# Patient Record
Sex: Male | Born: 1955 | Race: White | Hispanic: No | Marital: Married | Smoking: Former smoker
Health system: Southern US, Community
[De-identification: ages and names within clinical notes are randomized; demographics above are authoritative.]

## PROBLEM LIST (undated history)

## (undated) DIAGNOSIS — I4891 Unspecified atrial fibrillation: Secondary | ICD-10-CM

## (undated) DIAGNOSIS — R569 Unspecified convulsions: Secondary | ICD-10-CM

## (undated) DIAGNOSIS — I519 Heart disease, unspecified: Secondary | ICD-10-CM

## (undated) DIAGNOSIS — F0390 Unspecified dementia without behavioral disturbance: Secondary | ICD-10-CM

## (undated) DIAGNOSIS — E878 Other disorders of electrolyte and fluid balance, not elsewhere classified: Secondary | ICD-10-CM

## (undated) HISTORY — DX: Heart disease, unspecified: I51.9

## (undated) HISTORY — DX: Other disorders of electrolyte and fluid balance, not elsewhere classified: E87.8

## (undated) HISTORY — PX: PACEMAKER IMPLANT: EP1218

## (undated) HISTORY — DX: Unspecified atrial fibrillation: I48.91

---

## 2013-11-08 ENCOUNTER — Encounter (INDEPENDENT_AMBULATORY_CARE_PROVIDER_SITE_OTHER): Payer: Self-pay | Admitting: *Deleted

## 2014-11-06 ENCOUNTER — Other Ambulatory Visit (HOSPITAL_COMMUNITY): Payer: Self-pay | Admitting: Family Medicine

## 2014-11-06 ENCOUNTER — Ambulatory Visit (HOSPITAL_COMMUNITY)
Admission: RE | Admit: 2014-11-06 | Discharge: 2014-11-06 | Disposition: A | Discharge status: Home / Self Care | Payer: Medicare HMO | Source: Ambulatory Visit | Attending: Family Medicine | Admitting: Family Medicine

## 2014-11-06 DIAGNOSIS — M545 Low back pain, unspecified: Secondary | ICD-10-CM

## 2014-11-06 DIAGNOSIS — G8929 Other chronic pain: Secondary | ICD-10-CM

## 2014-11-09 ENCOUNTER — Encounter (INDEPENDENT_AMBULATORY_CARE_PROVIDER_SITE_OTHER): Payer: Self-pay | Admitting: *Deleted

## 2016-05-19 ENCOUNTER — Ambulatory Visit (INDEPENDENT_AMBULATORY_CARE_PROVIDER_SITE_OTHER): Payer: Medicare HMO | Admitting: Otolaryngology

## 2016-05-19 DIAGNOSIS — H903 Sensorineural hearing loss, bilateral: Secondary | ICD-10-CM

## 2016-05-19 DIAGNOSIS — H9313 Tinnitus, bilateral: Secondary | ICD-10-CM

## 2017-04-22 ENCOUNTER — Other Ambulatory Visit (HOSPITAL_COMMUNITY): Payer: Self-pay | Admitting: Family Medicine

## 2017-04-22 DIAGNOSIS — M545 Low back pain: Secondary | ICD-10-CM

## 2017-04-28 ENCOUNTER — Encounter (HOSPITAL_COMMUNITY): Payer: Self-pay

## 2017-04-28 ENCOUNTER — Ambulatory Visit (HOSPITAL_COMMUNITY): Payer: Medicare HMO

## 2017-05-25 ENCOUNTER — Ambulatory Visit (INDEPENDENT_AMBULATORY_CARE_PROVIDER_SITE_OTHER): Payer: Medicare HMO | Admitting: Otolaryngology

## 2017-06-17 ENCOUNTER — Other Ambulatory Visit (HOSPITAL_COMMUNITY): Payer: Self-pay | Admitting: Family Medicine

## 2017-06-17 DIAGNOSIS — M5441 Lumbago with sciatica, right side: Secondary | ICD-10-CM

## 2017-06-17 DIAGNOSIS — M5442 Lumbago with sciatica, left side: Principal | ICD-10-CM

## 2017-07-03 ENCOUNTER — Ambulatory Visit (HOSPITAL_COMMUNITY)
Admission: RE | Admit: 2017-07-03 | Discharge: 2017-07-03 | Disposition: A | Discharge status: Home / Self Care | Payer: Medicare HMO | Source: Ambulatory Visit | Attending: Family Medicine | Admitting: Family Medicine

## 2017-07-03 ENCOUNTER — Encounter (HOSPITAL_COMMUNITY): Payer: Self-pay

## 2017-07-03 DIAGNOSIS — M5442 Lumbago with sciatica, left side: Secondary | ICD-10-CM | POA: Insufficient documentation

## 2017-07-03 DIAGNOSIS — M5441 Lumbago with sciatica, right side: Secondary | ICD-10-CM | POA: Diagnosis present

## 2017-07-03 DIAGNOSIS — M4854XA Collapsed vertebra, not elsewhere classified, thoracic region, initial encounter for fracture: Secondary | ICD-10-CM | POA: Diagnosis not present

## 2017-07-03 MED ORDER — IOPAMIDOL (ISOVUE-300) INJECTION 61%
100.0000 mL | Freq: Once | INTRAVENOUS | Status: AC | PRN
  Administered 2017-07-03: 100 mL via INTRAVENOUS

## 2018-08-21 ENCOUNTER — Telehealth: Payer: Self-pay

## 2018-08-21 DIAGNOSIS — Z20822 Contact with and (suspected) exposure to covid-19: Secondary | ICD-10-CM

## 2018-08-21 NOTE — Telephone Encounter (Signed)
Pt given appt for [facility] 08/23/18 @1300. Pt instructed on address, to wear mask to appt and stay in car.  

## 2018-08-23 ENCOUNTER — Other Ambulatory Visit: Payer: Medicare HMO

## 2018-09-12 ENCOUNTER — Telehealth: Payer: Self-pay

## 2018-09-12 DIAGNOSIS — Z20822 Contact with and (suspected) exposure to covid-19: Secondary | ICD-10-CM

## 2018-09-12 NOTE — Telephone Encounter (Signed)
Pt wanting testing Order placed.

## 2018-09-14 ENCOUNTER — Other Ambulatory Visit: Payer: Self-pay | Admitting: Internal Medicine

## 2018-09-14 DIAGNOSIS — Z20822 Contact with and (suspected) exposure to covid-19: Secondary | ICD-10-CM

## 2018-09-15 LAB — NOVEL CORONAVIRUS, NAA: SARS-CoV-2, NAA: NOT DETECTED

## 2019-06-20 ENCOUNTER — Other Ambulatory Visit: Payer: Self-pay

## 2019-06-20 ENCOUNTER — Encounter: Payer: Self-pay | Admitting: Urology

## 2019-06-20 ENCOUNTER — Ambulatory Visit (INDEPENDENT_AMBULATORY_CARE_PROVIDER_SITE_OTHER): Payer: Medicare HMO | Admitting: Urology

## 2019-06-20 VITALS — BP 126/77 | HR 73 | Temp 98.2°F | Ht 72.0 in | Wt 195.0 lb

## 2019-06-20 DIAGNOSIS — R972 Elevated prostate specific antigen [PSA]: Secondary | ICD-10-CM | POA: Diagnosis not present

## 2019-06-20 NOTE — Progress Notes (Signed)
   06/20/2019 11:39 AM   Gerlene Burdock Heinz Knuckles [birth year] [id number]  Referring provider: Oval Linsey, MD [id number] [place],  [place] [address]  Elevated PSA  HPI: Mr Jonathan Poole is a 64yo here for evaluation of elevated PSA. PSA 6.4 this year. Patient can not recall previous PSA values. No significant LUTS. No dysuria or hematuria. No family hx of prostate cancer.  Patient has a pacemaker. He is not on blood thinner.    PMH: Past Medical History:  Diagnosis Date  . Heart disease   . Hyperchloremia     Surgical History: Past Surgical History:  Procedure Laterality Date  . PACEMAKER IMPLANT      Home Medications:  Allergies as of 06/20/2019   No Known Allergies     Medication List    as of Jun 20, 2019 11:39 AM   You have not been prescribed any medications.     Allergies: No Known Allergies  Family History: Family History  Problem Relation Age of Onset  . Prostate cancer Father   . Heart disease Father     Social History:  reports that he quit smoking about 20 years ago. He has a 15.00 pack-year smoking history. He has never used smokeless tobacco. He reports current drug use. Drug: Marijuana. No history on file for alcohol.  ROS: All other review of systems were reviewed and are negative except what is noted above in HPI  Physical Exam: BP 126/77   Pulse 73   Temp 98.2 F (36.8 C)   Ht 6' (1.829 m)   Wt 195 lb (88.5 kg)   BMI 26.45 kg/m   Constitutional:  Alert and oriented, No acute distress. HEENT: Meta AT, moist mucus membranes.  Trachea midline, no masses. Cardiovascular: No clubbing, cyanosis, or edema. Respiratory: Normal respiratory effort, no increased work of breathing. GI: Abdomen is soft, nontender, nondistended, no abdominal masses GU: No CVA tenderness. Circumcised phallus. No masses/lesions on penis, testis, scrotum. Prostate 50g smooth no nodules no induration.  Lymph: No cervical or inguinal lymphadenopathy. Skin: No rashes,  bruises or suspicious lesions. Neurologic: Grossly intact, no focal deficits, moving all 4 extremities. Psychiatric: Normal mood and affect.  Laboratory Data: No results found for: WBC, HGB, HCT, MCV, PLT  No results found for: CREATININE  No results found for: PSA  No results found for: TESTOSTERONE  No results found for: HGBA1C  Urinalysis No results found for: COLORURINE, APPEARANCEUR, LABSPEC, PHURINE, GLUCOSEU, HGBUR, BILIRUBINUR, KETONESUR, PROTEINUR, UROBILINOGEN, NITRITE, LEUKOCYTESUR  No results found for: LABMICR, WBCUA, RBCUA, LABEPIT, MUCUS, BACTERIA  Pertinent Imaging:  No results found for this or any previous visit. No results found for this or any previous visit. No results found for this or any previous visit. No results found for this or any previous visit. No results found for this or any previous visit. No results found for this or any previous visit. No results found for this or any previous visit. No results found for this or any previous visit.  Assessment & Plan:    1. Elevated PSA -We discussed the natural history of elevated PSA and the various causes. We discussed prostate biopsy versus PSA surveillance and the patient elects for PSa surveillance. RTC 6 months for free and total PSA    No follow-ups on file.  Wilkie Aye, MD  [facility]

## 2019-06-20 NOTE — Patient Instructions (Signed)
Transrectal Ultrasound-Guided Prostate Biopsy This is a procedure to take samples of tissue from your prostate. Ultrasound images are used to guide the procedure. It is usually done to check for prostate cancer. What happens before the procedure? Staying hydrated Follow instructions about liquids. These may include:  Up to 2 hours before the procedure - you may drink clear liquids. This includes water, clear fruit juice, black coffee, and plain tea.  Eating and drinking restrictions Follow instructions about eating and drinking. These may include:  8 hours before the procedure - stop eating heavy meals or foods. This includes meat, fried foods, or fatty foods.  6 hours before the procedure - stop eating light meals or foods. This includes toast or cereal.  6 hours before the procedure - stop drinking milk or drinks that have milk.  2 hours before the procedure - stop drinking clear liquids. Medicines Ask your doctor about:  Changing or stopping your normal medicines. This is important if you take diabetes medicines or blood thinners.  Taking over-the-counter medicines, vitamins, herbs, and supplements.  Taking medicines such as aspirin and ibuprofen. These medicines can thin your blood. Do not take these medicines unless your doctor tells you to take them. General instructions  You may be given antibiotic medicine. If so, take it as told by your doctor.  Liquid will be used to clear waste from your butt (enema).  You may have a blood sample taken.  You may have a pee (urine) sample taken.  Plan to have someone take you home after your procedure. What happens during the procedure?   To lower your risk of infection: ? Your health care team will wash or sanitize their hands. ? Hair may be removed from the area. ? Your skin will be cleaned with soap. ? You will be given antibiotics.  An IV will be placed into one of your veins.  You will be given one or both of these: ? A  medicine to help you relax. ? A medicine to numb the area.  You will lie on your left side. Your knees will be bent.  A probe with gel on it will be placed in your butt. Pictures will be taken of your prostate and the area around it.  Medicine will be used to numb your prostate.  A needle will be placed in your butt and moved to your prostate.  Prostate tissue will be removed.  The samples will be sent to a lab. The procedure may vary. What happens after the procedure?  You will be watched until the medicines you were given have worn off.  You may have some pain in your butt. You will be given medicine for it. Summary  This procedure is usually done to check for prostate cancer.  Before the procedure, ask your doctor about changing or stopping your medicines.  You may have some pain in your butt. You will be given medicine for it.  Plan to have someone take you home after the procedure. This information is not intended to replace advice given to you by your health care provider. Make sure you discuss any questions you have with your health care provider. Document Revised: 05/19/2018 Document Reviewed: 04/25/2016 Elsevier Patient Education  2020 Elsevier Inc.  

## 2019-06-20 NOTE — Progress Notes (Signed)
Urological Symptom Review  Patient is experiencing the following symptoms: Getting up at night    Review of Systems  Gastrointestinal (upper)  : Negative for upper GI symptoms  Gastrointestinal (lower) : Negative for lower GI symptoms  Constitutional : Negative for symptoms  Skin: Negative for skin symptoms  Eyes: Negative for eye symptoms  Ear/Nose/Throat : Negative for Ear/Nose/Throat symptoms  Hematologic/Lymphatic: Negative for Hematologic/Lymphatic symptoms  Cardiovascular : Negative for cardiovascular symptoms  Respiratory : Negative for respiratory symptoms  Endocrine: Negative for endocrine symptoms  Musculoskeletal: Back pain Joint pain  Neurological: Negative for neurological symptoms  Psychologic: Negative for psychiatric symptoms

## 2019-06-25 IMAGING — CT CT L SPINE W/ CM
3 series · 10 of 33 positions shown, 11 images · IV contrast (iopamidol)
Comparison: Radiographs 11/06/2014

CLINICAL DATA: No hx of contrast Allergy, Not diabetic, no kidney
disease Chronic back pain with pain down both legs

EXAM:
CT LUMBAR SPINE WITH CONTRAST
TECHNIQUE: Multidetector CT imaging of the lumbar spine was performed with
intravenous contrast administration.
CONTRAST:  100mL KHCTPA-6WW IOPAMIDOL (KHCTPA-6WW) INJECTION 61%

[Series 5: l spine soft · axial · 0.29mm/px · z∈[+971,+1109]mm · 2 of 150 slices shown, 3 images]
[im 46/150  soft-tissue]
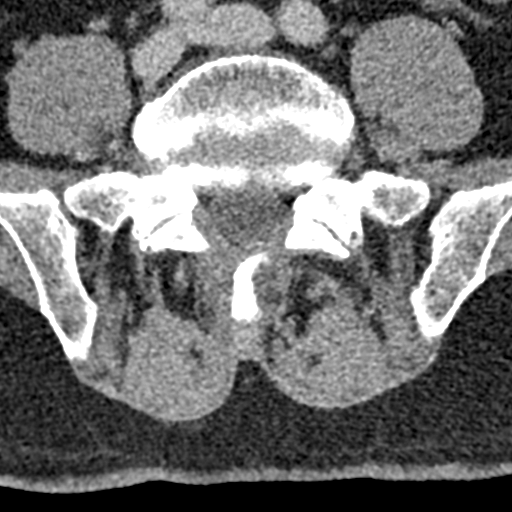
[im 46/150  bone]
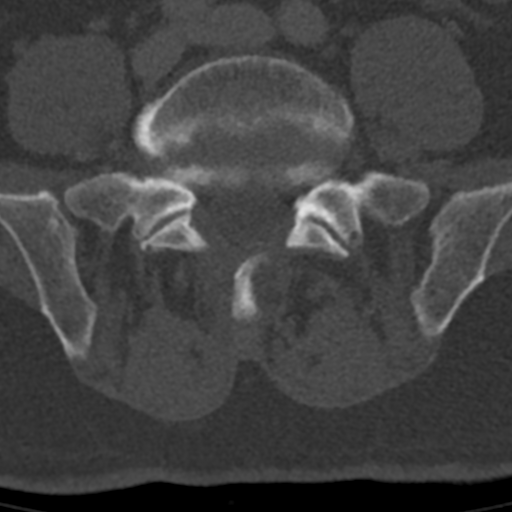
[im 115/150  bone]
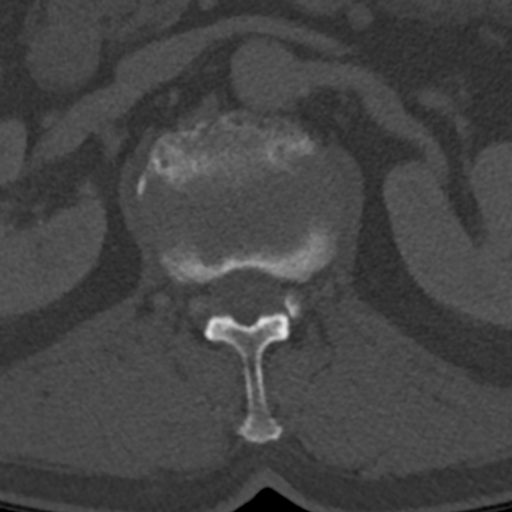

[Series 9: sagittal bone · sagittal · 0.32mm/px · 5 of 61 slices shown]
[im 21/61  bone]
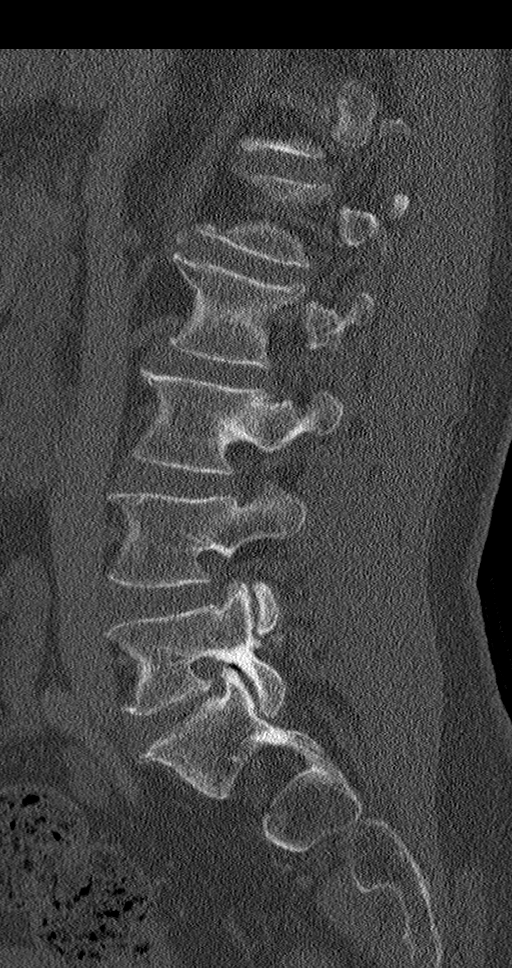
[im 26/61  bone]
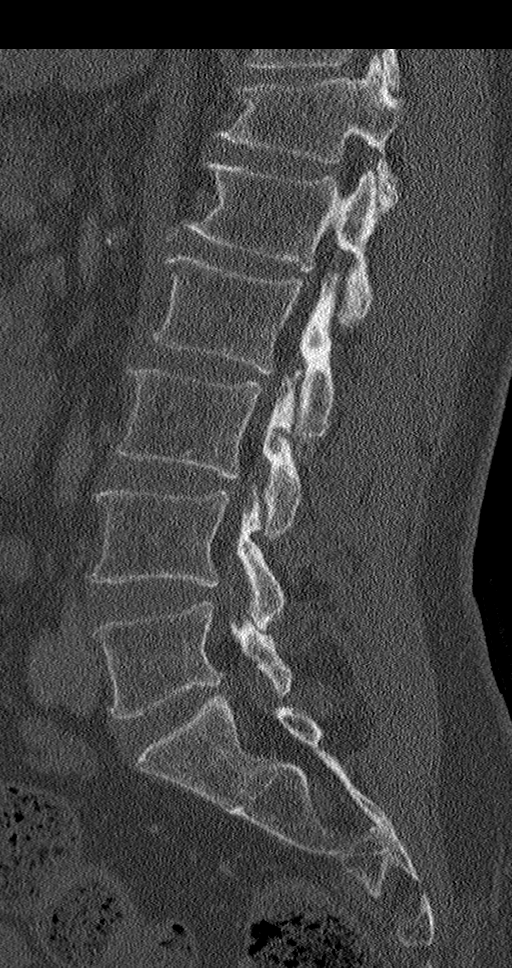
[im 31/61  bone]
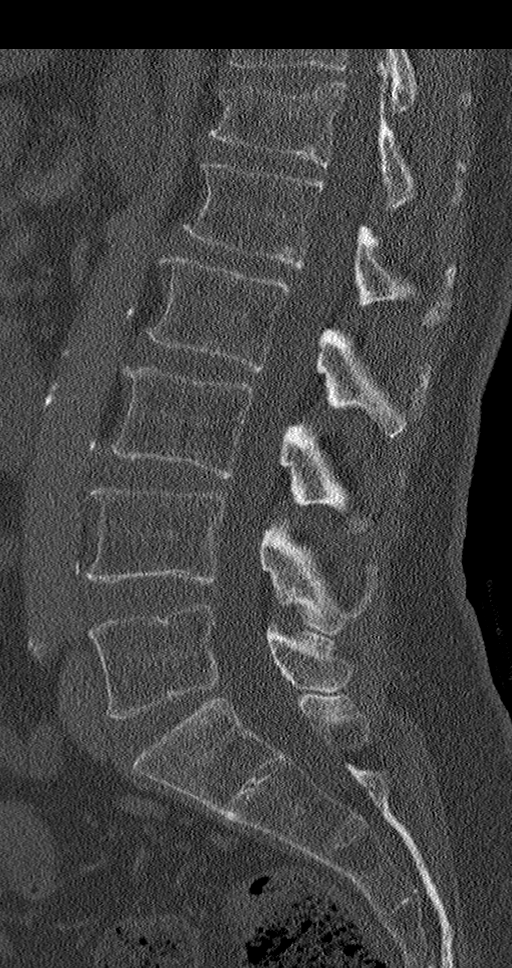
[im 36/61  bone]
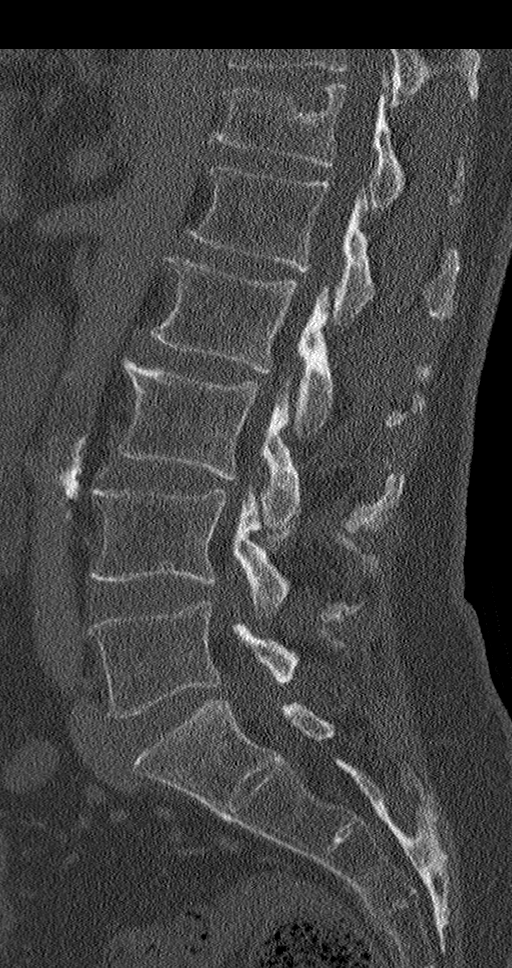
[im 41/61  bone]
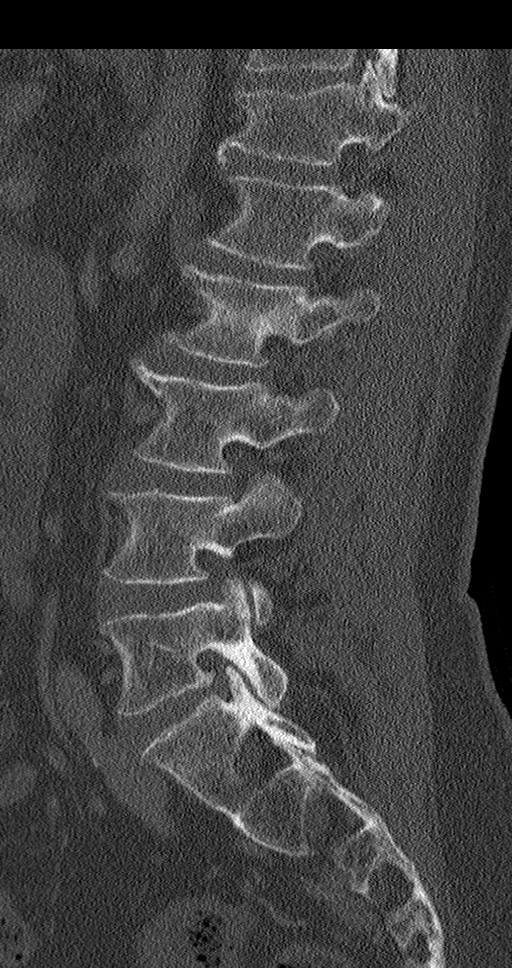

[Series 10: coronal bone · coronal · 0.23mm/px · 3 of 70 slices shown]
[im 14/70  bone]
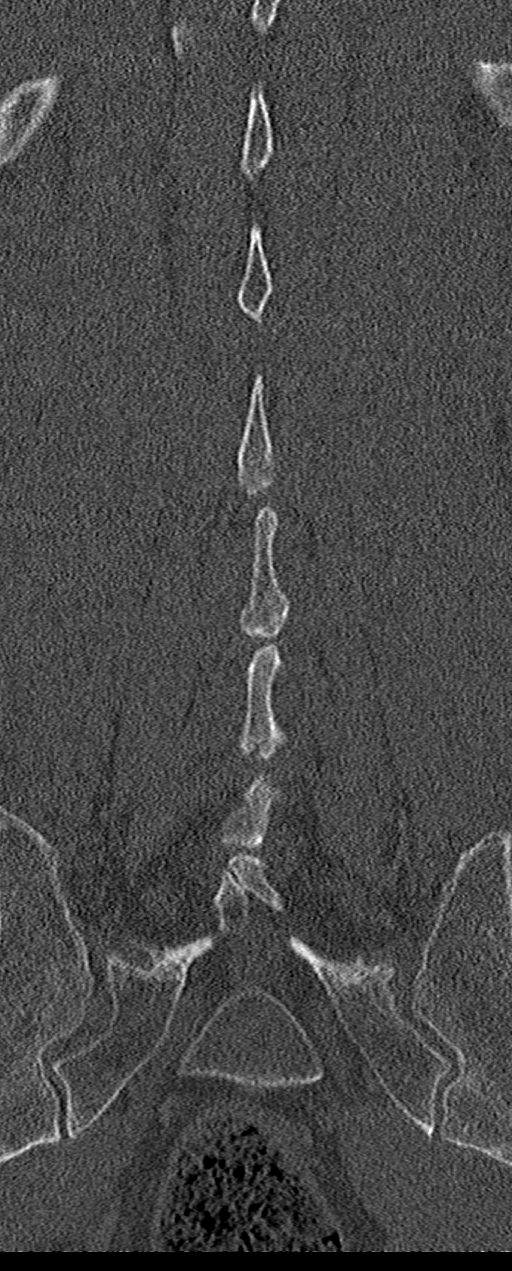
[im 28/70  bone]
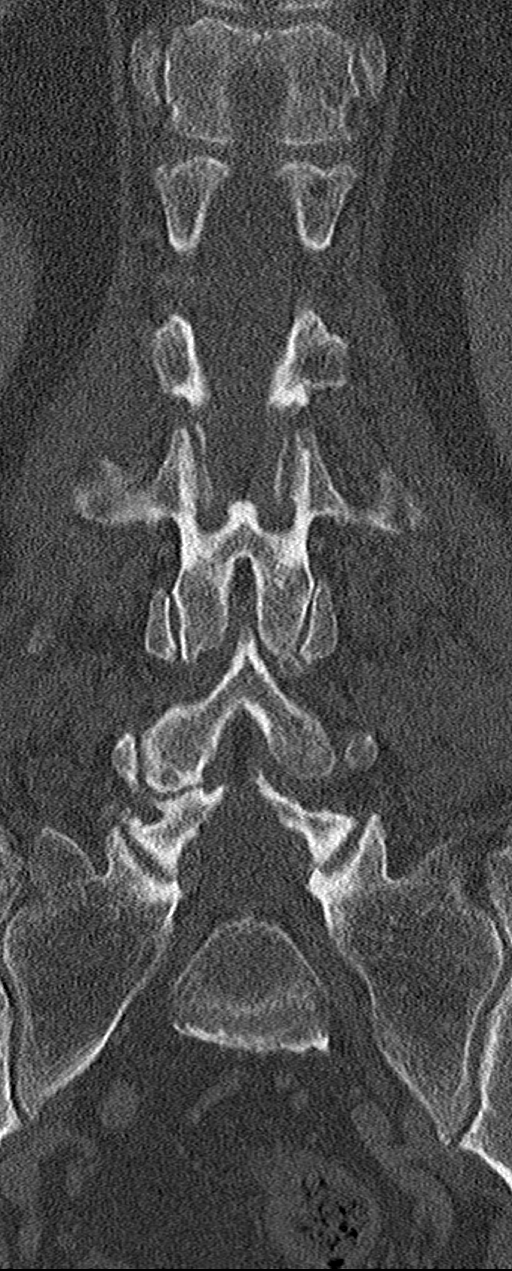
[im 42/70  bone]
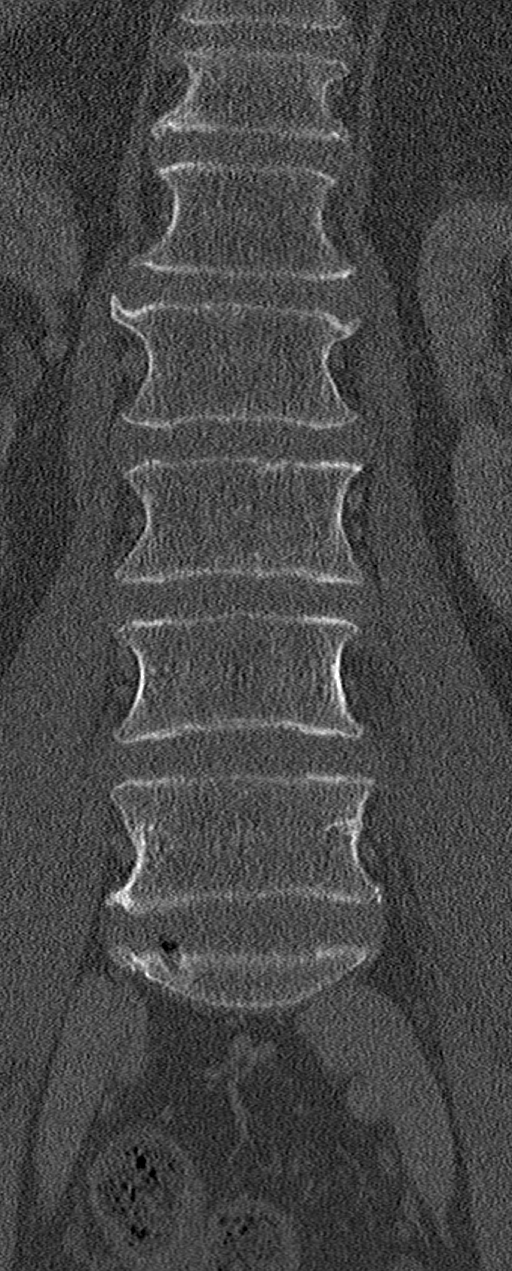

[10 of 33 positions shown; findings below may reference images not displayed]

FINDINGS: Segmentation: The 5 non rib-bearing lumbar segments are assigned
L1-L5.

Alignment: Normal

Vertebrae: Stable superior endplate Schmorl's node and moderate
compression deformity of T12 without definite acute features. No new
fracture. Lumbar vertebral body height maintained throughout.

Paraspinal and other soft tissues: Scattered aortoiliac calcified
plaque without suggestion of aneurysm. No areas of unexpected
enhancement from the IV contrast administration.

Disc levels:

T12-L1: Unremarkable

L1-2: Mild disc bulge with anterior plate spurring. No significant
foraminal or central canal stenosis.

L2-3: Mild circumferential disc bulge. No central canal or foraminal
stenosis.

L3-4: Mild circumferential disc bulge. No central canal or foraminal
stenosis.

L4-5: Circumferential disc bulge. Mild bilateral facet DJD right
worse than left. Central canal and foramina patent.

L5-S1: Mild bilateral facet DJD. Minimal disc bulge. Central canal
and foramina patent.
IMPRESSION: 1. Mild disc bulges L1-S1 without significant compressive pathology
evident.
2. Stable chronic appearing T12 compression fracture deformity.
3. Facet DJD L4-5 and L5-S1.

## 2019-12-07 ENCOUNTER — Encounter: Payer: Self-pay | Admitting: Gastroenterology

## 2019-12-12 ENCOUNTER — Encounter (HOSPITAL_COMMUNITY): Payer: Self-pay

## 2019-12-12 ENCOUNTER — Other Ambulatory Visit: Payer: Self-pay

## 2019-12-12 ENCOUNTER — Emergency Department (HOSPITAL_COMMUNITY)
Admission: EM | Admit: 2019-12-12 | Discharge: 2019-12-12 | Disposition: A | Discharge status: Home / Self Care | Payer: Medicare HMO | Attending: Emergency Medicine | Admitting: Emergency Medicine

## 2019-12-12 DIAGNOSIS — Z950 Presence of cardiac pacemaker: Secondary | ICD-10-CM | POA: Insufficient documentation

## 2019-12-12 DIAGNOSIS — Z87891 Personal history of nicotine dependence: Secondary | ICD-10-CM | POA: Insufficient documentation

## 2019-12-12 DIAGNOSIS — R42 Dizziness and giddiness: Secondary | ICD-10-CM | POA: Diagnosis present

## 2019-12-12 DIAGNOSIS — R55 Syncope and collapse: Secondary | ICD-10-CM | POA: Diagnosis not present

## 2019-12-12 DIAGNOSIS — R972 Elevated prostate specific antigen [PSA]: Secondary | ICD-10-CM

## 2019-12-12 LAB — CBC
HCT: 46.6 % (ref 39.0–52.0)
Hemoglobin: 14.9 g/dL (ref 13.0–17.0)
MCH: 28.7 pg (ref 26.0–34.0)
MCHC: 32 g/dL (ref 30.0–36.0)
MCV: 89.6 fL (ref 80.0–100.0)
Platelets: 195 10*3/uL (ref 150–400)
RBC: 5.2 MIL/uL (ref 4.22–5.81)
RDW: 12.6 % (ref 11.5–15.5)
WBC: 5.7 10*3/uL (ref 4.0–10.5)
nRBC: 0 % (ref 0.0–0.2)

## 2019-12-12 LAB — CBG MONITORING, ED: Glucose-Capillary: 98 mg/dL (ref 70–99)

## 2019-12-12 LAB — BASIC METABOLIC PANEL
Anion gap: 7 (ref 5–15)
BUN: 9 mg/dL (ref 8–23)
CO2: 25 mmol/L (ref 22–32)
Calcium: 9.1 mg/dL (ref 8.9–10.3)
Chloride: 107 mmol/L (ref 98–111)
Creatinine, Ser: 0.9 mg/dL (ref 0.61–1.24)
GFR, Estimated: >60 mL/min (ref >60)
Glucose, Bld: 99 mg/dL (ref 70–99)
Potassium: 4.4 mmol/L (ref 3.5–5.1)
Sodium: 139 mmol/L (ref 135–145)

## 2019-12-12 LAB — URINALYSIS, ROUTINE W REFLEX MICROSCOPIC
Bilirubin Urine: NEGATIVE
Glucose, UA: NEGATIVE mg/dL
Hgb urine dipstick: NEGATIVE
Ketones, ur: NEGATIVE mg/dL
Leukocytes,Ua: NEGATIVE
Nitrite: NEGATIVE
Protein, ur: NEGATIVE mg/dL
Specific Gravity, Urine: 1.005 (ref 1.005–1.030)
pH: 6 (ref 5.0–8.0)

## 2019-12-12 NOTE — ED Notes (Signed)
Pt was informed that we need a urine specimen.  

## 2019-12-12 NOTE — ED Provider Notes (Signed)
Emergency Department Provider Note   I have reviewed the triage vital signs and the nursing notes.   HISTORY  Chief Complaint Near Syncope   HPI Birney Belshe is a 64 y.o. male with past medical history of St. Jude's pacemaker and HLD presents to the emergency department for evaluation of lightheadedness.  Patient tells me that over the past week he has felt lightheadedness especially in the morning.  He has no associated chest pain, heart palpitations, shortness of breath.  He has not fully passed out.  He states he had a pacemaker in for approximately 10 years and was placed in [place].  He does not follow with a cardiologist locally but does see Dr. Janna Arch as his PCP.  He denies any new medications.  No sudden, severe headaches.  No radiation of symptoms or other modifying factors.  Past Medical History:  Diagnosis Date  . Heart disease   . Hyperchloremia     Patient Active Problem List   Diagnosis Date Noted  . Elevated PSA 06/20/2019    Past Surgical History:  Procedure Laterality Date  . PACEMAKER IMPLANT      Allergies Penicillins  Family History  Problem Relation Age of Onset  . Prostate cancer Father   . Heart disease Father     Social History Social History   Tobacco Use  . Smoking status: Former Smoker    Packs/day: 1.00    Years: 15.00    Pack years: 15.00    Quit date: 2001    Years since quitting: 20.8  . Smokeless tobacco: Never Used  Vaping Use  . Vaping Use: Some days  Substance Use Topics  . Alcohol use: Never  . Drug use: Not Currently    Types: Marijuana    Review of Systems  Constitutional: No fever/chills Eyes: No visual changes. ENT: No sore throat. Cardiovascular: Denies chest pain. Positive near syncope.  Respiratory: Denies shortness of breath. Gastrointestinal: No abdominal pain.  No nausea, no vomiting.  No diarrhea.  No constipation. Genitourinary: Negative for dysuria. Mild urine incontinence.    Musculoskeletal: Negative for back pain.  Skin: Negative for rash. Neurological: Negative for headaches, focal weakness or numbness.  10-point ROS otherwise negative.  ____________________________________________   PHYSICAL EXAM:  VITAL SIGNS: ED Triage Vitals [12/12/19 0910]  Enc Vitals Group     BP 123/83     Pulse Rate (!) 58     Resp 20     Temp 98.2 F (36.8 C)     Temp Source Oral     SpO2 99 %     Weight 180 lb (81.6 kg)     Height 6' (1.829 m)   Constitutional: Alert and oriented. Well appearing and in no acute distress. Eyes: Conjunctivae are normal.  Head: Atraumatic. Nose: No congestion/rhinnorhea. Mouth/Throat: Mucous membranes are moist.   Neck: No stridor.   Cardiovascular: Normal rate, regular rhythm. Good peripheral circulation. Grossly normal heart sounds.   Respiratory: Normal respiratory effort.  No retractions. Lungs CTAB. Gastrointestinal: Soft and nontender. No distention.  Musculoskeletal: No gross deformities of extremities. Neurologic:  Normal speech and language.  Skin:  Skin is warm, dry and intact. No rash noted.   ____________________________________________   LABS (all labs ordered are listed, but only abnormal results are displayed)  Labs Reviewed  URINALYSIS, ROUTINE W REFLEX MICROSCOPIC - Abnormal; Notable for the following components:      Result Value   Color, Urine STRAW (*)    All other components within  normal limits  BASIC METABOLIC PANEL  CBC  CBG MONITORING, ED   ____________________________________________  EKG   EKG Interpretation  Date/Time:  Monday December 12 2019 09:01:39 EDT Ventricular Rate:  59 PR Interval:  176 QRS Duration: 86 QT Interval:  452 QTC Calculation: 447 R Axis:   94 Text Interpretation: Atrial-paced rhythm Rightward axis Abnormal ECG No STEMI Confirmed by Alona Bene [id number]) on 12/12/2019 9:58:38 AM       ____________________________________________  RADIOLOGY  None   ____________________________________________   PROCEDURES  Procedure(s) performed:   Procedures  None  ____________________________________________   INITIAL IMPRESSION / ASSESSMENT AND PLAN / ED COURSE  Pertinent labs & imaging results that were available during my care of the patient were reviewed by me and considered in my medical decision making (see chart for details).   Patient presents emergency department with near syncope over the past week along with some urine incontinence.  No neurologic deficits on exam.  EKG shows no acute findings. Atrial pacer waves noted. UA ordered.   Labs and imaging are reassuring. No standing orthostatics entered by upon my re-evaluation the patient is feeling well and standing at bedside. Discussed his pacemaker interrogation of ERI battery voltage reached. No recorded events. No transmissions in 1 years. Discussed with Dr. Wyline Mood. He will arrange an appointment this week and can help coordinate with EP. No telemetry abnormality in the ED. Discussed f/u plan and ED return precautions in detail.  ____________________________________________  FINAL CLINICAL IMPRESSION(S) / ED DIAGNOSES  Final diagnoses:  Near syncope    Note:  This document was prepared using Dragon voice recognition software and may include unintentional dictation errors.  Alona Bene, MD, [facility]    Nonie Lochner, Arlyss Repress, MD 12/12/19 [phone]

## 2019-12-12 NOTE — ED Triage Notes (Signed)
Pt reports lightheadedness  For the past week and had near syncopal episode this morning.  Pt also says having problems controlling his bladder since yesterday.  Reports has a pacemaker and thinks it may not be working.

## 2019-12-12 NOTE — ED Notes (Addendum)
Cardiology called to give information on appointment for this week, Thursday 11/4 at [facility]. Pt given information and d/c home

## 2019-12-12 NOTE — Discharge Instructions (Signed)
You were seen in the emergency department today with symptoms of lightheadedness and almost passing out.  Your pacemaker is at the end of its battery life and will need to be replaced.  I have arranged for the cardiology service to call you and get you an appointment in the next 1 to 2 weeks.  I have listed their contact information on this form as well.  If you not hear from them in the next 1 to 2 hours please call the office to confirm your appointment.  If you develop any new or suddenly worsening symptoms such as passing out or chest pain you should return to the emergency department immediately.

## 2019-12-15 ENCOUNTER — Ambulatory Visit (INDEPENDENT_AMBULATORY_CARE_PROVIDER_SITE_OTHER): Payer: Medicare HMO | Admitting: Internal Medicine

## 2019-12-15 ENCOUNTER — Encounter: Payer: Self-pay | Admitting: *Deleted

## 2019-12-15 ENCOUNTER — Encounter: Payer: Self-pay | Admitting: Internal Medicine

## 2019-12-15 ENCOUNTER — Other Ambulatory Visit: Payer: Self-pay

## 2019-12-15 VITALS — BP 128/62 | HR 57 | Ht 72.0 in | Wt 189.0 lb

## 2019-12-15 DIAGNOSIS — I495 Sick sinus syndrome: Secondary | ICD-10-CM | POA: Diagnosis not present

## 2019-12-15 NOTE — Patient Instructions (Signed)
Medication Instructions:  Your physician recommends that you continue on your current medications as directed. Please refer to the Current Medication list given to you today.  *If you need a refill on your cardiac medications before your next appointment, please call your pharmacy*   Lab Work: Your physician recommends that you return for lab work in: Today   If you have labs (blood work) drawn today and your tests are completely normal, you will receive your results only by: Marland Kitchen MyChart Message (if you have MyChart) OR . A paper copy in the mail If you have any lab test that is abnormal or we need to change your treatment, we will call you to review the results.   Testing/Procedures: Your physician has recommended that you have a pacemaker inserted. A pacemaker is a small device that is placed under the skin of your chest or abdomen to help control abnormal heart rhythms. This device uses electrical pulses to prompt the heart to beat at a normal rate. Pacemakers are used to treat heart rhythms that are too slow. Wire (leads) are attached to the pacemaker that goes into the chambers of you heart. This is done in the hospital and usually requires and overnight stay. Please see the instruction sheet given to you today for more information.    Follow-Up: At [facility], you and your health needs are our priority.  As part of our continuing mission to provide you with exceptional heart care, we have created designated Provider Care Teams.  These Care Teams include your primary Cardiologist (physician) and Advanced Practice Providers (APPs -  Physician Assistants and Nurse Practitioners) who all work together to provide you with the care you need, when you need it.  We recommend signing up for the patient portal called "MyChart".  Sign up information is provided on this After Visit Summary.  MyChart is used to connect with patients for Virtual Visits (Telemedicine).  Patients are able to view  lab/test results, encounter notes, upcoming appointments, etc.  Non-urgent messages can be sent to your provider as well.   To learn more about what you can do with MyChart, go to [email or web address].    Your next appointment:    91 Days after Change out   The format for your next appointment:   In Person  Provider:   Lewayne Bunting, MD   Other Instructions Thank you for choosing [facility]!

## 2019-12-15 NOTE — H&P (View-Only) (Signed)
    HPI 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 He has moved to [place] from [place]. His device is at ERI. He has not had syncope. He denies chest pain or peripheral edema. Minimal dyspnea with exertion.  He has an old incision on the left side but his device was moved soon after initially placed to the right side.  Allergies  Allergen Reactions  . Penicillins      No current outpatient medications on file.   No current facility-administered medications for this visit.     Past Medical History:  Diagnosis Date  . Heart disease   . Hyperchloremia     ROS:   All systems reviewed and negative except as noted in the HPI.   Past Surgical History:  Procedure Laterality Date  . PACEMAKER IMPLANT       Family History  Problem Relation Age of Onset  . Prostate cancer Father   . Heart disease Father      Social History   Socioeconomic History  . Marital status: Married    Spouse name: Not on file  . Number of children: 3  . Years of education: Not on file  . Highest education level: Not on file  Occupational History  . Occupation: retired  Tobacco Use  . Smoking status: Former Smoker    Packs/day: 1.00    Years: 15.00    Pack years: 15.00    Quit date: 2001    Years since quitting: 20.8  . Smokeless tobacco: Never Used  Vaping Use  . Vaping Use: Some days  Substance and Sexual Activity  . Alcohol use: Never  . Drug use: Not Currently    Types: Marijuana  . Sexual activity: Not on file  Other Topics Concern  . Not on file  Social History Narrative  . Not on file   Social Determinants of Health   Financial Resource Strain:   . Difficulty of Paying Living Expenses: Not on file  Food Insecurity:   . Worried About Running Out of Food in the Last Year: Not on file  . Ran Out of Food in the Last Year: Not on file  Transportation Needs:   . Lack of Transportation (Medical): Not on file  . Lack of  Transportation (Non-Medical): Not on file  Physical Activity:   . Days of Exercise per Week: Not on file  . Minutes of Exercise per Session: Not on file  Stress:   . Feeling of Stress : Not on file  Social Connections:   . Frequency of Communication with Friends and Family: Not on file  . Frequency of Social Gatherings with Friends and Family: Not on file  . Attends Religious Services: Not on file  . Active Member of Clubs or Organizations: Not on file  . Attends Club or Organization Meetings: Not on file  . Marital Status: Not on file  Intimate Partner Violence:   . Fear of Current or Ex-Partner: Not on file  . Emotionally Abused: Not on file  . Physically Abused: Not on file  . Sexually Abused: Not on file     BP 128/62   Pulse (!) 57   Ht 6' (1.829 m)   Wt 189 lb (85.7 kg)   SpO2 95%   BMI 25.63 kg/m   Physical Exam:  Well appearing NAD HEENT: Unremarkable Neck:  No JVD, no thyromegally Lymphatics:  No adenopathy Back:  No CVA tenderness Lungs:  Clear   with no wheezes HEART:  Regular rate rhythm, no murmurs, no rubs, no clicks Abd:  soft, positive bowel sounds, no organomegally, no rebound, no guarding Ext:  2 plus pulses, no edema, no cyanosis, no clubbing Skin:  No rashes no nodules Neuro:  CN II through XII intact, motor grossly intact  DEVICE  Normal device function.  See PaceArt for details. ERI  Assess/Plan: 1. Sinus node dysfunction - he is stable s/p PPM insertion 2. PPM - he has reached ERI and will undergo PM gen change out in the next couple of weeks.   Sharlot Gowda Jasean Ambrosia,MD

## 2019-12-15 NOTE — Progress Notes (Signed)
HPI 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 He has moved to [facility] from [place]. His device is at [facility]. He has not had syncope. He denies chest pain or peripheral edema. Minimal dyspnea with exertion.  He has an old incision on the left side but his device was moved soon after initially placed to the right side.  Allergies  Allergen Reactions  . Penicillins      No current outpatient medications on file.   No current facility-administered medications for this visit.     Past Medical History:  Diagnosis Date  . Heart disease   . Hyperchloremia     ROS:   All systems reviewed and negative except as noted in the HPI.   Past Surgical History:  Procedure Laterality Date  . PACEMAKER IMPLANT       Family History  Problem Relation Age of Onset  . Prostate cancer Father   . Heart disease Father      Social History   Socioeconomic History  . Marital status: Married    Spouse name: Not on file  . Number of children: 3  . Years of education: Not on file  . Highest education level: Not on file  Occupational History  . Occupation: retired  Tobacco Use  . Smoking status: Former Smoker    Packs/day: 1.00    Years: 15.00    Pack years: 15.00    Quit date: 2001    Years since quitting: 20.8  . Smokeless tobacco: Never Used  Vaping Use  . Vaping Use: Some days  Substance and Sexual Activity  . Alcohol use: Never  . Drug use: Not Currently    Types: Marijuana  . Sexual activity: Not on file  Other Topics Concern  . Not on file  Social History Narrative  . Not on file   Social Determinants of Health   Financial Resource Strain:   . Difficulty of Paying Living Expenses: Not on file  Food Insecurity:   . Worried About Programme researcher, broadcasting/film/video in the Last Year: Not on file  . Ran Out of Food in the Last Year: Not on file  Transportation Needs:   . Lack of Transportation (Medical): Not on file  . Lack of  Transportation (Non-Medical): Not on file  Physical Activity:   . Days of Exercise per Week: Not on file  . Minutes of Exercise per Session: Not on file  Stress:   . Feeling of Stress : Not on file  Social Connections:   . Frequency of Communication with Friends and Family: Not on file  . Frequency of Social Gatherings with Friends and Family: Not on file  . Attends Religious Services: Not on file  . Active Member of Clubs or Organizations: Not on file  . Attends Banker Meetings: Not on file  . Marital Status: Not on file  Intimate Partner Violence:   . Fear of Current or Ex-Partner: Not on file  . Emotionally Abused: Not on file  . Physically Abused: Not on file  . Sexually Abused: Not on file     BP 128/62   Pulse (!) 57   Ht 6' (1.829 m)   Wt 189 lb (85.7 kg)   SpO2 95%   BMI 25.63 kg/m   Physical Exam:  Well appearing NAD HEENT: Unremarkable Neck:  No JVD, no thyromegally Lymphatics:  No adenopathy Back:  No CVA tenderness Lungs:  Clear  with no wheezes HEART:  Regular rate rhythm, no murmurs, no rubs, no clicks Abd:  soft, positive bowel sounds, no organomegally, no rebound, no guarding Ext:  2 plus pulses, no edema, no cyanosis, no clubbing Skin:  No rashes no nodules Neuro:  CN II through XII intact, motor grossly intact  DEVICE  Normal device function.  See PaceArt for details. ERI  Assess/Plan: 1. Sinus node dysfunction - he is stable s/p PPM insertion 2. PPM - he has reached ERI and will undergo PM gen change out in the next couple of weeks.   Jonathan Poole Jonathan Demeter,MD

## 2019-12-19 ENCOUNTER — Telehealth: Payer: Self-pay | Admitting: Internal Medicine

## 2019-12-19 NOTE — Telephone Encounter (Signed)
New message    Patient called wants to know the cost of the Gen change, what type of device are you replacing the old one with?  Will it be a smart device that he will be able to download from home and transmit?   Please call I gave him the number to billing to get the charges.

## 2019-12-19 NOTE — Telephone Encounter (Signed)
Returned call to pt.  Advised he would be receiving a SJ pacemaker to replace his current SJ pacemaker.  Advised he would have a bedside monitor for remote monitoring.  Further questions related to cost.  Will contact precert.

## 2019-12-21 ENCOUNTER — Ambulatory Visit: Payer: Medicare HMO | Admitting: Urology

## 2019-12-21 DIAGNOSIS — R972 Elevated prostate specific antigen [PSA]: Secondary | ICD-10-CM

## 2019-12-27 ENCOUNTER — Other Ambulatory Visit (HOSPITAL_COMMUNITY): Payer: Medicare HMO

## 2019-12-27 ENCOUNTER — Telehealth: Payer: Self-pay | Admitting: Internal Medicine

## 2019-12-27 NOTE — Telephone Encounter (Signed)
Per wife patient is having memory issues , will he be effected by anesthesia

## 2019-12-27 NOTE — Telephone Encounter (Signed)
New message     Patient wife called and she is canceling his procedure until the new year, she wants to talk to Dr Ladona Ridgel before he has this done .

## 2019-12-27 NOTE — Telephone Encounter (Signed)
I will forward to Dr.Taylor to address the anesthesia issue with wife and also discuss his memory issues.

## 2019-12-28 NOTE — Progress Notes (Signed)
Attempted to call patient regarding instructions for tomorrows procedure.  Unable to leave message, mailbox full

## 2019-12-29 ENCOUNTER — Ambulatory Visit (HOSPITAL_COMMUNITY): Admission: RE | Admit: 2019-12-29 | Payer: Medicare HMO | Source: Home / Self Care | Admitting: Internal Medicine

## 2019-12-29 ENCOUNTER — Encounter (HOSPITAL_COMMUNITY): Admission: RE | Payer: Self-pay | Source: Home / Self Care

## 2019-12-29 SURGERY — PPM GENERATOR CHANGEOUT

## 2019-12-30 NOTE — Telephone Encounter (Signed)
I called the patient's wife today and explained that we will not use any anesthesia. That we have no way of knowing how dependent he is on his PPM, I recommended that he come to the hospital early next week for PM generator change out.  Sharlot Gowda Armenta Erskin,MD

## 2019-12-31 ENCOUNTER — Other Ambulatory Visit (HOSPITAL_COMMUNITY): Payer: Medicare HMO

## 2019-12-31 ENCOUNTER — Telehealth: Payer: Self-pay | Admitting: Physician Assistant

## 2019-12-31 NOTE — Telephone Encounter (Signed)
Patient is scheduled for [facility] Monday afternoon. He can't go to [employer] for COVID test today as his car broke down and does not have any other transportation for 35+ miles drive.   I have advised to follow the pacemaker instruction and office will call Monday morning for arrange rapid test which can be done at hospital prior to scheduled time.

## 2020-01-02 ENCOUNTER — Ambulatory Visit (HOSPITAL_COMMUNITY)
Admission: RE | Admit: 2020-01-02 | Discharge: 2020-01-02 | Disposition: A | Discharge status: Home / Self Care | Payer: Medicare HMO | Source: Other Acute Inpatient Hospital | Attending: Internal Medicine | Admitting: Internal Medicine

## 2020-01-02 ENCOUNTER — Encounter (HOSPITAL_COMMUNITY): Admission: RE | Disposition: A | Discharge status: Home / Self Care | Payer: Self-pay | Attending: Internal Medicine

## 2020-01-02 ENCOUNTER — Other Ambulatory Visit: Payer: Self-pay

## 2020-01-02 DIAGNOSIS — Z880 Allergy status to penicillin: Secondary | ICD-10-CM | POA: Insufficient documentation

## 2020-01-02 DIAGNOSIS — Z4502 Encounter for adjustment and management of automatic implantable cardiac defibrillator: Secondary | ICD-10-CM | POA: Diagnosis not present

## 2020-01-02 DIAGNOSIS — I495 Sick sinus syndrome: Secondary | ICD-10-CM

## 2020-01-02 DIAGNOSIS — Z87891 Personal history of nicotine dependence: Secondary | ICD-10-CM | POA: Insufficient documentation

## 2020-01-02 DIAGNOSIS — Z45010 Encounter for checking and testing of cardiac pacemaker pulse generator [battery]: Secondary | ICD-10-CM | POA: Diagnosis not present

## 2020-01-02 HISTORY — PX: PPM GENERATOR CHANGEOUT: EP1233

## 2020-01-02 SURGERY — PPM GENERATOR CHANGEOUT

## 2020-01-02 MED ORDER — SODIUM CHLORIDE 0.9 % IV SOLN
INTRAVENOUS | Status: DC | PRN
  Administered 2020-01-02: 500 mL

## 2020-01-02 MED ORDER — SODIUM CHLORIDE 0.9 % IV SOLN
80.0000 mg | INTRAVENOUS | Status: DC
  Filled 2020-01-02: qty 2

## 2020-01-02 MED ORDER — VANCOMYCIN HCL 1000 MG IV SOLR
INTRAVENOUS | Status: AC | PRN
  Administered 2020-01-02: 1000 mg via INTRAVENOUS

## 2020-01-02 MED ORDER — ONDANSETRON HCL 4 MG/2ML IJ SOLN: 4.0000 mg | Freq: Four times a day (QID) | INTRAMUSCULAR | Status: DC | PRN

## 2020-01-02 MED ORDER — LIDOCAINE HCL (PF) 1 % IJ SOLN
INTRAMUSCULAR | Status: DC | PRN
  Administered 2020-01-02: 40 mL

## 2020-01-02 MED ORDER — SODIUM CHLORIDE 0.9 % IV SOLN
INTRAVENOUS | Status: AC
  Filled 2020-01-02: qty 2

## 2020-01-02 MED ORDER — CHLORHEXIDINE GLUCONATE 4 % EX LIQD
4.0000 "application " | Freq: Once | CUTANEOUS | Status: DC
  Filled 2020-01-02: qty 60

## 2020-01-02 MED ORDER — ACETAMINOPHEN 325 MG PO TABS: 325.0000 mg | ORAL_TABLET | ORAL | Status: DC | PRN

## 2020-01-02 MED ORDER — VANCOMYCIN HCL IN DEXTROSE 1-5 GM/200ML-% IV SOLN
1000.0000 mg | INTRAVENOUS | Status: DC
  Filled 2020-01-02: qty 200

## 2020-01-02 MED ORDER — LIDOCAINE HCL (PF) 1 % IJ SOLN
INTRAMUSCULAR | Status: AC
  Filled 2020-01-02: qty 60

## 2020-01-02 MED ORDER — SODIUM CHLORIDE 0.9 % IV SOLN: INTRAVENOUS | Status: DC

## 2020-01-02 SURGICAL SUPPLY — 4 items
CABLE SURGICAL S-101-97-12 (CABLE) ×3 IMPLANT
PACEMAKER ASSURITY DR-RF (Pacemaker) ×3 IMPLANT
PAD PRO RADIOLUCENT 2001M-C (PAD) ×3 IMPLANT
TRAY PACEMAKER INSERTION (PACKS) ×3 IMPLANT

## 2020-01-02 NOTE — Interval H&P Note (Signed)
History and Physical Interval Note:  01/02/2020 2:58 PM  Jonathan Poole  has presented today for surgery, with the diagnosis of ERI.  The various methods of treatment have been discussed with the patient and family. After consideration of risks, benefits and other options for treatment, the patient has consented to  Procedure(s): PPM GENERATOR CHANGEOUT (N/A) as a surgical intervention.  The patient's history has been reviewed, patient examined, no change in status, stable for surgery.  I have reviewed the patient's chart and labs.  Questions were answered to the patient's satisfaction.     Lewayne Bunting

## 2020-01-02 NOTE — Telephone Encounter (Signed)
[name] in cath lab notified.  Cath lab will follow up on this

## 2020-01-02 NOTE — Discharge Instructions (Signed)
Post procedure care instructions °Keep incision clean and dry for 10 days. °No driving for 2 days.  °You can remove outer dressing tomorrow. °Leave steri-strips (little pieces of tape) on until seen in the office for wound check appointment. °Call the office ([phone]) for redness, drainage, swelling, or fever. ° °

## 2020-01-03 ENCOUNTER — Encounter (HOSPITAL_COMMUNITY): Payer: Self-pay | Admitting: Internal Medicine

## 2020-01-04 ENCOUNTER — Telehealth: Payer: Self-pay | Admitting: Internal Medicine

## 2020-01-04 NOTE — Telephone Encounter (Signed)
New Message     Patient wants to know if he is allowed to get a shower, do they need to change the bandage that covers his wound? What is the wound care procedure?

## 2020-01-04 NOTE — Telephone Encounter (Signed)
Patient contacted and questions answered concerning wound care. Remote monitor education, unable to send transmission, Technical brewer # provided. Confirmed appointment on 01/12/20 in [facility] device clinic.

## 2020-01-11 ENCOUNTER — Ambulatory Visit: Payer: Medicare HMO | Admitting: Gastroenterology

## 2020-01-12 ENCOUNTER — Other Ambulatory Visit: Payer: Self-pay

## 2020-01-12 ENCOUNTER — Ambulatory Visit (INDEPENDENT_AMBULATORY_CARE_PROVIDER_SITE_OTHER): Payer: Medicare HMO | Admitting: Emergency Medicine

## 2020-01-12 DIAGNOSIS — Z950 Presence of cardiac pacemaker: Secondary | ICD-10-CM

## 2020-01-12 DIAGNOSIS — I495 Sick sinus syndrome: Secondary | ICD-10-CM | POA: Diagnosis not present

## 2020-01-12 LAB — CUP PACEART INCLINIC DEVICE CHECK
Battery Remaining Longevity: 120 mo
Battery Voltage: 2.98 V
Brady Statistic RA Percent Paced: 98 %
Brady Statistic RV Percent Paced: 2.7 %
Date Time Interrogation Session: 20211202163700
Implantable Lead Implant Date: 20100504
Implantable Lead Implant Date: 20100524
Implantable Lead Location: 753859
Implantable Lead Location: 753860
Implantable Pulse Generator Implant Date: 20211122
Lead Channel Impedance Value: 425 Ohm
Lead Channel Impedance Value: 487.5 Ohm
Lead Channel Pacing Threshold Amplitude: 0.5 V
Lead Channel Pacing Threshold Amplitude: 1.25 V
Lead Channel Pacing Threshold Pulse Width: 0.5 ms
Lead Channel Pacing Threshold Pulse Width: 0.7 ms
Lead Channel Sensing Intrinsic Amplitude: 4.3 mV
Lead Channel Sensing Intrinsic Amplitude: 5 mV
Lead Channel Setting Pacing Amplitude: 1.5 V
Lead Channel Setting Pacing Amplitude: 1.5 V
Lead Channel Setting Pacing Pulse Width: 0.7 ms
Lead Channel Setting Sensing Sensitivity: 0.7 mV
Pulse Gen Model: 2272
Pulse Gen Serial Number: 3864825

## 2020-01-12 NOTE — Progress Notes (Signed)
Wound check appointment. Steri-strips removed. Wound without redness or edema. Incision edges approximated, wound well healed. Normal device function. Thresholds, sensing, and impedances consistent with implant measurements. Device programmed at chronic settings due to mature leads. Histogram distribution appropriate for patient and level of activity. No mode switches or high ventricular rates noted. Patient educated about wound care, arm mobility, lifting restrictions. ROV with Dr Ladona Ridgel in [place] 04/10/2020. Enrolled in remote follow-up and next remote 04/04/19.

## 2020-01-31 ENCOUNTER — Other Ambulatory Visit: Payer: Self-pay | Admitting: Internal Medicine

## 2020-02-13 ENCOUNTER — Telehealth: Payer: Self-pay | Admitting: Internal Medicine

## 2020-02-13 NOTE — Telephone Encounter (Signed)
Pt c/o BP issue:  1. What are your last 5 BP readings?  122/94- 01/1 60 HR  64/62-01/02 65 HR   2. Are you having any other symptoms (ex. Dizziness, headache, blurred vision, passed out)? Feeling more tired, pt stated he does sleep a lot more  .  Denies any other symptoms    Pt had a pacemaker put in in nov.    Best number [phone]  [phone]- wife

## 2020-02-13 NOTE — Telephone Encounter (Signed)
Returned call regarding low BP reading, spoke with Wife.  Wife expressed that low reading was a one time occurrence.  BP checked today while I was on phone 1st reading BP120/89 HR 84 2nd reading 125/79-106. Told wife reading were acceptable.  I reiterated proper placement of BP cuff and pt position. Wife expressed she understands teachings.  Wife concerned  patient complains that he feels increased tiredness since PPM insertion in Nov.  Forwarding to Dr. Lubertha Basque nurse.

## 2020-02-14 ENCOUNTER — Other Ambulatory Visit: Payer: Medicare HMO

## 2020-02-14 NOTE — Telephone Encounter (Signed)
Scheduled with EP APP to assess.  Pt is not on blood pressure medication

## 2020-02-15 ENCOUNTER — Other Ambulatory Visit: Payer: Self-pay

## 2020-02-15 ENCOUNTER — Ambulatory Visit (INDEPENDENT_AMBULATORY_CARE_PROVIDER_SITE_OTHER): Payer: Medicare HMO | Admitting: Internal Medicine

## 2020-02-15 ENCOUNTER — Encounter: Payer: Medicare HMO | Admitting: Internal Medicine

## 2020-02-15 ENCOUNTER — Encounter: Payer: Self-pay | Admitting: Internal Medicine

## 2020-02-15 VITALS — BP 128/64 | HR 66 | Ht 72.0 in | Wt 190.0 lb

## 2020-02-15 DIAGNOSIS — I495 Sick sinus syndrome: Secondary | ICD-10-CM | POA: Diagnosis not present

## 2020-02-15 DIAGNOSIS — Z950 Presence of cardiac pacemaker: Secondary | ICD-10-CM | POA: Diagnosis not present

## 2020-02-15 LAB — CUP PACEART INCLINIC DEVICE CHECK
Battery Remaining Longevity: 121 mo
Battery Voltage: 3.02 V
Brady Statistic RA Percent Paced: 91 %
Brady Statistic RV Percent Paced: 2.7 %
Date Time Interrogation Session: 20220105150111
Implantable Lead Implant Date: 20100504
Implantable Lead Implant Date: 20100524
Implantable Lead Location: 753859
Implantable Lead Location: 753860
Implantable Pulse Generator Implant Date: 20211122
Lead Channel Impedance Value: 450 Ohm
Lead Channel Impedance Value: 487.5 Ohm
Lead Channel Pacing Threshold Amplitude: 0.5 V
Lead Channel Pacing Threshold Amplitude: 1.25 V
Lead Channel Pacing Threshold Pulse Width: 0.5 ms
Lead Channel Pacing Threshold Pulse Width: 0.7 ms
Lead Channel Sensing Intrinsic Amplitude: 4 mV
Lead Channel Sensing Intrinsic Amplitude: 5 mV
Lead Channel Setting Pacing Amplitude: 1.5 V
Lead Channel Setting Pacing Amplitude: 1.5 V
Lead Channel Setting Pacing Pulse Width: 0.7 ms
Lead Channel Setting Sensing Sensitivity: 0.7 mV
Pulse Gen Model: 2272
Pulse Gen Serial Number: 3864825

## 2020-02-15 NOTE — Patient Instructions (Signed)
Medication Instructions:  Your physician recommends that you continue on your current medications as directed. Please refer to the Current Medication list given to you today.  *If you need a refill on your cardiac medications before your next appointment, please call your pharmacy*   Lab Work: NONE   If you have labs (blood work) drawn today and your tests are completely normal, you will receive your results only by: . MyChart Message (if you have MyChart) OR . A paper copy in the mail If you have any lab test that is abnormal or we need to change your treatment, we will call you to review the results.   Testing/Procedures: NONE    Follow-Up: At [facility], you and your health needs are our priority.  As part of our continuing mission to provide you with exceptional heart care, we have created designated Provider Care Teams.  These Care Teams include your primary Cardiologist (physician) and Advanced Practice Providers (APPs -  Physician Assistants and Nurse Practitioners) who all work together to provide you with the care you need, when you need it.  We recommend signing up for the patient portal called "MyChart".  Sign up information is provided on this After Visit Summary.  MyChart is used to connect with patients for Virtual Visits (Telemedicine).  Patients are able to view lab/test results, encounter notes, upcoming appointments, etc.  Non-urgent messages can be sent to your provider as well.   To learn more about what you can do with MyChart, go to [email or web address].    Your next appointment:   1 year(s)  The format for your next appointment:   In Person  Provider:   Gregg Taylor, MD   Other Instructions Thank you for choosing [facility]!    

## 2020-02-15 NOTE — Progress Notes (Signed)
HPI Mr. 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 He has moved to [facility] from [place]. His device was at EOL and he underwent PM gen change out 2 months ago. In the interim, he notes fatigue. He has a h/o some depressed mood as well.  Allergies  Allergen Reactions  . Penicillins     Unknown reaction     Current Outpatient Medications  Medication Sig Dispense Refill  . acetaminophen (TYLENOL) 500 MG tablet Take 1,000 mg by mouth every 6 (six) hours as needed for moderate pain or headache.    . diphenhydrAMINE (BENADRYL) 25 MG tablet Take 25 mg by mouth daily as needed for allergies.    Marland Kitchen gabapentin (NEURONTIN) 300 MG capsule Take 300 mg by mouth 2 (two) times daily.    Marland Kitchen ibuprofen (ADVIL) 200 MG tablet Take 600-800 mg by mouth every 6 (six) hours as needed for headache or moderate pain.    Marland Kitchen Ketotifen Fumarate (ALLERGY EYE DROPS OP) Place 1 drop into both eyes daily as needed (allergies).    . Multiple Vitamin (MULTIVITAMIN WITH MINERALS) TABS tablet Take 2 tablets by mouth 4 (four) times a week.    Marland Kitchen oxyCODONE (OXY IR/ROXICODONE) 5 MG immediate release tablet Take 5 mg by mouth every 6 (six) hours as needed for severe pain.    . pravastatin (PRAVACHOL) 40 MG tablet Take 40 mg by mouth daily.     No current facility-administered medications for this visit.     Past Medical History:  Diagnosis Date  . Heart disease   . Hyperchloremia     ROS:   All systems reviewed and negative except as noted in the HPI.   Past Surgical History:  Procedure Laterality Date  . PACEMAKER IMPLANT    . PPM GENERATOR CHANGEOUT N/A 01/02/2020   Procedure: PPM GENERATOR CHANGEOUT;  Surgeon: Marinus Maw, MD;  Location: [facility] INVASIVE CV LAB;  Service: Cardiovascular;  Laterality: N/A;     Family History  Problem Relation Age of Onset  . Prostate cancer Father   . Heart disease Father      Social History   Socioeconomic History  . Marital status:  Married    Spouse name: Not on file  . Number of children: 3  . Years of education: Not on file  . Highest education level: Not on file  Occupational History  . Occupation: retired  Tobacco Use  . Smoking status: Former Smoker    Packs/day: 1.00    Years: 15.00    Pack years: 15.00    Quit date: 2001    Years since quitting: 21.0  . Smokeless tobacco: Never Used  Vaping Use  . Vaping Use: Some days  Substance and Sexual Activity  . Alcohol use: Never  . Drug use: Not Currently    Types: Marijuana  . Sexual activity: Not on file  Other Topics Concern  . Not on file  Social History Narrative  . Not on file   Social Determinants of Health   Financial Resource Strain: Not on file  Food Insecurity: Not on file  Transportation Needs: Not on file  Physical Activity: Not on file  Stress: Not on file  Social Connections: Not on file  Intimate Partner Violence: Not on file     BP 128/64   Pulse 66   Ht 6' (1.829 m)   Wt 190 lb (86.2 kg)   SpO2 95%   BMI 25.77 kg/m  Physical Exam:  Well appearing NAD HEENT: Unremarkable Neck:  No JVD, no thyromegally Lymphatics:  No adenopathy Back:  No CVA tenderness Lungs:  Clear with no wheezes HEART:  Regular rate rhythm, no murmurs, no rubs, no clicks Abd:  soft, positive bowel sounds, no organomegally, no rebound, no guarding Ext:  2 plus pulses, no edema, no cyanosis, no clubbing Skin:  No rashes no nodules Neuro:  CN II through XII intact, motor grossly intact  DEVICE  Normal device function.  See PaceArt for details.   Assess/Plan: 1. Sinus node dysfunction - he is asymptomatic, s/p PPM insertion. 2. PPM -his medtronic DDD PM is working normally. 3. Fatigue - we had turned his PM HR to 70. Rate response is turned on. 4. HTN -her bp appears to be well controlled. We will follow.  Sharlot Gowda Ojani Berenson,MD

## 2020-02-21 ENCOUNTER — Ambulatory Visit: Payer: Medicare HMO | Admitting: Urology

## 2020-02-28 ENCOUNTER — Encounter: Payer: Medicare HMO | Admitting: Student

## 2020-03-26 ENCOUNTER — Telehealth: Payer: Self-pay

## 2020-03-26 DIAGNOSIS — I4891 Unspecified atrial fibrillation: Secondary | ICD-10-CM

## 2020-03-26 DIAGNOSIS — I495 Sick sinus syndrome: Secondary | ICD-10-CM

## 2020-03-26 NOTE — Telephone Encounter (Signed)
Merlin alert received 1 event logged on 03/02/20, appears AF w/ RVR, duration of 5 hours 25 minutes. No OAC on file. Routing to Dr. Ladona Ridgel to advise if he would like to start [facility]? AT/AF burden <1%.

## 2020-03-27 NOTE — Telephone Encounter (Signed)
Start eliquis 5 bid. CBC/BMP in a month

## 2020-03-29 ENCOUNTER — Telehealth: Payer: Self-pay

## 2020-03-29 MED ORDER — APIXABAN 5 MG PO TABS: 5.0000 mg | ORAL_TABLET | Freq: Two times a day (BID) | ORAL | 3 refills | Status: DC

## 2020-03-29 NOTE — Telephone Encounter (Signed)
Patient called and advised to start Eliquis 5 mg po BID and blood work in 1 month. Questions answered.   Patient would like to have blood work completed in [place]. (CBC/BMP in 1 month)  Advised I will forward to [facility] Dr. Lubertha Basque nurse and she will contact them to set this up. Agreeable to plan.

## 2020-03-29 NOTE — Addendum Note (Signed)
Addended by: Roney Mans A on: 03/29/2020 01:07 PM   Modules accepted: Orders

## 2020-03-29 NOTE — Telephone Encounter (Signed)
LMOVM for pt to return my call. Dr. Ladona Ridgel nurse Boneta Lucks states the patient should go ahead and start the Eliquis and then in 1 month go to [facility] for blood work. The order is already in.

## 2020-03-30 NOTE — Telephone Encounter (Signed)
Contacted patient to confirm that he received message left yesterday concerning starting Eliquis and and blood work orders at [facility]. Patient confirmed that he did receive the message. Patient was advised again today to go ahead and start the Eliquis and then in 1 month to go to [facility] for blood work. Orders have already been entered. Patient verbalized understanding. Patient was advised that his Eliquis prescription was already sent in to [facility].

## 2020-04-03 ENCOUNTER — Ambulatory Visit (INDEPENDENT_AMBULATORY_CARE_PROVIDER_SITE_OTHER): Payer: Medicare HMO

## 2020-04-03 DIAGNOSIS — I495 Sick sinus syndrome: Secondary | ICD-10-CM

## 2020-04-03 LAB — CUP PACEART REMOTE DEVICE CHECK
Battery Remaining Longevity: 107 mo
Battery Remaining Percentage: 95.5 %
Battery Voltage: 3.02 V
Brady Statistic AP VP Percent: 6.4 %
Brady Statistic AP VS Percent: 91 %
Brady Statistic AS VP Percent: 1 %
Brady Statistic AS VS Percent: 3.1 %
Brady Statistic RA Percent Paced: 95 %
Brady Statistic RV Percent Paced: 6.4 %
Date Time Interrogation Session: 20220222040014
Implantable Lead Implant Date: 20100504
Implantable Lead Implant Date: 20100524
Implantable Lead Location: 753859
Implantable Lead Location: 753860
Implantable Pulse Generator Implant Date: 20211122
Lead Channel Impedance Value: 410 Ohm
Lead Channel Impedance Value: 480 Ohm
Lead Channel Pacing Threshold Amplitude: 0.5 V
Lead Channel Pacing Threshold Amplitude: 1.375 V
Lead Channel Pacing Threshold Pulse Width: 0.5 ms
Lead Channel Pacing Threshold Pulse Width: 0.7 ms
Lead Channel Sensing Intrinsic Amplitude: 3.1 mV
Lead Channel Sensing Intrinsic Amplitude: 5 mV
Lead Channel Setting Pacing Amplitude: 1.5 V
Lead Channel Setting Pacing Amplitude: 1.625
Lead Channel Setting Pacing Pulse Width: 0.7 ms
Lead Channel Setting Sensing Sensitivity: 0.7 mV
Pulse Gen Model: 2272
Pulse Gen Serial Number: 3864825

## 2020-04-10 ENCOUNTER — Other Ambulatory Visit: Payer: Self-pay

## 2020-04-10 ENCOUNTER — Encounter: Payer: Self-pay | Admitting: Internal Medicine

## 2020-04-10 ENCOUNTER — Ambulatory Visit (INDEPENDENT_AMBULATORY_CARE_PROVIDER_SITE_OTHER): Payer: Medicare HMO | Admitting: Internal Medicine

## 2020-04-10 ENCOUNTER — Encounter: Payer: Medicare HMO | Admitting: Internal Medicine

## 2020-04-10 VITALS — BP 114/70 | HR 88 | Ht 72.0 in | Wt 188.0 lb

## 2020-04-10 DIAGNOSIS — I495 Sick sinus syndrome: Secondary | ICD-10-CM

## 2020-04-10 LAB — CUP PACEART INCLINIC DEVICE CHECK
Battery Remaining Longevity: 111 mo
Battery Voltage: 3.01 V
Brady Statistic RA Percent Paced: 95 %
Brady Statistic RV Percent Paced: 6.4 %
Date Time Interrogation Session: 20220301135755
Implantable Lead Implant Date: 20100504
Implantable Lead Implant Date: 20100524
Implantable Lead Location: 753859
Implantable Lead Location: 753860
Implantable Pulse Generator Implant Date: 20211122
Lead Channel Impedance Value: 425 Ohm
Lead Channel Impedance Value: 512.5 Ohm
Lead Channel Pacing Threshold Amplitude: 0.5 V
Lead Channel Pacing Threshold Amplitude: 0.5 V
Lead Channel Pacing Threshold Pulse Width: 0.3 ms
Lead Channel Pacing Threshold Pulse Width: 0.5 ms
Lead Channel Sensing Intrinsic Amplitude: 4.8 mV
Lead Channel Sensing Intrinsic Amplitude: 5 mV
Lead Channel Setting Pacing Amplitude: 1.375
Lead Channel Setting Pacing Amplitude: 1.5 V
Lead Channel Setting Pacing Pulse Width: 0.7 ms
Lead Channel Setting Sensing Sensitivity: 0.7 mV
Pulse Gen Model: 2272
Pulse Gen Serial Number: 3864825

## 2020-04-10 NOTE — Patient Instructions (Signed)
Medication Instructions:  Your physician recommends that you continue on your current medications as directed. Please refer to the Current Medication list given to you today.  *If you need a refill on your cardiac medications before your next appointment, please call your pharmacy*   Lab Work: NONE   If you have labs (blood work) drawn today and your tests are completely normal, you will receive your results only by: . MyChart Message (if you have MyChart) OR . A paper copy in the mail If you have any lab test that is abnormal or we need to change your treatment, we will call you to review the results.   Testing/Procedures: NONE    Follow-Up: At [facility], you and your health needs are our priority.  As part of our continuing mission to provide you with exceptional heart care, we have created designated Provider Care Teams.  These Care Teams include your primary Cardiologist (physician) and Advanced Practice Providers (APPs -  Physician Assistants and Nurse Practitioners) who all work together to provide you with the care you need, when you need it.  We recommend signing up for the patient portal called "MyChart".  Sign up information is provided on this After Visit Summary.  MyChart is used to connect with patients for Virtual Visits (Telemedicine).  Patients are able to view lab/test results, encounter notes, upcoming appointments, etc.  Non-urgent messages can be sent to your provider as well.   To learn more about what you can do with MyChart, go to [email or web address].    Your next appointment:   1 year(s)  The format for your next appointment:   In Person  Provider:   Gregg Taylor, MD   Other Instructions Thank you for choosing [facility]!    

## 2020-04-10 NOTE — Progress Notes (Signed)
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 He has moved to [facility] from [place]. His device was at EOL and he underwent PM gen change out 4 months ago. In the interim, he notes fatigue. He has a h/o some depressed mood as well. His wife notes that he has some anxiety. He was found to have atrial fib but did not take the eliquis he was prescribed.   Allergies  Allergen Reactions  . Penicillins     Unknown reaction     Current Outpatient Medications  Medication Sig Dispense Refill  . acetaminophen (TYLENOL) 500 MG tablet Take 1,000 mg by mouth every 6 (six) hours as needed for moderate pain or headache.    Marland Kitchen apixaban (ELIQUIS) 5 MG TABS tablet Take 1 tablet (5 mg total) by mouth 2 (two) times daily. 60 tablet 3  . diphenhydrAMINE (BENADRYL) 25 MG tablet Take 25 mg by mouth daily as needed for allergies.    Marland Kitchen gabapentin (NEURONTIN) 300 MG capsule Take 300 mg by mouth 2 (two) times daily.    Marland Kitchen ibuprofen (ADVIL) 200 MG tablet Take 600-800 mg by mouth every 6 (six) hours as needed for headache or moderate pain.    Marland Kitchen Ketotifen Fumarate (ALLERGY EYE DROPS OP) Place 1 drop into both eyes daily as needed (allergies).    . Multiple Vitamin (MULTIVITAMIN WITH MINERALS) TABS tablet Take 2 tablets by mouth 4 (four) times a week.    Marland Kitchen oxyCODONE (OXY IR/ROXICODONE) 5 MG immediate release tablet Take 5 mg by mouth every 6 (six) hours as needed for severe pain.    . pravastatin (PRAVACHOL) 40 MG tablet Take 40 mg by mouth daily.     No current facility-administered medications for this visit.     Past Medical History:  Diagnosis Date  . Heart disease   . Hyperchloremia     ROS:   All systems reviewed and negative except as noted in the HPI.   Past Surgical History:  Procedure Laterality Date  . PACEMAKER IMPLANT    . PPM GENERATOR CHANGEOUT N/A 01/02/2020   Procedure: PPM GENERATOR CHANGEOUT;  Surgeon: Marinus Maw, MD;  Location: [facility] INVASIVE CV  LAB;  Service: Cardiovascular;  Laterality: N/A;     Family History  Problem Relation Age of Onset  . Prostate cancer Father   . Heart disease Father      Social History   Socioeconomic History  . Marital status: Married    Spouse name: Not on file  . Number of children: 3  . Years of education: Not on file  . Highest education level: Not on file  Occupational History  . Occupation: retired  Tobacco Use  . Smoking status: Former Smoker    Packs/day: 1.00    Years: 15.00    Pack years: 15.00    Quit date: 2001    Years since quitting: 21.1  . Smokeless tobacco: Never Used  Vaping Use  . Vaping Use: Some days  Substance and Sexual Activity  . Alcohol use: Never  . Drug use: Not Currently    Types: Marijuana  . Sexual activity: Not on file  Other Topics Concern  . Not on file  Social History Narrative  . Not on file   Social Determinants of Health   Financial Resource Strain: Not on file  Food Insecurity: Not on file  Transportation Needs: Not on file  Physical Activity: Not on file  Stress: Not on  file  Social Connections: Not on file  Intimate Partner Violence: Not on file     BP 114/70   Pulse 88   Ht 6' (1.829 m)   Wt 188 lb (85.3 kg)   SpO2 96%   BMI 25.50 kg/m   Physical Exam:  Well appearing NAD HEENT: Unremarkable Neck:  No JVD, no thyromegally Lymphatics:  No adenopathy Back:  No CVA tenderness Lungs:  Clear with no wheezes HEART:  Regular rate rhythm, no murmurs, no rubs, no clicks Abd:  soft, positive bowel sounds, no organomegally, no rebound, no guarding Ext:  2 plus pulses, no edema, no cyanosis, no clubbing Skin:  No rashes no nodules Neuro:  CN II through XII intact, motor grossly intact  DEVICE  Normal device function.  See PaceArt for details.   Assess/Plan: 1. PAF - I had a long explanation for the rationale for his systemic anti-coagulation. I think he will take the eliquis. 2. Sinus node dysfunction - he is  asymptomatic,s/p PPM 3. PPM - we reprogrammed his PPM today to maximize battery longevity. 4. Fatigue - we turned his rate up to 70/min. He is not sure it helped but we will leave it there. I strongly encouraged the patient to walk daily for 30 minutes.  Jonathan Poole Jonathan Saidi,MD

## 2020-04-11 NOTE — Progress Notes (Signed)
Remote pacemaker transmission.   

## 2020-04-12 ENCOUNTER — Other Ambulatory Visit: Payer: Self-pay

## 2020-04-12 ENCOUNTER — Telehealth: Payer: Self-pay

## 2020-04-12 ENCOUNTER — Ambulatory Visit (INDEPENDENT_AMBULATORY_CARE_PROVIDER_SITE_OTHER): Payer: Medicare HMO | Admitting: Internal Medicine

## 2020-04-12 ENCOUNTER — Encounter: Payer: Self-pay | Admitting: Internal Medicine

## 2020-04-12 VITALS — BP 133/90 | HR 103 | Temp 98.7°F | Resp 16 | Ht 72.0 in | Wt 192.0 lb

## 2020-04-12 DIAGNOSIS — I495 Sick sinus syndrome: Secondary | ICD-10-CM | POA: Diagnosis not present

## 2020-04-12 DIAGNOSIS — I480 Paroxysmal atrial fibrillation: Secondary | ICD-10-CM

## 2020-04-12 DIAGNOSIS — Z950 Presence of cardiac pacemaker: Secondary | ICD-10-CM | POA: Diagnosis not present

## 2020-04-12 DIAGNOSIS — E782 Mixed hyperlipidemia: Secondary | ICD-10-CM | POA: Insufficient documentation

## 2020-04-12 DIAGNOSIS — Z1211 Encounter for screening for malignant neoplasm of colon: Secondary | ICD-10-CM

## 2020-04-12 DIAGNOSIS — Z7689 Persons encountering health services in other specified circumstances: Secondary | ICD-10-CM

## 2020-04-12 DIAGNOSIS — F0280 Dementia in other diseases classified elsewhere without behavioral disturbance: Secondary | ICD-10-CM | POA: Insufficient documentation

## 2020-04-12 DIAGNOSIS — Z2820 Immunization not carried out because of patient decision for unspecified reason: Secondary | ICD-10-CM

## 2020-04-12 DIAGNOSIS — M5136 Other intervertebral disc degeneration, lumbar region: Secondary | ICD-10-CM

## 2020-04-12 DIAGNOSIS — G309 Alzheimer's disease, unspecified: Secondary | ICD-10-CM | POA: Insufficient documentation

## 2020-04-12 DIAGNOSIS — R972 Elevated prostate specific antigen [PSA]: Secondary | ICD-10-CM

## 2020-04-12 DIAGNOSIS — I482 Chronic atrial fibrillation, unspecified: Secondary | ICD-10-CM | POA: Insufficient documentation

## 2020-04-12 DIAGNOSIS — E785 Hyperlipidemia, unspecified: Secondary | ICD-10-CM | POA: Insufficient documentation

## 2020-04-12 DIAGNOSIS — R52 Pain, unspecified: Secondary | ICD-10-CM

## 2020-04-12 DIAGNOSIS — M51369 Other intervertebral disc degeneration, lumbar region without mention of lumbar back pain or lower extremity pain: Secondary | ICD-10-CM | POA: Insufficient documentation

## 2020-04-12 MED ORDER — DONEPEZIL HCL 5 MG PO TABS: 5.0000 mg | ORAL_TABLET | Freq: Every day | ORAL | 1 refills | Status: DC

## 2020-04-12 NOTE — Progress Notes (Signed)
New Patient Office Visit  Subjective:  Patient ID: Jonathan Poole, male    DOB: 08-04-1955  Age: 65 y.o. MRN: [id number]  CC:  Chief Complaint  Patient presents with  . New Patient (Initial Visit)    New patient former dr Cindie Laroche pt has had some memory loss has been going on for about 1 year. Feeling anxious on a daily basis for about a year also would like to discuss heart issues     HPI Jonathan Poole is a 65 year old male with PMH of sinus node dysfunction s/p pacemaker placement, PAF, HLD, DDD of lumbar spine and memory problem who presents for establishing care. He is a former patient of Dr Cindie Laroche.  He has h/o PPM for SA dysfunction, for which he follows up with Cardiologist. He also h/o PAF, and takes Eliquis. Not on any rate-controlling agent. Currently in sinus rhythm. He denies any headache, dizziness, chest pain, dyspnea or palpitations.  He has h/o DDD of lumbar spine, and has been on Oxycodone for pain management. He denies any numbness, tingling or weakness of LE.  He and his wife have been concerned about his memory as he forgets about the tasks that he goes out for. He forgets to bring the medications and groceries at times. He has been very nervous/anxious and angry at times. He does remember his address, has not had any episode of forgetting the street or getting lost. He is independent for his ADLs and IADLs. MMSE in the office today was 25/30. Denies any anhedonia, SI or HI.  He has not had COVID or flu vaccine.  Past Medical History:  Diagnosis Date  . Heart disease   . Hyperchloremia     Past Surgical History:  Procedure Laterality Date  . PACEMAKER IMPLANT    . PPM GENERATOR CHANGEOUT N/A 01/02/2020   Procedure: PPM GENERATOR CHANGEOUT;  Surgeon: Evans Lance, MD;  Location: [facility];  Service: Cardiovascular;  Laterality: N/A;    Family History  Problem Relation Age of Onset  . Prostate cancer Father   . Heart disease Father      Social History   Socioeconomic History  . Marital status: Married    Spouse name: Not on file  . Number of children: 3  . Years of education: Not on file  . Highest education level: Not on file  Occupational History  . Occupation: retired  Tobacco Use  . Smoking status: Former Smoker    Packs/day: 1.00    Years: 15.00    Pack years: 15.00    Quit date: 2001    Years since quitting: 21.1  . Smokeless tobacco: Never Used  Vaping Use  . Vaping Use: Some days  Substance and Sexual Activity  . Alcohol use: Never  . Drug use: Not Currently    Types: Marijuana  . Sexual activity: Not on file  Other Topics Concern  . Not on file  Social History Narrative  . Not on file   Social Determinants of Health   Financial Resource Strain: Not on file  Food Insecurity: Not on file  Transportation Needs: Not on file  Physical Activity: Not on file  Stress: Not on file  Social Connections: Not on file  Intimate Partner Violence: Not on file    ROS Review of Systems  Constitutional: Negative for chills and fever.  HENT: Negative for congestion and sore throat.   Eyes: Negative for pain and discharge.  Respiratory: Negative for cough and shortness  of breath.   Cardiovascular: Negative for chest pain and palpitations.  Gastrointestinal: Negative for constipation, diarrhea, nausea and vomiting.  Endocrine: Negative for polydipsia and polyuria.  Genitourinary: Negative for dysuria and hematuria.  Musculoskeletal: Positive for back pain. Negative for neck pain and neck stiffness.  Skin: Negative for rash.  Neurological: Negative for dizziness, weakness, numbness and headaches.  Psychiatric/Behavioral: Positive for sleep disturbance. Negative for agitation and behavioral problems. The patient is nervous/anxious.     Objective:   Today's Vitals: BP 133/90 (BP Location: Right Arm, Patient Position: Sitting, Cuff Size: Normal)   Pulse (!) 103   Temp 98.7 F (37.1 C) (Oral)    Resp 16   Ht 6' (1.829 m)   Wt 192 lb (87.1 kg)   SpO2 95%   BMI 26.04 kg/m   Physical Exam Vitals reviewed.  Constitutional:      General: He is not in acute distress.    Appearance: He is not diaphoretic.  HENT:     Head: Normocephalic and atraumatic.     Nose: Nose normal.     Mouth/Throat:     Mouth: Mucous membranes are moist.  Eyes:     General: No scleral icterus.    Extraocular Movements: Extraocular movements intact.     Pupils: Pupils are equal, round, and reactive to light.  Cardiovascular:     Rate and Rhythm: Normal rate and regular rhythm.     Pulses: Normal pulses.     Heart sounds: Normal heart sounds. No murmur heard.   Pulmonary:     Breath sounds: Normal breath sounds. No wheezing or rales.  Abdominal:     Palpations: Abdomen is soft.     Tenderness: There is no abdominal tenderness.  Musculoskeletal:     Cervical back: Neck supple. No tenderness.     Right lower leg: No edema.     Left lower leg: No edema.  Skin:    General: Skin is warm.     Findings: No rash.  Neurological:     General: No focal deficit present.     Mental Status: He is alert and oriented to person, place, and time.     Sensory: No sensory deficit.     Motor: No weakness.  Psychiatric:        Mood and Affect: Mood normal.        Behavior: Behavior normal.     Assessment & Plan:   Problem List Items Addressed This Visit      Encounter to establish care - Primary   Care established Previous chart reviewed History and medications reviewed with the patient     Relevant Orders  CBC  CMP14+EGFR  HgB A1c  Lipid panel  TSH + free T4  Vitamin D (25 hydroxy)    Cardiovascular and Mediastinum   Sinus node dysfunction (HCC)    S/p pacemaker placement Follows up with Cardiologist      PAF (paroxysmal atrial fibrillation) ([place])    H/o PAF On Eliquis Follows up with Cardiologist      Relevant Orders   CMP14+EGFR     Nervous and Auditory   Alzheimer's dementia  without behavioral disturbance ([place])    Early cognitive decline with concerns of anxiety and angry behavior at times MMSE: 25/30 Started Donepezil after discussion Will check response, if persistent anxiety, will start anxiolytic      Relevant Medications   donepezil (ARICEPT) 5 MG tablet   Other Relevant Orders   TSH + free T4  Musculoskeletal and Integument   DDD (degenerative disc disease), lumbar    Chronic low back pain Has been on Oxycodone 5 mg QID Referred to Dr Merlene Laughter for pain management        Other   Elevated PSA    Chart review suggests h/o elevated PSA, no urinary symptoms Check PSA.      Relevant Orders   PSA      H/O cardiac pacemaker   Relevant Orders   CMP14+EGFR   TSH + free T4   HLD (hyperlipidemia)    On Pravastatin Check lipid profile       Other Visit Diagnoses    Pain management       Relevant Orders   Ambulatory referral to Neurology   Special screening for malignant neoplasms, colon       Relevant Orders   Cologuard      Outpatient Encounter Medications as of 04/12/2020  Medication Sig  . acetaminophen (TYLENOL) 500 MG tablet Take 1,000 mg by mouth every 6 (six) hours as needed for moderate pain or headache.  Marland Kitchen apixaban (ELIQUIS) 5 MG TABS tablet Take 1 tablet (5 mg total) by mouth 2 (two) times daily.  Marland Kitchen donepezil (ARICEPT) 5 MG tablet Take 1 tablet (5 mg total) by mouth at bedtime.  Marland Kitchen ibuprofen (ADVIL) 200 MG tablet Take 600-800 mg by mouth every 6 (six) hours as needed for headache or moderate pain.  Marland Kitchen oxyCODONE (OXY IR/ROXICODONE) 5 MG immediate release tablet Take 5 mg by mouth every 6 (six) hours as needed for severe pain.  . pravastatin (PRAVACHOL) 40 MG tablet Take 40 mg by mouth daily.  Marland Kitchen gabapentin (NEURONTIN) 300 MG capsule Take 300 mg by mouth 2 (two) times daily. (Patient not taking: Reported on 04/12/2020)  . [DISCONTINUED] diphenhydrAMINE (BENADRYL) 25 MG tablet Take 25 mg by mouth daily as needed for allergies.  (Patient not taking: Reported on 04/12/2020)  . [DISCONTINUED] Ketotifen Fumarate (ALLERGY EYE DROPS OP) Place 1 drop into both eyes daily as needed (allergies). (Patient not taking: Reported on 04/12/2020)  . [DISCONTINUED] Multiple Vitamin (MULTIVITAMIN WITH MINERALS) TABS tablet Take 2 tablets by mouth 4 (four) times a week. (Patient not taking: Reported on 04/12/2020)   No facility-administered encounter medications on file as of 04/12/2020.    Follow-up: Return in about 3 months (around 07/13/2020).   Lindell Spar, MD

## 2020-04-12 NOTE — Patient Instructions (Signed)
Please start taking Donepezil as prescribed for memory problem.  Please continue to take other medications as prescribed.  You are being referred to Dr Gerilyn Pilgrim for pain management and dementia evaluation.  Please get fasting blood tests done before the next visit.

## 2020-04-12 NOTE — Assessment & Plan Note (Signed)
Early cognitive decline with concerns of anxiety and angry behavior at times MMSE: 25/30 Started Donepezil after discussion Will check response, if persistent anxiety, will start anxiolytic

## 2020-04-12 NOTE — Telephone Encounter (Signed)
Patient wife Jonathan Poole) called  can you please give her a call regarding anxiety and stress meds to be given now  for short term before his next appt. Contact patient wife [phone]

## 2020-04-12 NOTE — Assessment & Plan Note (Signed)
H/o PAF On Eliquis Follows up with Cardiologist

## 2020-04-12 NOTE — Assessment & Plan Note (Signed)
Chronic low back pain Has been on Oxycodone 5 mg QID Referred to Dr Gerilyn Pilgrim for pain management

## 2020-04-12 NOTE — Assessment & Plan Note (Signed)
On Pravastatin Check lipid profile 

## 2020-04-12 NOTE — Assessment & Plan Note (Addendum)
Chart review suggests h/o elevated PSA, no urinary symptoms Check PSA.

## 2020-04-12 NOTE — Telephone Encounter (Signed)
I had explained to them during the visit to wait till he starts taking Donepezil and we will discuss about other medications after few weeks if needed. They were counseled that I prefer not to start multiple medications at once.

## 2020-04-12 NOTE — Telephone Encounter (Signed)
Pt wife notified and he thought he had to wait 6 months for next visit before medication. Advised to start new medication and try this and after a few weeks if anxiety is no better please call and let us know with verbal understanding

## 2020-04-12 NOTE — Assessment & Plan Note (Signed)
Care established Previous chart reviewed History and medications reviewed with the patient 

## 2020-04-12 NOTE — Assessment & Plan Note (Signed)
S/p pacemaker placement Follows up with Cardiologist

## 2020-04-12 NOTE — Telephone Encounter (Signed)
We referred pt to [place] what would you advise pt was just saw this am as new pt

## 2020-06-06 ENCOUNTER — Telehealth: Payer: Self-pay

## 2020-06-06 NOTE — Telephone Encounter (Signed)
Jonathan Poole is calling, she states that the pt has been taking the medication for 2 months, and still is getting bouts of anxiety at times, fustration, can some Xanax be called in as needed

## 2020-06-06 NOTE — Telephone Encounter (Signed)
Make pt a phone visit please to discuss with provider

## 2020-06-07 NOTE — Telephone Encounter (Signed)
LVM to try to schedule appt

## 2020-06-08 ENCOUNTER — Telehealth: Payer: Self-pay

## 2020-06-08 NOTE — Telephone Encounter (Signed)
Patient left voicemail returning your call. Call back # [phone].

## 2020-06-11 NOTE — Telephone Encounter (Signed)
Pt needs a phone visit please look back at notes and schedule

## 2020-06-11 NOTE — Telephone Encounter (Signed)
Left voicemail to return call to schedule an appointment. 

## 2020-06-19 ENCOUNTER — Telehealth: Payer: Self-pay

## 2020-06-19 NOTE — Telephone Encounter (Signed)
LM for the pt to schedule Medicare Annual Wellness

## 2020-07-03 ENCOUNTER — Ambulatory Visit (INDEPENDENT_AMBULATORY_CARE_PROVIDER_SITE_OTHER): Payer: Medicare HMO

## 2020-07-03 DIAGNOSIS — I495 Sick sinus syndrome: Secondary | ICD-10-CM

## 2020-07-03 LAB — CUP PACEART REMOTE DEVICE CHECK
Battery Remaining Longevity: 116 mo
Battery Remaining Percentage: 95.5 %
Battery Voltage: 3.01 V
Brady Statistic AP VP Percent: 1 %
Brady Statistic AP VS Percent: 96 %
Brady Statistic AS VP Percent: 1 %
Brady Statistic AS VS Percent: 3 %
Brady Statistic RA Percent Paced: 95 %
Brady Statistic RV Percent Paced: 1 %
Date Time Interrogation Session: 20220524040014
Implantable Lead Implant Date: 20100504
Implantable Lead Implant Date: 20100524
Implantable Lead Location: 753859
Implantable Lead Location: 753860
Implantable Pulse Generator Implant Date: 20211122
Lead Channel Impedance Value: 450 Ohm
Lead Channel Impedance Value: 550 Ohm
Lead Channel Pacing Threshold Amplitude: 0.75 V
Lead Channel Pacing Threshold Amplitude: 1.25 V
Lead Channel Pacing Threshold Pulse Width: 0.3 ms
Lead Channel Pacing Threshold Pulse Width: 0.7 ms
Lead Channel Sensing Intrinsic Amplitude: 5 mV
Lead Channel Sensing Intrinsic Amplitude: 6.5 mV
Lead Channel Setting Pacing Amplitude: 1.5 V
Lead Channel Setting Pacing Amplitude: 1.5 V
Lead Channel Setting Pacing Pulse Width: 0.7 ms
Lead Channel Setting Sensing Sensitivity: 0.7 mV
Pulse Gen Model: 2272
Pulse Gen Serial Number: 3864825

## 2020-07-04 ENCOUNTER — Telehealth: Payer: Self-pay | Admitting: Internal Medicine

## 2020-07-04 NOTE — Telephone Encounter (Signed)
Per chart review:   Marinus Maw, MD  07/03/2020 8:08 PM EDT      Remote device check reviewed. Histograms appropriate. Leads and battery stable for patient. Follow up as outlined above. No recommended changes.

## 2020-07-04 NOTE — Telephone Encounter (Signed)
Pt spouse states she received a mychart alert about pt's device. Spouse is concerned and said the last time she received this message, pt had to have a new device placed.

## 2020-07-04 NOTE — Telephone Encounter (Signed)
Attempted to return call, no answer.  LVM advising that results were normal, provided callback number for device clinic if she should have additional concerns.

## 2020-07-04 NOTE — Telephone Encounter (Signed)
[name](wife) called questioning the pacemaker results in MY CHART for 5/24on her husband. She said it shows an alert in My CHART. She would like some to call them back about this.

## 2020-07-13 ENCOUNTER — Encounter: Payer: Self-pay | Admitting: Internal Medicine

## 2020-07-13 ENCOUNTER — Ambulatory Visit: Payer: Medicare HMO | Admitting: Internal Medicine

## 2020-07-13 ENCOUNTER — Other Ambulatory Visit: Payer: Self-pay

## 2020-07-13 VITALS — BP 130/87 | HR 105 | Temp 98.7°F | Resp 18 | Ht 72.0 in | Wt 182.1 lb

## 2020-07-13 DIAGNOSIS — F0280 Dementia in other diseases classified elsewhere without behavioral disturbance: Secondary | ICD-10-CM

## 2020-07-13 DIAGNOSIS — I480 Paroxysmal atrial fibrillation: Secondary | ICD-10-CM

## 2020-07-13 DIAGNOSIS — I495 Sick sinus syndrome: Secondary | ICD-10-CM

## 2020-07-13 DIAGNOSIS — G309 Alzheimer's disease, unspecified: Secondary | ICD-10-CM | POA: Diagnosis not present

## 2020-07-13 DIAGNOSIS — E782 Mixed hyperlipidemia: Secondary | ICD-10-CM

## 2020-07-13 DIAGNOSIS — M5136 Other intervertebral disc degeneration, lumbar region: Secondary | ICD-10-CM

## 2020-07-13 MED ORDER — DONEPEZIL HCL 5 MG PO TABS: 5.0000 mg | ORAL_TABLET | Freq: Every day | ORAL | 1 refills | Status: DC

## 2020-07-13 MED ORDER — PRAVASTATIN SODIUM 40 MG PO TABS: 40.0000 mg | ORAL_TABLET | Freq: Every day | ORAL | 5 refills | Status: DC

## 2020-07-13 MED ORDER — APIXABAN 5 MG PO TABS: 5.0000 mg | ORAL_TABLET | Freq: Two times a day (BID) | ORAL | 3 refills | Status: DC

## 2020-07-13 NOTE — Assessment & Plan Note (Signed)
Chronic low back pain Has been on Oxycodone 5 mg QID Referred to pain clinic for pain management  Patient states that he does not have Oxycodone. But PDMP suggests regular fill of Oxycodone - prescribed by other PCP. Advised patient to check with Pharmacy to make sure his medications are not being misused.

## 2020-07-13 NOTE — Assessment & Plan Note (Signed)
On Pravastatin Check lipid profile 

## 2020-07-13 NOTE — Progress Notes (Signed)
Established Patient Office Visit  Subjective:  Patient ID: Jonathan Poole, male    DOB: 06/22/1955  Age: 65 y.o. MRN: [id number]  CC:  Chief Complaint  Patient presents with  . Follow-up    3 month follow up     HPI [name] is a 65 year old male with PMH of sinus node dysfunction s/p pacemaker placement, PAF, HLD, DDD of lumbar spine and memory problem who presents for follow up of his chronic medical conditions.  PAF: He states that he does not have any medication. He is supposed to be on Eliquis, but states he does not have it. Denies any palpitations, chest pain or dyspnea currently.  HLD: Refilled Pravastatin.  Chronic pain: He was not able to see Dr Gerilyn Pilgrim. He has been referred to a different pain clinic. He states that he does not have pain medicine. But PDMP suggests recent fill of Oxycodone. Later, he mentioned that he will have to check his cabinets.  Patient has been seeing his old PCP as well and it appears that pain medications were prescribed by them. Patient is advised to see only one provider as PCP. It is made clear that we do not prescribe chronic pain medications - he expressed understanding.  Past Medical History:  Diagnosis Date  . Heart disease   . Hyperchloremia     Past Surgical History:  Procedure Laterality Date  . PACEMAKER IMPLANT    . PPM GENERATOR CHANGEOUT N/A 01/02/2020   Procedure: PPM GENERATOR CHANGEOUT;  Surgeon: Marinus Maw, MD;  Location: [facility];  Service: Cardiovascular;  Laterality: N/A;    Family History  Problem Relation Age of Onset  . Prostate cancer Father   . Heart disease Father     Social History   Socioeconomic History  . Marital status: Married    Spouse name: Not on file  . Number of children: 3  . Years of education: Not on file  . Highest education level: Not on file  Occupational History  . Occupation: retired  Tobacco Use  . Smoking status: Former Smoker    Packs/day: 1.00     Years: 15.00    Pack years: 15.00    Quit date: 2001    Years since quitting: 21.4  . Smokeless tobacco: Never Used  Vaping Use  . Vaping Use: Some days  Substance and Sexual Activity  . Alcohol use: Never  . Drug use: Not Currently    Types: Marijuana  . Sexual activity: Not on file  Other Topics Concern  . Not on file  Social History Narrative  . Not on file   Social Determinants of Health   Financial Resource Strain: Not on file  Food Insecurity: Not on file  Transportation Needs: Not on file  Physical Activity: Not on file  Stress: Not on file  Social Connections: Not on file  Intimate Partner Violence: Not on file    Outpatient Medications Prior to Visit  Medication Sig Dispense Refill  . acetaminophen (TYLENOL) 500 MG tablet Take 1,000 mg by mouth every 6 (six) hours as needed for moderate pain or headache.    . oxyCODONE (OXY IR/ROXICODONE) 5 MG immediate release tablet Take 5 mg by mouth every 6 (six) hours as needed for severe pain.    Marland Kitchen apixaban (ELIQUIS) 5 MG TABS tablet Take 1 tablet (5 mg total) by mouth 2 (two) times daily. 60 tablet 3  . donepezil (ARICEPT) 5 MG tablet Take 1 tablet (5 mg  total) by mouth at bedtime. 90 tablet 1  . ibuprofen (ADVIL) 200 MG tablet Take 600-800 mg by mouth every 6 (six) hours as needed for headache or moderate pain.    . pravastatin (PRAVACHOL) 40 MG tablet Take 40 mg by mouth daily.    Marland Kitchen gabapentin (NEURONTIN) 300 MG capsule Take 300 mg by mouth 2 (two) times daily. (Patient not taking: Reported on 07/13/2020)     No facility-administered medications prior to visit.    Allergies  Allergen Reactions  . Penicillins     Unknown reaction    ROS Review of Systems    Objective:    Physical Exam  BP 130/87 (BP Location: Left Arm, Patient Position: Sitting, Cuff Size: Normal)   Pulse (!) 105   Temp 98.7 F (37.1 C) (Oral)   Resp 18   Ht 6' (1.829 m)   Wt 182 lb 1.9 oz (82.6 kg)   SpO2 97%   BMI 24.70 kg/m  Wt  Readings from Last 3 Encounters:  07/13/20 182 lb 1.9 oz (82.6 kg)  04/12/20 192 lb (87.1 kg)  04/10/20 188 lb (85.3 kg)     Health Maintenance Due  Topic Date Due  . COVID-19 Vaccine (1) Never done  . HIV Screening  Never done  . Hepatitis C Screening  Never done  . TETANUS/TDAP  Never done  . COLONOSCOPY (Pts 45-16yrs Insurance coverage will need to be confirmed)  Never done  . Zoster Vaccines- Shingrix (1 of 2) Never done  . PNA vac Low Risk Adult (1 of 2 - PCV13) Never done    There are no preventive care reminders to display for this patient.  No results found for: TSH Lab Results  Component Value Date   WBC 5.7 12/12/2019   HGB 14.9 12/12/2019   HCT 46.6 12/12/2019   MCV 89.6 12/12/2019   PLT 195 12/12/2019   Lab Results  Component Value Date   NA 139 12/12/2019   K 4.4 12/12/2019   CO2 25 12/12/2019   GLUCOSE 99 12/12/2019   BUN 9 12/12/2019   CREATININE 0.90 12/12/2019   CALCIUM 9.1 12/12/2019   ANIONGAP 7 12/12/2019   No results found for: CHOL No results found for: HDL No results found for: LDLCALC No results found for: TRIG No results found for: CHOLHDL No results found for: IRCV8L    Assessment & Plan:   Problem List Items Addressed This Visit      Cardiovascular and Mediastinum   Sinus node dysfunction (HCC)    S/p pacemaker placement Follows up with Cardiologist      Relevant Medications   apixaban (ELIQUIS) 5 MG TABS tablet   pravastatin (PRAVACHOL) 40 MG tablet   PAF (paroxysmal atrial fibrillation) (HCC)    H/o PAF On Eliquis, but he states that he does not have any medication Refilled Eliquis, emphasized importance of compliance Follows up with Cardiologist      Relevant Medications   apixaban (ELIQUIS) 5 MG TABS tablet   pravastatin (PRAVACHOL) 40 MG tablet     Nervous and Auditory   Alzheimer's dementia without behavioral disturbance (HCC)    Early cognitive decline with concerns of anxiety and angry behavior at  times MMSE: 25/30 Started Donepezil after discussion in the last visit      Relevant Medications   donepezil (ARICEPT) 5 MG tablet     Musculoskeletal and Integument   DDD (degenerative disc disease), lumbar - Primary    Chronic low back pain Has been on  Oxycodone 5 mg QID Referred to pain clinic for pain management  Patient states that he does not have Oxycodone. But PDMP suggests regular fill of Oxycodone - prescribed by other PCP. Advised patient to check with Pharmacy to make sure his medications are not being misused.      Relevant Orders   Ambulatory referral to Pain Clinic     Other   HLD (hyperlipidemia)    On Pravastatin Check lipid profile      Relevant Medications   apixaban (ELIQUIS) 5 MG TABS tablet   pravastatin (PRAVACHOL) 40 MG tablet      Meds ordered this encounter  Medications  . apixaban (ELIQUIS) 5 MG TABS tablet    Sig: Take 1 tablet (5 mg total) by mouth 2 (two) times daily.    Dispense:  60 tablet    Refill:  3  . donepezil (ARICEPT) 5 MG tablet    Sig: Take 1 tablet (5 mg total) by mouth at bedtime.    Dispense:  90 tablet    Refill:  1  . pravastatin (PRAVACHOL) 40 MG tablet    Sig: Take 1 tablet (40 mg total) by mouth daily.    Dispense:  30 tablet    Refill:  5    Follow-up: Return in about 3 months (around 10/13/2020) for Medication review.    Anabel Halon, MD

## 2020-07-13 NOTE — Patient Instructions (Addendum)
Please continue taking Eliquis and Pravastatin as prescribed. Do not stop taking Eliquis without discussing with your Cardiologist.  Continue to follow low salt diet.  Please check with your Pharmacy about Oxycodone.  Please follow up with Dr Gerilyn Pilgrim as scheduled for pain management.  Please bring your medications in the next visit.

## 2020-07-13 NOTE — Assessment & Plan Note (Signed)
H/o PAF On Eliquis, but he states that he does not have any medication Refilled Eliquis, emphasized importance of compliance Follows up with Cardiologist

## 2020-07-13 NOTE — Assessment & Plan Note (Signed)
Early cognitive decline with concerns of anxiety and angry behavior at times MMSE: 25/30 Started Donepezil after discussion in the last visit

## 2020-07-13 NOTE — Assessment & Plan Note (Signed)
S/p pacemaker placement Follows up with Cardiologist

## 2020-07-14 LAB — CBC
Hematocrit: 45.7 % (ref 37.5–51.0)
Hemoglobin: 15.2 g/dL (ref 13.0–17.7)
MCH: 28.5 pg (ref 26.6–33.0)
MCHC: 33.3 g/dL (ref 31.5–35.7)
MCV: 86 fL (ref 79–97)
Platelets: 193 10*3/uL (ref 150–450)
RBC: 5.33 x10E6/uL (ref 4.14–5.80)
RDW: 12.9 % (ref 11.6–15.4)
WBC: 5.1 10*3/uL (ref 3.4–10.8)

## 2020-07-14 LAB — CMP14+EGFR
ALT: 43 IU/L (ref 0–44)
AST: 43 IU/L — ABNORMAL HIGH (ref 0–40)
Albumin/Globulin Ratio: 2.5 — ABNORMAL HIGH (ref 1.2–2.2)
Albumin: 4.9 g/dL — ABNORMAL HIGH (ref 3.8–4.8)
Alkaline Phosphatase: 71 IU/L (ref 44–121)
BUN/Creatinine Ratio: 8 — ABNORMAL LOW (ref 10–24)
BUN: 8 mg/dL (ref 8–27)
Bilirubin Total: 0.8 mg/dL (ref 0.0–1.2)
CO2: 25 mmol/L (ref 20–29)
Calcium: 9.6 mg/dL (ref 8.6–10.2)
Chloride: 103 mmol/L (ref 96–106)
Creatinine, Ser: 0.99 mg/dL (ref 0.76–1.27)
Globulin, Total: 2 g/dL (ref 1.5–4.5)
Glucose: 109 mg/dL — ABNORMAL HIGH (ref 65–99)
Potassium: 4.3 mmol/L (ref 3.5–5.2)
Sodium: 142 mmol/L (ref 134–144)
Total Protein: 6.9 g/dL (ref 6.0–8.5)
eGFR: 85 mL/min/{1.73_m2} (ref >59)

## 2020-07-14 LAB — LIPID PANEL
Chol/HDL Ratio: 3.5 ratio (ref 0.0–5.0)
Cholesterol, Total: 166 mg/dL (ref 100–199)
HDL: 48 mg/dL (ref >39)
LDL Chol Calc (NIH): 97 mg/dL (ref 0–99)
Triglycerides: 117 mg/dL (ref 0–149)
VLDL Cholesterol Cal: 21 mg/dL (ref 5–40)

## 2020-07-14 LAB — HEMOGLOBIN A1C
Est. average glucose Bld gHb Est-mCnc: 120 mg/dL
Hgb A1c MFr Bld: 5.8 % — ABNORMAL HIGH (ref 4.8–5.6)

## 2020-07-14 LAB — TSH+FREE T4
Free T4: 1.15 ng/dL (ref 0.82–1.77)
TSH: 2.16 u[IU]/mL (ref 0.450–4.500)

## 2020-07-14 LAB — PSA: Prostate Specific Ag, Serum: 4.5 ng/mL — ABNORMAL HIGH (ref 0.0–4.0)

## 2020-07-14 LAB — VITAMIN D 25 HYDROXY (VIT D DEFICIENCY, FRACTURES): Vit D, 25-Hydroxy: 17.4 ng/mL — ABNORMAL LOW (ref 30.0–100.0)

## 2020-07-25 NOTE — Progress Notes (Signed)
Remote pacemaker transmission.   

## 2020-07-27 ENCOUNTER — Other Ambulatory Visit: Payer: Self-pay | Admitting: Internal Medicine

## 2020-07-27 ENCOUNTER — Telehealth: Payer: Self-pay | Admitting: *Deleted

## 2020-07-27 DIAGNOSIS — E785 Hyperlipidemia, unspecified: Secondary | ICD-10-CM

## 2020-07-27 MED ORDER — ROSUVASTATIN CALCIUM 20 MG PO TABS: 20.0000 mg | ORAL_TABLET | Freq: Every day | ORAL | 1 refills | Status: DC

## 2020-07-27 NOTE — Telephone Encounter (Signed)
LVM letting pt know that medication had been sent to pharmacy

## 2020-07-27 NOTE — Telephone Encounter (Signed)
Please advise 

## 2020-07-27 NOTE — Telephone Encounter (Signed)
Pt wife called pt was sent in prevastatin however this was changed last visit with dondiego to rosusvastin 20mg  and he would need this sent to pharmacy instead of prevastatin this can go to [facility]

## 2020-09-04 ENCOUNTER — Other Ambulatory Visit: Payer: Self-pay

## 2020-09-04 ENCOUNTER — Ambulatory Visit (INDEPENDENT_AMBULATORY_CARE_PROVIDER_SITE_OTHER): Payer: Medicare HMO | Admitting: *Deleted

## 2020-09-04 DIAGNOSIS — Z0000 Encounter for general adult medical examination without abnormal findings: Secondary | ICD-10-CM

## 2020-09-04 NOTE — Patient Instructions (Signed)
[name] , Thank you for taking time to come for your Medicare Wellness Visit. I appreciate your ongoing commitment to your health goals. Please review the following plan we discussed and let me know if I can assist you in the future.   Screening recommendations/referrals: Colonoscopy: Due now patient declined referral Recommended yearly ophthalmology/optometry visit for glaucoma screening and checkup Recommended yearly dental visit for hygiene and checkup  Vaccinations: Influenza vaccine: Due now patient declined Pneumococcal vaccine: Due now patient declined Tdap vaccine: Due now patient declined Shingles vaccine: Due now patient declined    Advanced directives: Information provided   Next appointment: 1 year   Preventive Care 66 Years and Older, Male Preventive care refers to lifestyle choices and visits with your health care provider that can promote health and wellness. What does preventive care include? A yearly physical exam. This is also called an annual well check. Dental exams once or twice a year. Routine eye exams. Ask your health care provider how often you should have your eyes checked. Personal lifestyle choices, including: Daily care of your teeth and gums. Regular physical activity. Eating a healthy diet. Avoiding tobacco and drug use. Limiting alcohol use. Practicing safe sex. Taking low doses of aspirin every day. Taking vitamin and mineral supplements as recommended by your health care provider. What happens during an annual well check? The services and screenings done by your health care provider during your annual well check will depend on your age, overall health, lifestyle risk factors, and family history of disease. Counseling  Your health care provider may ask you questions about your: Alcohol use. Tobacco use. Drug use. Emotional well-being. Home and relationship well-being. Sexual activity. Eating habits. History of falls. Memory and  ability to understand (cognition). Work and work Astronomer. Screening  You may have the following tests or measurements: Height, weight, and BMI. Blood pressure. Lipid and cholesterol levels. These may be checked every 5 years, or more frequently if you are over 71 years old. Skin check. Lung cancer screening. You may have this screening every year starting at age 66 if you have a 30-pack-year history of smoking and currently smoke or have quit within the past 15 years. Fecal occult blood test (FOBT) of the stool. You may have this test every year starting at age 66. Flexible sigmoidoscopy or colonoscopy. You may have a sigmoidoscopy every 5 years or a colonoscopy every 10 years starting at age 66. Prostate cancer screening. Recommendations will vary depending on your family history and other risks. Hepatitis C blood test. Hepatitis B blood test. Sexually transmitted disease (STD) testing. Diabetes screening. This is done by checking your blood sugar (glucose) after you have not eaten for a while (fasting). You may have this done every 1-3 years. Abdominal aortic aneurysm (AAA) screening. You may need this if you are a current or former smoker. Osteoporosis. You may be screened starting at age 66 if you are at high risk. Talk with your health care provider about your test results, treatment options, and if necessary, the need for more tests. Vaccines  Your health care provider may recommend certain vaccines, such as: Influenza vaccine. This is recommended every year. Tetanus, diphtheria, and acellular pertussis (Tdap, Td) vaccine. You may need a Td booster every 10 years. Zoster vaccine. You may need this after age 66. Pneumococcal 13-valent conjugate (PCV13) vaccine. One dose is recommended after age 66. Pneumococcal polysaccharide (PPSV23) vaccine. One dose is recommended after age 66. Talk to your health care provider about which  screenings and vaccines you need and how often you need  them. This information is not intended to replace advice given to you by your health care provider. Make sure you discuss any questions you have with your health care provider. Document Released: 02/23/2015 Document Revised: 10/17/2015 Document Reviewed: 11/28/2014 Elsevier Interactive Patient Education  2017 [place] Prevention in the Home Falls can cause injuries. They can happen to people of all ages. There are many things you can do to make your home safe and to help prevent falls. What can I do on the outside of my home? Regularly fix the edges of walkways and driveways and fix any cracks. Remove anything that might make you trip as you walk through a door, such as a raised step or threshold. Trim any bushes or trees on the path to your home. Use bright outdoor lighting. Clear any walking paths of anything that might make someone trip, such as rocks or tools. Regularly check to see if handrails are loose or broken. Make sure that both sides of any steps have handrails. Any raised decks and porches should have guardrails on the edges. Have any leaves, snow, or ice cleared regularly. Use sand or salt on walking paths during winter. Clean up any spills in your garage right away. This includes oil or grease spills. What can I do in the bathroom? Use night lights. Install grab bars by the toilet and in the tub and shower. Do not use towel bars as grab bars. Use non-skid mats or decals in the tub or shower. If you need to sit down in the shower, use a plastic, non-slip stool. Keep the floor dry. Clean up any water that spills on the floor as soon as it happens. Remove soap buildup in the tub or shower regularly. Attach bath mats securely with double-sided non-slip rug tape. Do not have throw rugs and other things on the floor that can make you trip. What can I do in the bedroom? Use night lights. Make sure that you have a light by your bed that is easy to reach. Do not use  any sheets or blankets that are too big for your bed. They should not hang down onto the floor. Have a firm chair that has side arms. You can use this for support while you get dressed. Do not have throw rugs and other things on the floor that can make you trip. What can I do in the kitchen? Clean up any spills right away. Avoid walking on wet floors. Keep items that you use a lot in easy-to-reach places. If you need to reach something above you, use a strong step stool that has a grab bar. Keep electrical cords out of the way. Do not use floor polish or wax that makes floors slippery. If you must use wax, use non-skid floor wax. Do not have throw rugs and other things on the floor that can make you trip. What can I do with my stairs? Do not leave any items on the stairs. Make sure that there are handrails on both sides of the stairs and use them. Fix handrails that are broken or loose. Make sure that handrails are as long as the stairways. Check any carpeting to make sure that it is firmly attached to the stairs. Fix any carpet that is loose or worn. Avoid having throw rugs at the top or bottom of the stairs. If you do have throw rugs, attach them to the floor with carpet  tape. Make sure that you have a light switch at the top of the stairs and the bottom of the stairs. If you do not have them, ask someone to add them for you. What else can I do to help prevent falls? Wear shoes that: Do not have high heels. Have rubber bottoms. Are comfortable and fit you well. Are closed at the toe. Do not wear sandals. If you use a stepladder: Make sure that it is fully opened. Do not climb a closed stepladder. Make sure that both sides of the stepladder are locked into place. Ask someone to hold it for you, if possible. Clearly mark and make sure that you can see: Any grab bars or handrails. First and last steps. Where the edge of each step is. Use tools that help you move around (mobility aids)  if they are needed. These include: Canes. Walkers. Scooters. Crutches. Turn on the lights when you go into a dark area. Replace any light bulbs as soon as they burn out. Set up your furniture so you have a clear path. Avoid moving your furniture around. If any of your floors are uneven, fix them. If there are any pets around you, be aware of where they are. Review your medicines with your doctor. Some medicines can make you feel dizzy. This can increase your chance of falling. Ask your doctor what other things that you can do to help prevent falls. This information is not intended to replace advice given to you by your health care provider. Make sure you discuss any questions you have with your health care provider. Document Released: 11/23/2008 Document Revised: 07/05/2015 Document Reviewed: 03/03/2014 Elsevier Interactive Patient Education  2017 [employer].

## 2020-09-04 NOTE — Progress Notes (Signed)
Subjective:   Jonathan Poole is a 65 y.o. male who presents for Medicare Annual/Subsequent preventive examination.  Review of Systems           Objective:    There were no vitals filed for this visit. There is no height or weight on file to calculate BMI.  No flowsheet data found.  Current Medications (verified) Outpatient Encounter Medications as of 09/04/2020  Medication Sig   acetaminophen (TYLENOL) 500 MG tablet Take 1,000 mg by mouth every 6 (six) hours as needed for moderate pain or headache.   apixaban (ELIQUIS) 5 MG TABS tablet Take 1 tablet (5 mg total) by mouth 2 (two) times daily.   donepezil (ARICEPT) 5 MG tablet Take 1 tablet (5 mg total) by mouth at bedtime.   gabapentin (NEURONTIN) 300 MG capsule Take 300 mg by mouth 2 (two) times daily. (Patient not taking: Reported on 07/13/2020)   oxyCODONE (OXY IR/ROXICODONE) 5 MG immediate release tablet Take 5 mg by mouth every 6 (six) hours as needed for severe pain.   rosuvastatin (CRESTOR) 20 MG tablet Take 1 tablet (20 mg total) by mouth daily.   No facility-administered encounter medications on file as of 09/04/2020.    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/2020   Procedure: PPM GENERATOR CHANGEOUT;  Surgeon: Marinus Maw, MD;  Location: [facility];  Service: Cardiovascular;  Laterality: N/A;   Family History  Problem Relation Age of Onset   Prostate cancer Father    Heart disease Father    Social History   Socioeconomic History   Marital status: Married    Spouse name: Not on file   Number of children: 3   Years of education: Not on file   Highest education level: Not on file  Occupational History   Occupation: retired  Tobacco Use   Smoking status: Former    Packs/day: 1.00    Years: 15.00     Pack years: 15.00    Types: Cigarettes    Quit date: 2001    Years since quitting: 21.5   Smokeless tobacco: Never  Vaping Use   Vaping Use: Some days  Substance and Sexual Activity   Alcohol use: Never   Drug use: Not Currently    Types: Marijuana   Sexual activity: Not on file  Other Topics Concern   Not on file  Social History Narrative   Not on file   Social Determinants of Health   Financial Resource Strain: Not on file  Food Insecurity: Not on file  Transportation Needs: Not on file  Physical Activity: Not on file  Stress: Not on file  Social Connections: Not on file    Tobacco Counseling Counseling given: Not Answered   Clinical Intake:                 Diabetic?No         Activities of Daily Living In your present state of health, do you have any difficulty performing the following activities: 04/12/2020  Hearing? N  Vision? N  Difficulty concentrating or making decisions? N  Walking or climbing stairs? N  Dressing or bathing? N  Doing errands, shopping? N  Some recent data might be hidden    Patient Care Team: Anabel Halon, MD as  PCP - General (Internal Medicine) Marinus Maw, MD as PCP - Electrophysiology (Cardiology) Jena Gauss Gerrit Friends, MD as Consulting Physician (Gastroenterology)  Indicate any recent Medical Services you may have received from other than [facility] providers in the past year (date may be approximate).     Assessment:   This is a routine wellness examination for Jonathan Poole.  Hearing/Vision screen No results found.  Dietary issues and exercise activities discussed:     Goals Addressed   None   Depression Screen PHQ 2/9 Scores 07/13/2020 04/12/2020  PHQ - 2 Score 0 2  PHQ- 9 Score - 6    Fall Risk Fall Risk  07/13/2020 04/12/2020  Falls in the past year? 0 0  Number falls in past yr: 0 0  Injury with Fall? 0 0  Risk for fall due to : No Fall Risks No Fall Risks  Follow up Falls evaluation completed Falls evaluation  completed    FALL RISK PREVENTION PERTAINING TO THE HOME:  Any stairs in or around the home? Yes  If so, are there any without handrails? Yes  Home free of loose throw rugs in walkways, pet beds, electrical cords, etc? Yes  Adequate lighting in your home to reduce risk of falls? Yes   ASSISTIVE DEVICES UTILIZED TO PREVENT FALLS:  Life alert? No  Use of a cane, walker or w/c? No  Grab bars in the bathroom? Yes  Shower chair or bench in shower? Yes  Elevated toilet seat or a handicapped toilet? No   TIMED UP AND GO:  Was the test performed? No .  Length of time to ambulate 10 feet: NA sec.     Cognitive Function:        Immunizations  There is no immunization history on file for this patient.  Tetanus/TDAP: Due now   Flu Vaccine status: Declined, Education has been provided regarding the importance of this vaccine but patient still declined. Advised may receive this vaccine at local pharmacy or Health Dept. Aware to provide a copy of the vaccination record if obtained from local pharmacy or Health Dept. Verbalized acceptance and understanding.  Pneumococcal vaccine status: Declined,  Education has been provided regarding the importance of this vaccine but patient still declined. Advised may receive this vaccine at local pharmacy or Health Dept. Aware to provide a copy of the vaccination record if obtained from local pharmacy or Health Dept. Verbalized acceptance and understanding.   Covid-19 vaccine status: Declined, Education has been provided regarding the importance of this vaccine but patient still declined. Advised may receive this vaccine at local pharmacy or Health Dept.or vaccine clinic. Aware to provide a copy of the vaccination record if obtained from local pharmacy or Health Dept. Verbalized acceptance and understanding.  Qualifies for Shingles Vaccine? Yes   Zostavax completed No   Shingrix Completed?: No.    Education has been provided regarding the importance  of this vaccine. Patient has been advised to call insurance company to determine out of pocket expense if they have not yet received this vaccine. Advised may also receive vaccine at local pharmacy or Health Dept. Verbalized acceptance and understanding.  Screening Tests Health Maintenance  Topic Date Due   COVID-19 Vaccine (1) Never done   HIV Screening  Never done   Hepatitis C Screening  Never done   TETANUS/TDAP  Never done   COLONOSCOPY (Pts 45-51yrs Insurance coverage will need to be confirmed)  Never done   Zoster Vaccines- Shingrix (1 of 2) Never done  PNA vac Low Risk Adult (1 of 2 - PCV13) Never done   INFLUENZA VACCINE  09/10/2020   HPV VACCINES  Aged Out    Health Maintenance  Health Maintenance Due  Topic Date Due   COVID-19 Vaccine (1) Never done   HIV Screening  Never done   Hepatitis C Screening  Never done   TETANUS/TDAP  Never done   COLONOSCOPY (Pts 45-18yrs Insurance coverage will need to be confirmed)  Never done   Zoster Vaccines- Shingrix (1 of 2) Never done   PNA vac Low Risk Adult (1 of 2 - PCV13) Never done    Colonoscopy: Patient declines   Lung Cancer Screening: (Low Dose CT Chest recommended if Age 71-80 years, 30 pack-year currently smoking OR have quit w/in 15years.) does not qualify.   Lung Cancer Screening Referral: NA  Additional Screening:  Hepatitis C Screening: does qualify; ordered   Vision Screening: Recommended annual ophthalmology exams for early detection of glaucoma and other disorders of the eye. Is the patient up to date with their annual eye exam?  Yes  Who is the provider or what is the name of the office in which the patient attends annual eye exams? [facility]  If pt is not established with a provider, would they like to be referred to a provider to establish care? No .   Dental Screening: Recommended annual dental exams for proper oral hygiene  Community Resource Referral / Chronic Care Management: CRR  required this visit?  No   CCM required this visit?  No      Plan:     I have personally reviewed and noted the following in the patient's chart:   Medical and social history Use of alcohol, tobacco or illicit drugs  Current medications and supplements including opioid prescriptions. Patient is currently taking opioid prescriptions. Information provided to patient regarding non-opioid alternatives. Patient advised to discuss non-opioid treatment plan with their provider. Functional ability and status Nutritional status Physical activity Advanced directives List of other physicians Hospitalizations, surgeries, and ER visits in previous 12 months Vitals Screenings to include cognitive, depression, and falls Referrals and appointments  In addition, I have reviewed and discussed with patient certain preventive protocols, quality metrics, and best practice recommendations. A written personalized care plan for preventive services as well as general preventive health recommendations were provided to patient.     Park Breed, CMA   09/04/2020   Nurse Notes: Telehealth, Spoke with patient about 40 mins

## 2020-09-12 ENCOUNTER — Ambulatory Visit (HOSPITAL_COMMUNITY): Payer: Medicare HMO | Admitting: Physical Therapy

## 2020-09-24 NOTE — Progress Notes (Addendum)
I connected with  Jonathan Poole on 09-04-20 by an audio enabled telemedicine application and verified that I am speaking with the correct person using two identifiers.   I discussed the limitations, risks, security and privacy concerns of performing an evaluation and management service by telephone and the availability of in person appointments. I also discussed with the patient that there may be a patient responsible charge related to this service. The patient expressed understanding and verbally consented to this telephonic visit.  The patient was at home. The provider was Bjorn Pippin NP who was in office. This was a telehealth visit.

## 2020-10-02 ENCOUNTER — Ambulatory Visit (INDEPENDENT_AMBULATORY_CARE_PROVIDER_SITE_OTHER): Payer: Medicare HMO

## 2020-10-02 DIAGNOSIS — I495 Sick sinus syndrome: Secondary | ICD-10-CM | POA: Diagnosis not present

## 2020-10-02 LAB — CUP PACEART REMOTE DEVICE CHECK
Battery Remaining Longevity: 109 mo
Battery Remaining Percentage: 95 %
Battery Voltage: 3.01 V
Brady Statistic AP VP Percent: 1 %
Brady Statistic AP VS Percent: 96 %
Brady Statistic AS VP Percent: 1 %
Brady Statistic AS VS Percent: 3.2 %
Brady Statistic RA Percent Paced: 95 %
Brady Statistic RV Percent Paced: 1 %
Date Time Interrogation Session: 20220823040014
Implantable Lead Implant Date: 20100504
Implantable Lead Implant Date: 20100524
Implantable Lead Location: 753859
Implantable Lead Location: 753860
Implantable Pulse Generator Implant Date: 20211122
Lead Channel Impedance Value: 390 Ohm
Lead Channel Impedance Value: 480 Ohm
Lead Channel Pacing Threshold Amplitude: 0.75 V
Lead Channel Pacing Threshold Amplitude: 1.25 V
Lead Channel Pacing Threshold Pulse Width: 0.3 ms
Lead Channel Pacing Threshold Pulse Width: 0.7 ms
Lead Channel Sensing Intrinsic Amplitude: 2.8 mV
Lead Channel Sensing Intrinsic Amplitude: 5 mV
Lead Channel Setting Pacing Amplitude: 1.5 V
Lead Channel Setting Pacing Amplitude: 1.5 V
Lead Channel Setting Pacing Pulse Width: 0.7 ms
Lead Channel Setting Sensing Sensitivity: 0.7 mV
Pulse Gen Model: 2272
Pulse Gen Serial Number: 3864825

## 2020-10-12 ENCOUNTER — Ambulatory Visit: Payer: Medicare HMO | Admitting: Internal Medicine

## 2020-10-17 NOTE — Progress Notes (Signed)
Remote pacemaker transmission.   

## 2020-12-20 ENCOUNTER — Other Ambulatory Visit (HOSPITAL_COMMUNITY): Payer: Self-pay | Admitting: Physician Assistant

## 2020-12-20 DIAGNOSIS — M545 Low back pain, unspecified: Secondary | ICD-10-CM

## 2021-01-01 ENCOUNTER — Ambulatory Visit (INDEPENDENT_AMBULATORY_CARE_PROVIDER_SITE_OTHER): Payer: Medicare HMO

## 2021-01-01 DIAGNOSIS — I495 Sick sinus syndrome: Secondary | ICD-10-CM | POA: Diagnosis not present

## 2021-01-01 LAB — CUP PACEART REMOTE DEVICE CHECK
Battery Remaining Longevity: 109 mo
Battery Remaining Percentage: 92 %
Battery Voltage: 3.01 V
Brady Statistic AP VP Percent: 1 %
Brady Statistic AP VS Percent: 96 %
Brady Statistic AS VP Percent: 1 %
Brady Statistic AS VS Percent: 3 %
Brady Statistic RA Percent Paced: 96 %
Brady Statistic RV Percent Paced: 1 %
Date Time Interrogation Session: 20221122040014
Implantable Lead Implant Date: 20100504
Implantable Lead Implant Date: 20100524
Implantable Lead Location: 753859
Implantable Lead Location: 753860
Implantable Pulse Generator Implant Date: 20211122
Lead Channel Impedance Value: 460 Ohm
Lead Channel Impedance Value: 510 Ohm
Lead Channel Pacing Threshold Amplitude: 0.75 V
Lead Channel Pacing Threshold Amplitude: 1.375 V
Lead Channel Pacing Threshold Pulse Width: 0.3 ms
Lead Channel Pacing Threshold Pulse Width: 0.7 ms
Lead Channel Sensing Intrinsic Amplitude: 5 mV
Lead Channel Sensing Intrinsic Amplitude: 5.4 mV
Lead Channel Setting Pacing Amplitude: 1.5 V
Lead Channel Setting Pacing Amplitude: 1.625
Lead Channel Setting Pacing Pulse Width: 0.7 ms
Lead Channel Setting Sensing Sensitivity: 0.7 mV
Pulse Gen Model: 2272
Pulse Gen Serial Number: 3864825

## 2021-01-10 NOTE — Progress Notes (Signed)
Remote pacemaker transmission.   

## 2021-01-13 ENCOUNTER — Emergency Department (HOSPITAL_COMMUNITY)
Admission: EM | Admit: 2021-01-13 | Discharge: 2021-01-13 | Discharge status: Against Medical Advice | Payer: Medicare HMO | Source: Home / Self Care

## 2021-01-14 ENCOUNTER — Other Ambulatory Visit: Payer: Self-pay

## 2021-01-14 ENCOUNTER — Emergency Department (HOSPITAL_COMMUNITY): Payer: Medicare HMO

## 2021-01-14 ENCOUNTER — Emergency Department (HOSPITAL_COMMUNITY)
Admission: EM | Admit: 2021-01-14 | Discharge: 2021-01-15 | Disposition: A | Discharge status: Home / Self Care | Payer: Medicare HMO | Attending: Emergency Medicine | Admitting: Emergency Medicine

## 2021-01-14 ENCOUNTER — Ambulatory Visit
Admission: EM | Admit: 2021-01-14 | Discharge: 2021-01-14 | Disposition: A | Discharge status: Home / Self Care | Payer: Medicare HMO

## 2021-01-14 ENCOUNTER — Encounter (HOSPITAL_COMMUNITY): Payer: Self-pay | Admitting: *Deleted

## 2021-01-14 DIAGNOSIS — F0390 Unspecified dementia without behavioral disturbance: Secondary | ICD-10-CM | POA: Insufficient documentation

## 2021-01-14 DIAGNOSIS — S6992XA Unspecified injury of left wrist, hand and finger(s), initial encounter: Secondary | ICD-10-CM | POA: Diagnosis present

## 2021-01-14 DIAGNOSIS — Z950 Presence of cardiac pacemaker: Secondary | ICD-10-CM | POA: Insufficient documentation

## 2021-01-14 DIAGNOSIS — Z7901 Long term (current) use of anticoagulants: Secondary | ICD-10-CM | POA: Diagnosis not present

## 2021-01-14 DIAGNOSIS — W208XXA Other cause of strike by thrown, projected or falling object, initial encounter: Secondary | ICD-10-CM | POA: Diagnosis not present

## 2021-01-14 DIAGNOSIS — S61012A Laceration without foreign body of left thumb without damage to nail, initial encounter: Secondary | ICD-10-CM

## 2021-01-14 DIAGNOSIS — Z87891 Personal history of nicotine dependence: Secondary | ICD-10-CM | POA: Insufficient documentation

## 2021-01-14 HISTORY — DX: Unspecified dementia, unspecified severity, without behavioral disturbance, psychotic disturbance, mood disturbance, and anxiety: F03.90

## 2021-01-14 NOTE — ED Triage Notes (Addendum)
Pt with injury to left thumb that occurred over 24 hours ago per wife.  Wife states a metal rack that holds firewood fell over onto left thumb.  Lac noted on side of nail.  Wife and son dropped pt off and went back home. Pt not able to state what happened to his thumb due to memory issues.  Unknown of last tetanus shot as well

## 2021-01-15 ENCOUNTER — Ambulatory Visit: Payer: Medicare HMO | Admitting: Internal Medicine

## 2021-01-15 NOTE — Discharge Instructions (Signed)
Keep the thumb wound clean with warm soaks using soap then rinse well and apply Band-Aid.  The healing process will likely take about 2 weeks.  Return here or see his doctor as needed for problems

## 2021-01-15 NOTE — ED Provider Notes (Signed)
[facility] EMERGENCY DEPARTMENT Provider Note   CSN: [id number] Arrival date & time: 01/14/21  1916     History Chief Complaint  Patient presents with   Laceration    Jonathan Poole is a 65 y.o. male.  HPI He injured his left, when a metal rack fell onto it, about 24 hours prior to arrival.  He is unable to give history because of chronic memory disorder.  His complaint to me is that he needs pain medicine for his back.  He takes chronic narcotics, oxycodone 10 mg 4 times a day.  I contact the patient's wife by phone, after she left.  She gives history as stated above.  She states patient has worsening memory trouble, and that he takes his chronic pain medicine regularly.  She does not think he has had any pain medicines this evening, since leaving the house to come to the ED.  There are no other known active modifying factors    Past Medical History:  Diagnosis Date   Dementia (HCC)    Heart disease    Hyperchloremia     Patient Active Problem List   Diagnosis Date Noted   H/O cardiac pacemaker 04/12/2020   PAF (paroxysmal atrial fibrillation) (HCC) 04/12/2020   Alzheimer's dementia without behavioral disturbance (HCC) 04/12/2020   DDD (degenerative disc disease), lumbar 04/12/2020   HLD (hyperlipidemia) 04/12/2020   Sinus node dysfunction (HCC) 01/02/2020   Elevated PSA 06/20/2019    Past Surgical History:  Procedure Laterality Date   PACEMAKER IMPLANT     PPM GENERATOR CHANGEOUT N/A 01/02/2020   Procedure: PPM GENERATOR CHANGEOUT;  Surgeon: Marinus Maw, MD;  Location: [facility] INVASIVE CV LAB;  Service: Cardiovascular;  Laterality: N/A;       Family History  Problem Relation Age of Onset   Prostate cancer Father    Heart disease Father     Social History   Tobacco Use   Smoking status: Former    Packs/day: 1.00    Years: 15.00    Pack years: 15.00    Types: Cigarettes    Quit date: 2001    Years since quitting: 21.9   Smokeless tobacco: Never   Vaping Use   Vaping Use: Some days  Substance Use Topics   Alcohol use: Never   Drug use: Not Currently    Types: Marijuana    Home Medications Prior to Admission medications   Medication Sig Start Date End Date Taking? Authorizing Provider  acetaminophen (TYLENOL) 500 MG tablet Take 1,000 mg by mouth every 6 (six) hours as needed for moderate pain or headache.    [provider]  apixaban (ELIQUIS) 5 MG TABS tablet Take 1 tablet (5 mg total) by mouth 2 (two) times daily. 07/13/20   Anabel Halon, MD  donepezil (ARICEPT) 5 MG tablet Take 1 tablet (5 mg total) by mouth at bedtime. 07/13/20   Anabel Halon, MD  gabapentin (NEURONTIN) 300 MG capsule Take 300 mg by mouth 2 (two) times daily.    [provider]  oxyCODONE (OXY IR/ROXICODONE) 5 MG immediate release tablet Take 5 mg by mouth every 6 (six) hours as needed for severe pain.    [provider]  rosuvastatin (CRESTOR) 20 MG tablet Take 1 tablet (20 mg total) by mouth daily. 07/27/20   Anabel Halon, MD    Allergies    Penicillins  Review of Systems   Review of Systems  All other systems reviewed and are negative.  Physical  Exam Updated Vital Signs BP 113/76 (BP Location: Right Arm)   Pulse 70   Temp 98.1 F (36.7 C) (Oral)   Resp 18   SpO2 96%   Physical Exam Vitals and nursing note reviewed.  Constitutional:      Appearance: He is well-developed. He is not ill-appearing.  HENT:     Head: Normocephalic and atraumatic.     Right Ear: External ear normal.     Left Ear: External ear normal.  Eyes:     Conjunctiva/sclera: Conjunctivae normal.     Pupils: Pupils are equal, round, and reactive to light.  Neck:     Trachea: Phonation normal.  Cardiovascular:     Rate and Rhythm: Normal rate.  Pulmonary:     Effort: Pulmonary effort is normal.  Abdominal:     General: There is no distension.  Musculoskeletal:        General: Normal range of motion.     Cervical back: Normal range of  motion and neck supple.     Comments: Left thumb with laceration, to the distal finger, lateral to the nail.  No damage to the nail.  Laceration is somewhat macerated, and the skin is white discolored.  There is no associated foreign body, or ischemia.  There is normal motion of the left thumb.  Skin:    General: Skin is warm and dry.  Neurological:     Mental Status: He is alert.     Cranial Nerves: No cranial nerve deficit.     Sensory: No sensory deficit.     Motor: No abnormal muscle tone.     Coordination: Coordination normal.  Psychiatric:        Mood and Affect: Mood normal.        Behavior: Behavior normal.    ED Results / Procedures / Treatments   Labs (all labs ordered are listed, but only abnormal results are displayed) Labs Reviewed - No data to display  EKG None  Radiology DG Finger Thumb Left  Result Date: 01/14/2021 CLINICAL DATA:  Left thumb laceration one day ago with pain, initial encounter EXAM: LEFT THUMB 2+V COMPARISON:  None. FINDINGS: Soft tissue injury is noted distally consistent with the given clinical history. No radiopaque foreign body is noted. No acute fracture is seen. IMPRESSION: Soft tissue injury without acute bony abnormality. Electronically Signed   By: Alcide Clever M.D.   On: 01/14/2021 21:07    Procedures Procedures   Medications Ordered in ED Medications - No data to display  ED Course  I have reviewed the triage vital signs and the nursing notes.  Pertinent labs & imaging results that were available during my care of the patient were reviewed by me and considered in my medical decision making (see chart for details).    MDM Rules/Calculators/A&P                            Patient Vitals for the past 24 hrs:  BP Temp Temp src Pulse Resp SpO2  01/15/21 0008 113/76 98.1 F (36.7 C) Oral 70 18 96 %  01/14/21 2019 138/74 98.1 F (36.7 C) Oral 71 18 98 %    At the time of discharge-Reevaluation with update and discussion. After  initial assessment and treatment, an updated evaluation reveals he is calm and comfortable.Mancel Bale   Medical Decision Making:  This patient is presenting for evaluation of thumb injury, which does require a range of  treatment options, and is a complaint that involves a moderate risk of morbidity and mortality. The differential diagnoses include contusion, fracture, soft tissue injury. I decided to review old records, and in summary elderly male with cognitive dysfunction presenting with thumb injury, 24 hours old.  I obtained additional historical information from wife by telephone.   Radiologic Tests Ordered, included x-ray left thumb.  I independently Visualized: Radiographic images, which show no fracture or foreign body   Critical Interventions-clinical evaluation wound care with soaking, cleansing and Xeroform dressing applied by nursing.  After These Interventions, the Patient was reevaluated and was found stable for discharge with wound care at home.Patient's wife instructed on wound care by me.  CRITICAL CARE-no Performed by: Mancel Bale  Nursing Notes Reviewed/ Care Coordinated Applicable Imaging Reviewed Interpretation of Laboratory Data incorporated into ED treatment  The patient appears reasonably screened and/or stabilized for discharge and I doubt any other medical condition or other [facility] requiring further screening, evaluation, or treatment in the ED at this time prior to discharge.  Plan: Home Medications-continue; Home Treatments-wound care at home; return here if the recommended treatment, does not improve the symptoms; Recommended follow up-PCP, as needed     Final Clinical Impression(s) / ED Diagnoses Final diagnoses:  Laceration of left thumb without foreign body, nail damage status unspecified, initial encounter    Rx / DC Orders ED Discharge Orders     None        Mancel Bale, MD 01/15/21 [phone]

## 2021-01-15 NOTE — ED Notes (Signed)
Soaking thumb in saline and peroxide

## 2021-03-16 ENCOUNTER — Other Ambulatory Visit: Payer: Self-pay | Admitting: Internal Medicine

## 2021-03-16 DIAGNOSIS — I480 Paroxysmal atrial fibrillation: Secondary | ICD-10-CM

## 2021-03-21 ENCOUNTER — Ambulatory Visit (INDEPENDENT_AMBULATORY_CARE_PROVIDER_SITE_OTHER): Payer: Medicare HMO | Admitting: Internal Medicine

## 2021-03-21 ENCOUNTER — Other Ambulatory Visit: Payer: Self-pay

## 2021-03-21 ENCOUNTER — Encounter: Payer: Self-pay | Admitting: Internal Medicine

## 2021-03-21 VITALS — BP 122/86 | HR 77 | Resp 18 | Ht 72.0 in | Wt 178.0 lb

## 2021-03-21 DIAGNOSIS — Z1211 Encounter for screening for malignant neoplasm of colon: Secondary | ICD-10-CM

## 2021-03-21 DIAGNOSIS — Z79891 Long term (current) use of opiate analgesic: Secondary | ICD-10-CM | POA: Diagnosis not present

## 2021-03-21 DIAGNOSIS — I480 Paroxysmal atrial fibrillation: Secondary | ICD-10-CM

## 2021-03-21 DIAGNOSIS — Z2821 Immunization not carried out because of patient refusal: Secondary | ICD-10-CM

## 2021-03-21 DIAGNOSIS — H9113 Presbycusis, bilateral: Secondary | ICD-10-CM

## 2021-03-21 DIAGNOSIS — G309 Alzheimer's disease, unspecified: Secondary | ICD-10-CM | POA: Diagnosis not present

## 2021-03-21 DIAGNOSIS — R972 Elevated prostate specific antigen [PSA]: Secondary | ICD-10-CM

## 2021-03-21 DIAGNOSIS — F02818 Dementia in other diseases classified elsewhere, unspecified severity, with other behavioral disturbance: Secondary | ICD-10-CM

## 2021-03-21 MED ORDER — DONEPEZIL HCL 10 MG PO TABS: 10.0000 mg | ORAL_TABLET | Freq: Every day | ORAL | 1 refills | Status: DC

## 2021-03-21 NOTE — Assessment & Plan Note (Signed)
Followed by pain clinic in [facility]

## 2021-03-21 NOTE — Progress Notes (Addendum)
Established Patient Office Visit  Subjective:  Patient ID: Jonathan Poole, male    DOB: 11-20-1955  Age: 66 y.o. MRN: [id number]  CC:  Chief Complaint  Patient presents with   Memory Loss    Pt has been having memory issues for a year feels as if this is getting worse. Also had blood work but would like to discuss last results     HPI Jonathan Poole is a 66 y.o. male with past medical history of sinus node dysfunction s/p pacemaker placement, PAF, HLD, DDD of lumbar spine and memory problem who presents for f/u of his chronic medical conditions.  His partner is concerned about his memory.  His MMSE was 22/30 today, worse compared to prior.  He is taking donepezil currently.  He denies any recent injury or fall.  He does not recall date or the year.  His partner states that he does not go out alone, but he himself reports going to grocery store alone and times.  He still drives, but is partner thinks that he gets lost in the streets at times.  She also reports that he has episodes of agitation.  Denies any episode of delusion or hallucination. His hearing is impaired, and he does not wear hearing aid.  He sees [place] pain clinic for chronic pain.  He is currently on oxycodone for chronic pain from DDD of lumbar spine.    Past Medical History:  Diagnosis Date   Dementia ([place])    Heart disease    Hyperchloremia     Past Surgical History:  Procedure Laterality Date   PACEMAKER IMPLANT     PPM GENERATOR CHANGEOUT N/A 01/02/2020   Procedure: PPM GENERATOR CHANGEOUT;  Surgeon: Evans Lance, MD;  Location: [place] CV LAB;  Service: Cardiovascular;  Laterality: N/A;    Family History  Problem Relation Age of Onset   Prostate cancer Father    Heart disease Father     Social History   Socioeconomic History   Marital status: Married    Spouse name: Not on file   Number of children: 3   Years of education: Not on file   Highest education level: Not on file   Occupational History   Occupation: retired  Tobacco Use   Smoking status: Former    Packs/day: 1.00    Years: 15.00    Pack years: 15.00    Types: Cigarettes    Quit date: 2001    Years since quitting: 22.1   Smokeless tobacco: Never  Vaping Use   Vaping Use: Some days  Substance and Sexual Activity   Alcohol use: Never   Drug use: Not Currently    Types: Marijuana   Sexual activity: Not on file  Other Topics Concern   Not on file  Social History Narrative   Not on file   Social Determinants of Health   Financial Resource Strain: Low Risk    Difficulty of Paying Living Expenses: Not hard at all  Food Insecurity: No Food Insecurity   Worried About Charity fundraiser in the Last Year: Never true   [place] in the Last Year: Never true  Transportation Needs: No Transportation Needs   Lack of Transportation (Medical): No   Lack of Transportation (Non-Medical): No  Physical Activity: Sufficiently Active   Days of Exercise per Week: 3 days   Minutes of Exercise per Session: 50 min  Stress: No Stress Concern Present   Feeling of  Stress : Not at all  Social Connections: Moderately Isolated   Frequency of Communication with Friends and Family: More than three times a week   Frequency of Social Gatherings with Friends and Family: More than three times a week   Attends Religious Services: Never   Marine scientist or Organizations: No   Attends Music therapist: Never   Marital Status: Married  Human resources officer Violence: Not At Risk   Fear of Current or Ex-Partner: No   Emotionally Abused: No   Physically Abused: No   Sexually Abused: No    Outpatient Medications Prior to Visit  Medication Sig Dispense Refill   acetaminophen (TYLENOL) 500 MG tablet Take 1,000 mg by mouth every 6 (six) hours as needed for moderate pain or headache.     ELIQUIS 5 MG TABS tablet TAKE (1) TABLET BY MOUTH TWICE DAILY. 60 tablet 11   gabapentin (NEURONTIN) 300 MG  capsule Take 300 mg by mouth 2 (two) times daily.     rosuvastatin (CRESTOR) 20 MG tablet Take 1 tablet (20 mg total) by mouth daily. 90 tablet 1   donepezil (ARICEPT) 5 MG tablet Take 1 tablet (5 mg total) by mouth at bedtime. 90 tablet 1   oxyCODONE (OXY IR/ROXICODONE) 5 MG immediate release tablet Take 5 mg by mouth every 6 (six) hours as needed for severe pain. (Patient not taking: Reported on 03/21/2021)     No facility-administered medications prior to visit.    Allergies  Allergen Reactions   Penicillins     Unknown reaction    ROS Review of Systems  Constitutional:  Negative for chills and fever.  HENT:  Positive for hearing loss. Negative for congestion and sore throat.   Eyes:  Negative for pain and discharge.  Respiratory:  Negative for cough and shortness of breath.   Cardiovascular:  Negative for chest pain and palpitations.  Gastrointestinal:  Negative for constipation, diarrhea, nausea and vomiting.  Endocrine: Negative for polydipsia and polyuria.  Genitourinary:  Negative for dysuria and hematuria.  Musculoskeletal:  Positive for back pain. Negative for neck pain and neck stiffness.  Skin:  Negative for rash.  Neurological:  Negative for dizziness, weakness, numbness and headaches.  Psychiatric/Behavioral:  Positive for agitation, confusion and sleep disturbance. Negative for behavioral problems. The patient is nervous/anxious.      Objective:    Physical Exam Vitals reviewed.  Constitutional:      General: He is not in acute distress.    Appearance: He is not diaphoretic.  HENT:     Head: Normocephalic and atraumatic.     Nose: Nose normal.     Mouth/Throat:     Mouth: Mucous membranes are moist.  Eyes:     General: No scleral icterus.    Extraocular Movements: Extraocular movements intact.  Cardiovascular:     Rate and Rhythm: Normal rate and regular rhythm.     Pulses: Normal pulses.     Heart sounds: Normal heart sounds. No murmur heard. Pulmonary:      Breath sounds: Normal breath sounds. No wheezing or rales.  Musculoskeletal:     Cervical back: Neck supple. No tenderness.     Right lower leg: No edema.     Left lower leg: No edema.  Skin:    General: Skin is warm.     Findings: No rash.  Neurological:     General: No focal deficit present.     Mental Status: He is alert and oriented to person,  place, and time.     Cranial Nerves: No cranial nerve deficit.     Sensory: No sensory deficit.     Motor: No weakness.  Psychiatric:        Mood and Affect: Mood normal.        Behavior: Behavior normal.    BP 122/86 (BP Location: Right Arm, Patient Position: Sitting, Cuff Size: Normal)    Pulse 77    Resp 18    Ht 6' (1.829 m)    Wt 178 lb (80.7 kg)    SpO2 97%    BMI 24.14 kg/m  Wt Readings from Last 3 Encounters:  03/21/21 178 lb (80.7 kg)  07/13/20 182 lb 1.9 oz (82.6 kg)  04/12/20 192 lb (87.1 kg)    Lab Results  Component Value Date   TSH 2.160 07/13/2020   Lab Results  Component Value Date   WBC 5.1 07/13/2020   HGB 15.2 07/13/2020   HCT 45.7 07/13/2020   MCV 86 07/13/2020   PLT 193 07/13/2020   Lab Results  Component Value Date   NA 144 03/21/2021   K 3.9 03/21/2021   CO2 WILL FOLLOW 03/21/2021   GLUCOSE WILL FOLLOW 03/21/2021   BUN WILL FOLLOW 03/21/2021   CREATININE WILL FOLLOW 03/21/2021   BILITOT 0.8 07/13/2020   ALKPHOS 71 07/13/2020   AST 43 (H) 07/13/2020   ALT 43 07/13/2020   PROT 6.9 07/13/2020   ALBUMIN 4.9 (H) 07/13/2020   CALCIUM WILL FOLLOW 03/21/2021   ANIONGAP 7 12/12/2019   EGFR WILL FOLLOW 03/21/2021   Lab Results  Component Value Date   CHOL 166 07/13/2020   Lab Results  Component Value Date   HDL 48 07/13/2020   Lab Results  Component Value Date   LDLCALC 97 07/13/2020   Lab Results  Component Value Date   TRIG 117 07/13/2020   Lab Results  Component Value Date   CHOLHDL 3.5 07/13/2020   Lab Results  Component Value Date   HGBA1C 5.8 (H) 07/13/2020       Assessment & Plan:   Problem List Items Addressed This Visit       Cardiovascular and Mediastinum   PAF (paroxysmal atrial fibrillation) ([place])    H/o PAF On Eliquis Refilled Eliquis, emphasized importance of compliance Follows up with Cardiologist        Nervous and Auditory   Alzheimer's dementia with behavioral disturbance ([place]) - Primary    Early cognitive decline with concerns of anxiety and angry behavior at times MMSE: 22/30 Had started Donepezil after discussion in the last visit, increased dose to 10 mg daily Will check MRI of the brain to rule out vascular etiology for his early onset dementia He needs hearing aide to help with hearing, as untreated hearing loss can worsen dementia      Relevant Medications   donepezil (ARICEPT) 10 MG tablet   Other Relevant Orders   Basic Metabolic Panel (BMET) (Completed)   MR Brain Wo Contrast   AMB Referral to Community Care Coordinaton   Presbycusis of both ears    He needs hearing aide to help with hearing, as untreated hearing loss can worsen dementia        Other   Elevated PSA    Will recheck PSA Currently asymptomatic If elevated, will refer to urology      Relevant Orders   PSA (Completed)   Chronic prescription opiate use    Followed by pain clinic in [place]  Other Visit Diagnoses     Special screening for malignant neoplasms, colon       Relevant Orders   Cologuard   Influenza vaccination declined           Meds ordered this encounter  Medications   donepezil (ARICEPT) 10 MG tablet    Sig: Take 1 tablet (10 mg total) by mouth at bedtime.    Dispense:  90 tablet    Refill:  1    Follow-up: Return in about 4 months (around 07/19/2021) for Dementia.    Lindell Spar, MD

## 2021-03-21 NOTE — Assessment & Plan Note (Signed)
Will recheck PSA Currently asymptomatic If elevated, will refer to urology

## 2021-03-21 NOTE — Assessment & Plan Note (Addendum)
Early cognitive decline with concerns of anxiety and angry behavior at times MMSE: 22/30 Had started Donepezil after discussion in the last visit, increased dose to 10 mg daily Will check MRI of the brain to rule out vascular etiology for his early onset dementia He needs hearing aide to help with hearing, as untreated hearing loss can worsen dementia

## 2021-03-21 NOTE — Assessment & Plan Note (Signed)
H/o PAF On Eliquis Refilled Eliquis, emphasized importance of compliance Follows up with Cardiologist

## 2021-03-21 NOTE — Patient Instructions (Signed)
Please start taking Donepezil 10 mg instead of 5 mg.  Please get MRI of brain done as scheduled.

## 2021-03-22 ENCOUNTER — Telehealth: Payer: Self-pay | Admitting: *Deleted

## 2021-03-22 DIAGNOSIS — H9113 Presbycusis, bilateral: Secondary | ICD-10-CM | POA: Insufficient documentation

## 2021-03-22 NOTE — Chronic Care Management (AMB) (Signed)
°  Chronic Care Management   Outreach Note  03/22/2021 Name: Jonathan Poole MRN: [id number] DOB: 03/12/1955  Jonathan Poole is a 66 y.o. year old male who is a primary care patient of Anabel Halon, MD. I reached out to Dracen A Wohlers by phone today in response to a referral sent by Jonathan Poole's primary care provider.  An unsuccessful telephone outreach was attempted today. The patient was referred to the case management team for assistance with care management and care coordination.   Follow Up Plan: A HIPAA compliant phone message was left for the patient providing contact information and requesting a return call.  The care management team will reach out to the patient again over the next 7 days.  If patient returns call to provider office, please advise to call Embedded Care Management Care Guide [name]* at [phone].[name]  Care Guide, Embedded Care Coordination [facility] Management  Direct Dial: [phone]

## 2021-03-22 NOTE — Assessment & Plan Note (Signed)
He needs hearing aide to help with hearing, as untreated hearing loss can worsen dementia

## 2021-03-23 LAB — BASIC METABOLIC PANEL
BUN/Creatinine Ratio: 7 — ABNORMAL LOW (ref 10–24)
BUN: 7 mg/dL — ABNORMAL LOW (ref 8–27)
CO2: 21 mmol/L (ref 20–29)
Calcium: 9.1 mg/dL (ref 8.6–10.2)
Chloride: 104 mmol/L (ref 96–106)
Creatinine, Ser: 1 mg/dL (ref 0.76–1.27)
Glucose: 88 mg/dL (ref 70–99)
Potassium: 3.9 mmol/L (ref 3.5–5.2)
Sodium: 144 mmol/L (ref 134–144)
eGFR: 83 mL/min/{1.73_m2} (ref >59)

## 2021-03-23 LAB — PSA: Prostate Specific Ag, Serum: 5.9 ng/mL — ABNORMAL HIGH (ref 0.0–4.0)

## 2021-03-25 ENCOUNTER — Other Ambulatory Visit: Payer: Self-pay | Admitting: Internal Medicine

## 2021-03-25 DIAGNOSIS — R972 Elevated prostate specific antigen [PSA]: Secondary | ICD-10-CM

## 2021-03-27 ENCOUNTER — Encounter: Payer: Self-pay | Admitting: *Deleted

## 2021-03-29 ENCOUNTER — Telehealth: Payer: Self-pay | Admitting: Internal Medicine

## 2021-03-29 NOTE — Chronic Care Management (AMB) (Signed)
Chronic Care Management   Note  03/29/2021 Name: Jonathan Poole MRN: [id number] DOB: 1956/01/09  Jonathan Poole is a 66 y.o. year old male who is a primary care patient of Lindell Spar, MD. I reached out to [place] by phone today in response to a referral sent by Jonathan Poole PCP.  Jonathan Poole was given information about Chronic Care Management services today including:  CCM service includes personalized support from designated clinical staff supervised by his physician, including individualized plan of care and coordination with other care providers 24/7 contact phone numbers for assistance for urgent and routine care needs. Service will only be billed when office clinical staff spend 20 minutes or more in a month to coordinate care. Only one practitioner may furnish and bill the service in a calendar month. The patient may stop CCM services at any time (effective at the end of the month) by phone call to the office staff. The patient is responsible for co-pay (up to 20% after annual deductible is met) if co-pay is required by the individual health plan.   Patient agreed to services and verbal consent obtained.   Follow up plan: Telephone appointment with care management team member scheduled for:05/03/21  [facility]  Direct Dial: [phone]

## 2021-03-29 NOTE — Telephone Encounter (Signed)
Pt advised with verbal understanding  °

## 2021-03-29 NOTE — Telephone Encounter (Signed)
Return call for blood work

## 2021-04-01 ENCOUNTER — Telehealth: Payer: Self-pay

## 2021-04-01 NOTE — Telephone Encounter (Signed)
° °  Telephone encounter was:  Unsuccessful.  04/01/2021 Name: Jonathan Poole MRN: [id number] DOB: 06-02-1955  Unsuccessful outbound call made today to assist with:   support groups  Outreach Attempt:  1st Attempt  A HIPAA compliant voice message was left requesting a return call.  Instructed patient to call back at  earliest convenience. [facility], Care Management  [phone] [address], [place], [place] [address] Phone: [phone] Email: [email or web address]

## 2021-04-02 ENCOUNTER — Telehealth: Payer: Self-pay

## 2021-04-02 ENCOUNTER — Ambulatory Visit (INDEPENDENT_AMBULATORY_CARE_PROVIDER_SITE_OTHER): Payer: Medicare HMO

## 2021-04-02 ENCOUNTER — Encounter: Payer: Self-pay | Admitting: Internal Medicine

## 2021-04-02 DIAGNOSIS — I495 Sick sinus syndrome: Secondary | ICD-10-CM | POA: Diagnosis not present

## 2021-04-02 LAB — CUP PACEART REMOTE DEVICE CHECK
Battery Remaining Longevity: 103 mo
Battery Remaining Percentage: 90 %
Battery Voltage: 3.01 V
Brady Statistic AP VP Percent: 1 %
Brady Statistic AP VS Percent: 96 %
Brady Statistic AS VP Percent: 1 %
Brady Statistic AS VS Percent: 3.2 %
Brady Statistic RA Percent Paced: 95 %
Brady Statistic RV Percent Paced: 1 %
Date Time Interrogation Session: 20230221040015
Implantable Lead Implant Date: 20100504
Implantable Lead Implant Date: 20100524
Implantable Lead Location: 753859
Implantable Lead Location: 753860
Implantable Pulse Generator Implant Date: 20211122
Lead Channel Impedance Value: 410 Ohm
Lead Channel Impedance Value: 480 Ohm
Lead Channel Pacing Threshold Amplitude: 0.75 V
Lead Channel Pacing Threshold Amplitude: 1.5 V
Lead Channel Pacing Threshold Pulse Width: 0.3 ms
Lead Channel Pacing Threshold Pulse Width: 0.7 ms
Lead Channel Sensing Intrinsic Amplitude: 3 mV
Lead Channel Sensing Intrinsic Amplitude: 5 mV
Lead Channel Setting Pacing Amplitude: 1.5 V
Lead Channel Setting Pacing Amplitude: 1.75 V
Lead Channel Setting Pacing Pulse Width: 0.7 ms
Lead Channel Setting Sensing Sensitivity: 0.7 mV
Pulse Gen Model: 2272
Pulse Gen Serial Number: 3864825

## 2021-04-02 NOTE — Telephone Encounter (Signed)
° °  Telephone encounter was:  Successful.  04/02/2021 Name: OZ GAMMEL MRN: [id number] DOB: 12-02-1955  Pearletha Furl Dyal is a 66 y.o. year old male who is a primary care patient of Anabel Halon, MD . The community resource team was consulted for assistance with Caregiver Stress  Care guide performed the following interventions: Patient provided with information about care guide support team and interviewed to confirm resource needs.Patients wife stated they needed resources for support and education for dementia  Follow Up Plan:  Care guide will follow up with patient by phone over the next week    [facility] Guide, Embedded Care Coordination [facility], Care Management  [phone] [address] [place], [place], [place] [address] Phone: [phone] Email: [email or web address]

## 2021-04-03 ENCOUNTER — Ambulatory Visit (HOSPITAL_COMMUNITY): Payer: Medicare HMO

## 2021-04-03 ENCOUNTER — Other Ambulatory Visit: Payer: Self-pay | Admitting: Internal Medicine

## 2021-04-03 DIAGNOSIS — G309 Alzheimer's disease, unspecified: Secondary | ICD-10-CM

## 2021-04-08 NOTE — Progress Notes (Signed)
Remote pacemaker transmission.   

## 2021-04-11 ENCOUNTER — Encounter: Payer: Self-pay | Admitting: Urology

## 2021-04-11 ENCOUNTER — Ambulatory Visit (INDEPENDENT_AMBULATORY_CARE_PROVIDER_SITE_OTHER): Payer: Medicare HMO | Admitting: Urology

## 2021-04-11 ENCOUNTER — Other Ambulatory Visit: Payer: Self-pay

## 2021-04-11 VITALS — BP 123/71 | HR 72 | Ht 72.0 in

## 2021-04-11 DIAGNOSIS — R972 Elevated prostate specific antigen [PSA]: Secondary | ICD-10-CM | POA: Diagnosis not present

## 2021-04-11 LAB — URINALYSIS, ROUTINE W REFLEX MICROSCOPIC
Bilirubin, UA: NEGATIVE
Glucose, UA: NEGATIVE
Ketones, UA: NEGATIVE
Leukocytes,UA: NEGATIVE
Nitrite, UA: NEGATIVE
Protein,UA: NEGATIVE
RBC, UA: NEGATIVE
Specific Gravity, UA: 1.025 (ref 1.005–1.030)
Urobilinogen, Ur: 0.2 mg/dL (ref 0.2–1.0)
pH, UA: 6.5 (ref 5.0–7.5)

## 2021-04-11 NOTE — Progress Notes (Signed)
I was called into room by MD to assist with patient- patient was sitting in the chair diaphoretic and only responsive to painful stimuli.  ?BP checked- 57/34 HR 70. Patient responded to sternum rub ?Son at bedside- with MD.  ?911 called for emergency transport to ER.  ?Vitals reassessed 88/54 HR 72 ?Patient starting to respond to commands and answers questions-still clammy ? ?EMS arrives- BP 92/60 HR 70 ?EMS assumes care of patient in office.   ?

## 2021-04-11 NOTE — Progress Notes (Signed)
? ?Assessment: ?1. Elevated PSA   ? ? ?Plan: ?Today I had a long discussion with the patient regarding PSA and the rationale and controversies of prostate cancer early detection.  I discussed the pros and cons of further evaluation including TRUS and prostate Bx.  Potential adverse events and complications as well as standard instructions were given.  Patient expressed his understanding of these issues. ?Patient had a episode in the office with decreased level of consciousness and diaphoresis.  His systolic blood pressure was 57.  EMS was called to evaluate the patient. ?We will need to discuss his anticoagulation status as well as his ability to undergo a prostate biopsy with cardiology and his PCP. ? ?Chief Complaint:  ?Chief Complaint  ?Patient presents with  ? Elevated PSA  ? ? ?History of Present Illness: ? ?Jonathan Poole is a 66 y.o. year old male who is seen in consultation from Anabel Halon, MD for evaluation of elevated PSA. ?PSA results:  ?6/22 4.5 ?2/23 5.9 ? ?No prior prostate biopsy.  No history of UTIs or prostatitis.  Possible family history of prostate cancer in his grandfather. ?He reports nocturia 0-1 time per night.  No dysuria or gross hematuria. ?IPSS = 4 today. ? ?He takes Eliquis for atrial fibrillation. ? ? ?Past Medical History:  ?Past Medical History:  ?Diagnosis Date  ? Atrial fibrillation (HCC)   ? Dementia (HCC)   ? Heart disease   ? Hyperchloremia   ? ? ?Past Surgical History:  ?Past Surgical History:  ?Procedure Laterality Date  ? PACEMAKER IMPLANT    ? PPM GENERATOR CHANGEOUT N/A 01/02/2020  ? Procedure: PPM GENERATOR CHANGEOUT;  Surgeon: Marinus Maw, MD;  Location: [facility] INVASIVE CV LAB;  Service: Cardiovascular;  Laterality: N/A;  ? ? ?Allergies:  ?Allergies  ?Allergen Reactions  ? Penicillins   ?  Unknown reaction  ? ? ?Family History:  ?Family History  ?Problem Relation Age of Onset  ? Heart disease Father   ? Prostate cancer Paternal Grandfather   ? ? ?Social History:   ?Social History  ? ?Tobacco Use  ? Smoking status: Former  ?  Packs/day: 1.00  ?  Years: 15.00  ?  Pack years: 15.00  ?  Types: Cigarettes  ?  Quit date: 2001  ?  Years since quitting: 22.1  ? Smokeless tobacco: Never  ?Vaping Use  ? Vaping Use: Some days  ?Substance Use Topics  ? Alcohol use: Never  ? Drug use: Not Currently  ?  Types: Marijuana  ? ? ?Review of symptoms:  ?Constitutional:  Negative for unexplained weight loss, night sweats, fever, chills ?ENT:  Negative for nose bleeds, sinus pain, painful swallowing ?CV:  Negative for chest pain, shortness of breath, exercise intolerance, palpitations, loss of consciousness ?Resp:  Negative for cough, wheezing, shortness of breath ?GI:  Negative for nausea, vomiting, diarrhea, bloody stools ?GU:  Positives noted in HPI; otherwise negative for gross hematuria, dysuria, urinary incontinence ?Neuro:  Negative for seizures, poor balance, limb weakness, slurred speech ?Psych:  Negative for lack of energy, depression, anxiety ?Endocrine:  Negative for polydipsia, polyuria, symptoms of hypoglycemia (dizziness, hunger, sweating) ?Hematologic:  Negative for anemia, purpura, petechia, prolonged or excessive bleeding, use of anticoagulants  ?Allergic:  Negative for difficulty breathing or choking as a result of exposure to anything; no shellfish allergy; no allergic response (rash/itch) to materials, foods ? ?Physical exam: ?BP 123/71   Pulse 72   Ht 6' (1.829 m)   BMI 24.14 kg/m?  ?  GENERAL APPEARANCE:  Well appearing, well developed, well nourished, NAD ?HEENT: Atraumatic, Normocephalic, oropharynx clear. ?NECK: Supple without lymphadenopathy or thyromegaly. ?LUNGS: Clear to auscultation bilaterally. ?HEART: Regular Rate and Rhythm without murmurs, gallops, or rubs. ?ABDOMEN: Soft, non-tender, No Masses. ?EXTREMITIES: Moves all extremities well.  Without clubbing, cyanosis, or edema. ?NEUROLOGIC:  Alert and oriented x 3, normal gait, CN II-XII grossly intact.  ?MENTAL  STATUS:  Appropriate. ?BACK:  Non-tender to palpation.  No CVAT ?SKIN:  Warm, dry and intact.   ?GU: ?Penis:  circumcised ?Meatus: Normal ?Scrotum: normal, no masses ?Testis: normal without masses bilateral ?Epididymis: normal ?Prostate: 50 g, NT, no nodules ?Rectum: Normal tone,  no masses or tenderness ? ? ?Results: ?U/A dipstick negative ? ?

## 2021-04-18 ENCOUNTER — Telehealth: Payer: Self-pay

## 2021-04-18 NOTE — Telephone Encounter (Signed)
? ?  Telephone encounter was:  Successful.  ?04/18/2021 ?Name: Jonathan Poole MRN: [id number] DOB: 30-Oct-1955 ? ?Gared A Courter is a 66 y.o. year old male who is a primary care patient of Anabel Halon, MD . The community resource team was consulted for assistance with  support groups ? ?Care guide performed the following interventions: Patient provided with information about care guide support team and interviewed to confirm resource needs.Follow up for mailed resources patient did receive the resources ? ? ?Follow Up Plan:  No further follow up planned at this time. The patient has been provided with needed resources. ? ? ? ?Lenard Forth ?Care Guide, Embedded Care Coordination ?[place], Care Management  ?[phone] ?[address] [place], [place], [place] [address] ?Phone: [phone] ?Email: [email or web address] ? ?  ?

## 2021-04-23 ENCOUNTER — Other Ambulatory Visit: Payer: Self-pay | Admitting: Internal Medicine

## 2021-04-23 DIAGNOSIS — E785 Hyperlipidemia, unspecified: Secondary | ICD-10-CM

## 2021-04-29 ENCOUNTER — Telehealth: Payer: Self-pay

## 2021-04-29 NOTE — Telephone Encounter (Signed)
Spoke to patients wife, she states that she will have to call back after she speaks to Mr. Weiler.  She is not sure if he will want to come back in because being at a doctors office makes him nervous which is why he had the episode at his last apt.  She is asking if alternate options can be sent via letter.  I told the patient I will see if this is an option an notify her when she calls back.  ?

## 2021-04-29 NOTE — Telephone Encounter (Signed)
-----   Message from Milderd Meager, MD sent at 04/25/2021  8:56 AM EDT ----- ?Please contact patient to see if he would like to schedule a follow-up appt to further discuss his elevated PSA and options for management.  His last visit was interrupted by an episode of hypotension requiring EMS evaluation. ? ?

## 2021-05-01 ENCOUNTER — Ambulatory Visit (HOSPITAL_COMMUNITY)
Admission: RE | Admit: 2021-05-01 | Discharge: 2021-05-01 | Disposition: A | Discharge status: Home / Self Care | Payer: Medicare HMO | Source: Ambulatory Visit | Attending: Physician Assistant | Admitting: Physician Assistant

## 2021-05-01 ENCOUNTER — Other Ambulatory Visit: Payer: Self-pay

## 2021-05-01 DIAGNOSIS — M545 Low back pain, unspecified: Secondary | ICD-10-CM | POA: Insufficient documentation

## 2021-05-03 ENCOUNTER — Ambulatory Visit (INDEPENDENT_AMBULATORY_CARE_PROVIDER_SITE_OTHER): Payer: Medicare HMO | Admitting: Licensed Clinical Social Worker

## 2021-05-03 DIAGNOSIS — M5136 Other intervertebral disc degeneration, lumbar region: Secondary | ICD-10-CM

## 2021-05-03 DIAGNOSIS — F02818 Dementia in other diseases classified elsewhere, unspecified severity, with other behavioral disturbance: Secondary | ICD-10-CM

## 2021-05-03 DIAGNOSIS — E782 Mixed hyperlipidemia: Secondary | ICD-10-CM

## 2021-05-03 DIAGNOSIS — I480 Paroxysmal atrial fibrillation: Secondary | ICD-10-CM

## 2021-05-03 DIAGNOSIS — Z950 Presence of cardiac pacemaker: Secondary | ICD-10-CM

## 2021-05-03 NOTE — Chronic Care Management (AMB) (Signed)
?Chronic Care Management  ? ? Clinical Social Work Note ? ?05/03/2021 ?Name: Jonathan Poole MRN: [id number] DOB: April 30, 1955 ? ?Jonathan Poole is a 66 y.o. year old male who is a primary care patient of Jonathan Spar, MD. The CCM team was consulted to assist the patient with chronic disease management and/or care coordination needs related to: [employer] .  ? ?Engaged with patient by telephone for initial visit in response to provider referral for social work chronic care management and care coordination services.  ? ?Consent to Services:  ?The patient was given the following information about Chronic Care Management services today, agreed to services, and gave verbal consent: 1. CCM service includes personalized support from designated clinical staff supervised by the primary care provider, including individualized plan of care and coordination with other care providers 2. 24/7 contact phone numbers for assistance for urgent and routine care needs. 3. Service will only be billed when office clinical staff spend 20 minutes or more in a month to coordinate care. 4. Only one practitioner may furnish and bill the service in a calendar month. 5.The patient may stop CCM services at any time (effective at the end of the month) by phone call to the office staff. 6. The patient will be responsible for cost sharing (co-pay) of up to 20% of the service fee (after annual deductible is met). Patient agreed to services and consent obtained. ? ?Patient agreed to services and consent obtained.  ? ?Assessment: Review of patient past medical history, allergies, medications, and health status, including review of relevant consultants reports was performed today as part of a comprehensive evaluation and provision of chronic care management and care coordination services.    ? ?SDOH (Social Determinants of Health) assessments and interventions performed:  ?SDOH Interventions   ? ?Flowsheet Row Most Recent Value  ?SDOH  Interventions   ?Stress Interventions Provide Counseling  [client has some stress related to managing medical needs. has stress related to back issues. He spoke of burning feeling in his back and spoke of back pain]  ?Depression Interventions/Treatment  Counseling  ? ?  ?  ? ?Advanced Directives Status: See [employer] application for related entries. ? ?CCM Care Plan ? ?Allergies  ?Allergen Reactions  ? Penicillins   ?  Unknown reaction  ? ? ?Outpatient Encounter Medications as of 05/03/2021  ?Medication Sig  ? acetaminophen (TYLENOL) 500 MG tablet Take 1,000 mg by mouth every 6 (six) hours as needed for moderate pain or headache.  ? donepezil (ARICEPT) 10 MG tablet Take 1 tablet (10 mg total) by mouth at bedtime.  ? ELIQUIS 5 MG TABS tablet TAKE (1) TABLET BY MOUTH TWICE DAILY.  ? gabapentin (NEURONTIN) 300 MG capsule Take 300 mg by mouth 2 (two) times daily.  ? gabapentin (NEURONTIN) 400 MG capsule Take 400 mg by mouth 3 (three) times daily.  ? oxyCODONE (OXY IR/ROXICODONE) 5 MG immediate release tablet Take 5 mg by mouth every 6 (six) hours as needed for severe pain. (Patient not taking: Reported on 03/21/2021)  ? Oxycodone HCl 10 MG TABS Take 10 mg by mouth 4 (four) times daily as needed.  ? rosuvastatin (CRESTOR) 20 MG tablet TAKE (1) TABLET BY MOUTH ONCE DAILY.  ? tiZANidine (ZANAFLEX) 4 MG tablet Take 4 mg by mouth 3 (three) times daily.  ? ?No facility-administered encounter medications on file as of 05/03/2021.  ? ? ?Patient Active Problem List  ? Diagnosis Date Noted  ? 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/22/2021  ?  Chronic prescription opiate use 03/21/2021  ? H/O cardiac pacemaker 04/12/2020  ? PAF (paroxysmal atrial fibrillation) ([place]) 04/12/2020  ? Alzheimer's dementia with behavioral disturbance ([place]) 04/12/2020  ? DDD (degenerative disc disease), lumbar 04/12/2020  ? HLD (hyperlipidemia) 04/12/2020  ? Sinus node dysfunction ([place]) 01/02/2020  ? Elevated PSA 06/20/2019  ? ? ?Conditions to be addressed/monitored:  monitor coping skills of client. Monitor client management of memory issues ? ?Care Plan : LCSW Care Plan  ?Updates made by Jonathan Cabal, LCSW since 05/03/2021 12:00 AM  ?  ? ?Problem: Coping Skills (General Plan of Care)   ?  ? ?Goal: Coping Skills Enhanced. Manage pain issues in back; manage memory issues   ?Start Date: 05/03/2021  ?Expected End Date: 07/30/2021  ?This Visit's Progress: On track  ?Priority: Medium  ?Note:   ?Current barriers:   ?Memory issues ?Depression issues ?Back pain issues ? ?Clinical Goals:  ?Client will call LCSW in next 30 days to discuss coping skills of client ?Client will attend scheduled medical appointmetns in next 30 days ?Client will call RNCM as needed in next 30 days for nursing support ?Client to call LCSW in next 30 days to discuss memory issues of client and managing memory issues ? ?Clinical Interventions:  ?Collaboration with Jonathan Spar, MD regarding development and update of comprehensive plan of care as evidenced by provider attestation and co-signature ?Assessment of needs, barriers of client. ?Assessments completed:  PHQ 2/9 and GAD-7 ?Discussed client needs with Jonathan Poole today ?Discussed back pain issues with client  He goes to [facility] for support ?Discussed ambulation of client ?Reviwed appetite of client and sleeping issues of client  ?Reviewed mood of client..  Client feels that his mood is stable. He is taking medications as prescribed ?Reviewed family support. He has support from his wife, Jonathan Poole ;and, he has support from his son, Jonathan Poole ?Reviewed relaxation techniques ( likes to watch sport events, cares for his pet dogs,) ?Reviewed medication procurement for client ?Discussed past work history with client. 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  Client had some difficulty recalling that he has support from [facility] pain clinic.  He said sometimes he may forget something for a while and  recall it later. ?Encouraged client to call RNCM as needed for nursing support ? Discussed CCM program support with [place] ?Encouraged [place] to call LCSW as needed in next 30 days for SW support ? ?Patient Coping Skills ?Has support from his spouse, Jonathan Poole , and from his son, Jonathan Poole ?No transport needs ?Attends scheduled medical appointments ? ?Patient Deficits: ? ?Memory issues ?Pain issues (he spoke of back pain issues experienced) ? ?Patient Goals: In next 30 days, patient will: ?Attend scheduled medical appointments ?Communicate with spouse, Jonathan Poole, about his needs faced ?Allow time for rest and relaxation ?Take medications as prescribed ?Communicate, as needed with CCM staff for support  ? ?Follow Up Plan: LCSW to call client on 06/14/21 at 3:00 PM to assess client needs  ?  ? ?Norva Riffle.Berlin Mokry MSW, LCSW ?Licensed Clinical Social Worker ?[facility] ?[phone] ? ?

## 2021-05-03 NOTE — Patient Instructions (Addendum)
Visit Information ? ?Patient Goals:  coping skills enhanced. Manage memory issues. Manage pain issues ? ?Time Frame:  Short Term Goal ?Priority:  Medium ?Progress:  On Track ? ?Start Date:  05/03/21 ?End Date:  07/30/21 ? ?Follow Up Date:  06/14/21 at 3:00 PM ? ?Coping skills enhanced. Manage memory issues. Manage pain issues ? ?Patient Coping Skills ?Has support from his spouse, Darl Pikes , and from his son, Onalee Hua ?No transport needs ?Attends scheduled medical appointments ? ?Patient Deficits: ? ?Memory issues ?Pain issues (he spoke of back pain issues experienced) ? ?Patient Goals: In next 30 days, patient will: ?Attend scheduled medical appointments ?Communicate with spouse, Darl Pikes, about his needs faced ?Allow time for rest and relaxation ?Take medications as prescribed ?Communicate, as needed with CCM staff for support  ? ?Follow Up Plan: LCSW to call client on 06/14/21 at 3:00 PM to assess client needs  ? ?Kelton Pillar.Alyha Marines MSW, LCSW ?Licensed Clinical Social Worker ?[facility] ?[phone] ?

## 2021-05-09 ENCOUNTER — Encounter: Payer: Self-pay | Admitting: Urology

## 2021-05-10 DIAGNOSIS — E782 Mixed hyperlipidemia: Secondary | ICD-10-CM

## 2021-05-10 DIAGNOSIS — F02818 Dementia in other diseases classified elsewhere, unspecified severity, with other behavioral disturbance: Secondary | ICD-10-CM

## 2021-05-10 DIAGNOSIS — I480 Paroxysmal atrial fibrillation: Secondary | ICD-10-CM

## 2021-05-10 DIAGNOSIS — G309 Alzheimer's disease, unspecified: Secondary | ICD-10-CM

## 2021-05-14 ENCOUNTER — Encounter: Payer: Self-pay | Admitting: Orthopedic Surgery

## 2021-05-15 ENCOUNTER — Ambulatory Visit (HOSPITAL_COMMUNITY): Admission: RE | Admit: 2021-05-15 | Payer: Medicare HMO | Source: Ambulatory Visit

## 2021-06-14 ENCOUNTER — Telehealth: Payer: Medicare HMO

## 2021-07-02 ENCOUNTER — Encounter: Payer: Self-pay | Admitting: Internal Medicine

## 2021-07-02 ENCOUNTER — Ambulatory Visit (INDEPENDENT_AMBULATORY_CARE_PROVIDER_SITE_OTHER): Payer: Medicare HMO

## 2021-07-02 ENCOUNTER — Telehealth: Payer: Self-pay | Admitting: Internal Medicine

## 2021-07-02 DIAGNOSIS — I495 Sick sinus syndrome: Secondary | ICD-10-CM

## 2021-07-02 LAB — CUP PACEART REMOTE DEVICE CHECK
Battery Remaining Longevity: 101 mo
Battery Remaining Percentage: 88 %
Battery Voltage: 3.01 V
Brady Statistic AP VP Percent: 1 %
Brady Statistic AP VS Percent: 96 %
Brady Statistic AS VP Percent: 1 %
Brady Statistic AS VS Percent: 3.2 %
Brady Statistic RA Percent Paced: 96 %
Brady Statistic RV Percent Paced: 1 %
Date Time Interrogation Session: 20230523040020
Implantable Lead Implant Date: 20100504
Implantable Lead Implant Date: 20100524
Implantable Lead Location: 753859
Implantable Lead Location: 753860
Implantable Pulse Generator Implant Date: 20211122
Lead Channel Impedance Value: 430 Ohm
Lead Channel Impedance Value: 490 Ohm
Lead Channel Pacing Threshold Amplitude: 0.75 V
Lead Channel Pacing Threshold Amplitude: 1.375 V
Lead Channel Pacing Threshold Pulse Width: 0.3 ms
Lead Channel Pacing Threshold Pulse Width: 0.7 ms
Lead Channel Sensing Intrinsic Amplitude: 5 mV
Lead Channel Sensing Intrinsic Amplitude: 5.4 mV
Lead Channel Setting Pacing Amplitude: 1.5 V
Lead Channel Setting Pacing Amplitude: 1.625
Lead Channel Setting Pacing Pulse Width: 0.7 ms
Lead Channel Setting Sensing Sensitivity: 0.7 mV
Pulse Gen Model: 2272
Pulse Gen Serial Number: 3864825

## 2021-07-10 ENCOUNTER — Telehealth: Payer: Medicare HMO

## 2021-07-16 NOTE — Progress Notes (Signed)
Remote pacemaker transmission.   

## 2021-07-29 ENCOUNTER — Ambulatory Visit: Payer: Medicare HMO | Admitting: Internal Medicine

## 2021-07-31 ENCOUNTER — Telehealth: Payer: Self-pay | Admitting: Internal Medicine

## 2021-07-31 ENCOUNTER — Encounter: Payer: Self-pay | Admitting: Family Medicine

## 2021-07-31 ENCOUNTER — Ambulatory Visit (INDEPENDENT_AMBULATORY_CARE_PROVIDER_SITE_OTHER): Payer: Medicare HMO | Admitting: Family Medicine

## 2021-07-31 VITALS — BP 118/78 | HR 95 | Ht 72.0 in | Wt 175.4 lb

## 2021-07-31 DIAGNOSIS — S41151A Open bite of right upper arm, initial encounter: Secondary | ICD-10-CM | POA: Diagnosis not present

## 2021-07-31 DIAGNOSIS — Z23 Encounter for immunization: Secondary | ICD-10-CM | POA: Diagnosis not present

## 2021-07-31 DIAGNOSIS — E785 Hyperlipidemia, unspecified: Secondary | ICD-10-CM

## 2021-07-31 DIAGNOSIS — F02818 Dementia in other diseases classified elsewhere, unspecified severity, with other behavioral disturbance: Secondary | ICD-10-CM

## 2021-07-31 DIAGNOSIS — G309 Alzheimer's disease, unspecified: Secondary | ICD-10-CM

## 2021-07-31 DIAGNOSIS — R110 Nausea: Secondary | ICD-10-CM

## 2021-07-31 DIAGNOSIS — W540XXA Bitten by dog, initial encounter: Secondary | ICD-10-CM

## 2021-07-31 DIAGNOSIS — M62838 Other muscle spasm: Secondary | ICD-10-CM

## 2021-07-31 MED ORDER — ONDANSETRON HCL 4 MG PO TABS: 4.0000 mg | ORAL_TABLET | Freq: Three times a day (TID) | ORAL | 0 refills | Status: DC | PRN

## 2021-07-31 MED ORDER — ROSUVASTATIN CALCIUM 20 MG PO TABS: ORAL_TABLET | ORAL | 0 refills | Status: DC

## 2021-07-31 MED ORDER — TIZANIDINE HCL 4 MG PO TABS: 4.0000 mg | ORAL_TABLET | Freq: Three times a day (TID) | ORAL | 1 refills | Status: DC

## 2021-07-31 MED ORDER — DONEPEZIL HCL 10 MG PO TABS: 10.0000 mg | ORAL_TABLET | Freq: Every day | ORAL | 1 refills | Status: DC

## 2021-07-31 MED ORDER — DOXYCYCLINE HYCLATE 100 MG PO TABS: ORAL_TABLET | ORAL | 0 refills | Status: DC

## 2021-07-31 NOTE — Telephone Encounter (Signed)
Came by office in regard to social work calls. States that patient has not received called for past 2 scheduled tele appoints. Wants to see about scheduling in person appts instead.   Wants a call back and can leave detailed messages.

## 2021-07-31 NOTE — Progress Notes (Signed)
Established Patient Office Visit  Subjective:  Patient ID: Jonathan Poole, male    DOB: 11-Mar-1955  Age: 66 y.o. MRN: [id number]  CC:  Chief Complaint  Patient presents with   Follow-up    Pt is here to follow up for 4 months, c/o an arm bite by his dogs over the weekend has swelling on his right forearm. Pt would like nausea medication prescribed has sx on and off at times.     HPI  Jonathan Poole is a 66 y.o. male with past medical history of Alzheimer's dementia with behavioral disturbance presents for f/u of chronic medical conditions.  Alzheimer's dementia: wife reports increased agitation and forgetfulness. She reports that his condition has worsened since last seen and that the medication is not working. The patient reports feeling well, noting that he maintains his ADLs and drives independently. Dog bite: reports being bitten by one of his dogs on 07/27/21. His dogs are fully vaccinated with their rabies shot. He stepped in to separate the two dogs that were fighting over food. The right hand is swollen, with purple discolaration.no pain, pus, or drainage noted.   TDAP given today. Past Medical History:  Diagnosis Date   Atrial fibrillation ([place])    Dementia ([place])    Heart disease    Hyperchloremia     Past Surgical History:  Procedure Laterality Date   PACEMAKER IMPLANT     PPM GENERATOR CHANGEOUT N/A 01/02/2020   Procedure: PPM GENERATOR CHANGEOUT;  Surgeon: Evans Lance, MD;  Location: [facility] CV LAB;  Service: Cardiovascular;  Laterality: N/A;    Family History  Problem Relation Age of Onset   Heart disease Father    Prostate cancer Paternal Grandfather     Social History   Socioeconomic History   Marital status: Married    Spouse name: Not on file   Number of children: 3   Years of education: Not on file   Highest education level: Not on file  Occupational History   Occupation: retired  Tobacco Use   Smoking status: Former    Packs/day:  1.00    Years: 15.00    Total pack years: 15.00    Types: Cigarettes    Quit date: 2001    Years since quitting: 22.4   Smokeless tobacco: Never  Vaping Use   Vaping Use: Some days  Substance and Sexual Activity   Alcohol use: Never   Drug use: Not Currently    Types: Marijuana   Sexual activity: Not on file  Other Topics Concern   Not on file  Social History Narrative   Not on file   Social Determinants of Health   Financial Resource Strain: Low Risk  (09/04/2020)   Overall Financial Resource Strain (CARDIA)    Difficulty of Paying Living Expenses: Not hard at all  Food Insecurity: No Food Insecurity (09/04/2020)   Hunger Vital Sign    Worried About Running Out of Food in the Last Year: Never true    [place] in the Last Year: Never true  Transportation Needs: No Transportation Needs (09/04/2020)   PRAPARE - Hydrologist (Medical): No    Lack of Transportation (Non-Medical): No  Physical Activity: Sufficiently Active (09/04/2020)   Exercise Vital Sign    Days of Exercise per Week: 3 days    Minutes of Exercise per Session: 50 min  Stress: Stress Concern Present (05/03/2021)   [place] -  Occupational Stress Questionnaire    Feeling of Stress : To some extent  Social Connections: Moderately Isolated (09/04/2020)   Social Connection and Isolation Panel [NHANES]    Frequency of Communication with Friends and Family: More than three times a week    Frequency of Social Gatherings with Friends and Family: More than three times a week    Attends Religious Services: Never    Marine scientist or Organizations: No    Attends Archivist Meetings: Never    Marital Status: Married  Human resources officer Violence: Not At Risk (09/04/2020)   Humiliation, Afraid, Rape, and Kick questionnaire    Fear of Current or Ex-Partner: No    Emotionally Abused: No    Physically Abused: No    Sexually Abused: No     Outpatient Medications Prior to Visit  Medication Sig Dispense Refill   acetaminophen (TYLENOL) 500 MG tablet Take 1,000 mg by mouth every 6 (six) hours as needed for moderate pain or headache.     ELIQUIS 5 MG TABS tablet TAKE (1) TABLET BY MOUTH TWICE DAILY. 60 tablet 11   gabapentin (NEURONTIN) 300 MG capsule Take 300 mg by mouth 2 (two) times daily.     gabapentin (NEURONTIN) 400 MG capsule Take 400 mg by mouth 3 (three) times daily.     oxyCODONE (OXY IR/ROXICODONE) 5 MG immediate release tablet Take 5 mg by mouth every 6 (six) hours as needed for severe pain.     Oxycodone HCl 10 MG TABS Take 10 mg by mouth 4 (four) times daily as needed.     donepezil (ARICEPT) 10 MG tablet Take 1 tablet (10 mg total) by mouth at bedtime. 90 tablet 1   rosuvastatin (CRESTOR) 20 MG tablet TAKE (1) TABLET BY MOUTH ONCE DAILY. 90 tablet 0   tiZANidine (ZANAFLEX) 4 MG tablet Take 4 mg by mouth 3 (three) times daily.     No facility-administered medications prior to visit.    Allergies  Allergen Reactions   Penicillins     Unknown reaction    ROS Review of Systems  Constitutional:  Negative for fatigue and fever.  HENT:  Negative for congestion and dental problem.   Eyes:  Negative for photophobia and visual disturbance.  Respiratory:  Negative for chest tightness.   Gastrointestinal:  Negative for nausea and vomiting.  Psychiatric/Behavioral:  Negative for self-injury and suicidal ideas.       Objective:    Physical Exam HENT:     Head: Normocephalic.     Right Ear: External ear normal.     Left Ear: External ear normal.  Cardiovascular:     Rate and Rhythm: Normal rate.     Pulses: Normal pulses.     Heart sounds: Normal heart sounds.  Skin:    Findings: Bruising and erythema (right arm is swollen, with purple discoloration. no pain or signs of infection noted) present.  Neurological:     Mental Status: He is alert and oriented to person, place, and time.     Comments: MMSE-21      BP 118/78   Pulse 95   Ht 6' (1.829 m)   Wt 175 lb 6.4 oz (79.6 kg)   SpO2 97%   BMI 23.79 kg/m  Wt Readings from Last 3 Encounters:  07/31/21 175 lb 6.4 oz (79.6 kg)  03/21/21 178 lb (80.7 kg)  07/13/20 182 lb 1.9 oz (82.6 kg)    Lab Results  Component Value Date   TSH 2.160  07/13/2020   Lab Results  Component Value Date   WBC 5.1 07/13/2020   HGB 15.2 07/13/2020   HCT 45.7 07/13/2020   MCV 86 07/13/2020   PLT 193 07/13/2020   Lab Results  Component Value Date   NA 144 03/21/2021   K 3.9 03/21/2021   CO2 21 03/21/2021   GLUCOSE 88 03/21/2021   BUN 7 (L) 03/21/2021   CREATININE 1.00 03/21/2021   BILITOT 0.8 07/13/2020   ALKPHOS 71 07/13/2020   AST 43 (H) 07/13/2020   ALT 43 07/13/2020   PROT 6.9 07/13/2020   ALBUMIN 4.9 (H) 07/13/2020   CALCIUM 9.1 03/21/2021   ANIONGAP 7 12/12/2019   EGFR 83 03/21/2021   Lab Results  Component Value Date   CHOL 166 07/13/2020   Lab Results  Component Value Date   HDL 48 07/13/2020   Lab Results  Component Value Date   LDLCALC 97 07/13/2020   Lab Results  Component Value Date   TRIG 117 07/13/2020   Lab Results  Component Value Date   CHOLHDL 3.5 07/13/2020   Lab Results  Component Value Date   HGBA1C 5.8 (H) 07/13/2020      Assessment & Plan:   Problem List Items Addressed This Visit       Nervous and Auditory   Alzheimer's dementia with behavioral disturbance ([place])    -MMSE-21, indicating mild dementia -Inform the wife about referral to neurology, and she declines at this time -Inform pt and his wife that the patient is on the maximum dose of therapy       Relevant Medications   tiZANidine (ZANAFLEX) 4 MG tablet   donepezil (ARICEPT) 10 MG tablet     Other   HLD (hyperlipidemia)   Relevant Medications   rosuvastatin (CRESTOR) 20 MG tablet   Dog bite of arm, right, initial encounter - Primary    -Tdap given today -Will treat with doxycyline -Informed patient to look for signs of  infection ( increased warmth, swelling, redness, pus or foul discharge/ drainage, fever and chills)      Relevant Medications   doxycycline (VIBRA-TABS) 100 MG tablet   Other Visit Diagnoses     Need for Tdap vaccination       Relevant Orders   Tdap vaccine greater than or equal to 7yo IM (Completed)   Nausea       Relevant Medications   ondansetron (ZOFRAN) 4 MG tablet   Muscle spasm       Relevant Medications   tiZANidine (ZANAFLEX) 4 MG tablet       Meds ordered this encounter  Medications   ondansetron (ZOFRAN) 4 MG tablet    Sig: Take 1 tablet (4 mg total) by mouth every 8 (eight) hours as needed for nausea or vomiting.    Dispense:  20 tablet    Refill:  0   tiZANidine (ZANAFLEX) 4 MG tablet    Sig: Take 1 tablet (4 mg total) by mouth 3 (three) times daily.    Dispense:  30 tablet    Refill:  1   donepezil (ARICEPT) 10 MG tablet    Sig: Take 1 tablet (10 mg total) by mouth at bedtime.    Dispense:  90 tablet    Refill:  1   rosuvastatin (CRESTOR) 20 MG tablet    Sig: TAKE (1) TABLET BY MOUTH ONCE DAILY.    Dispense:  90 tablet    Refill:  0   doxycycline (VIBRA-TABS) 100 MG tablet  Sig: Take (2) tablet PO for 1 day, then (1) tablet PO every 12 hrs for 5 days    Dispense:  12 tablet    Refill:  0    Follow-up: No follow-ups on file.    Alvira Monday, FNP

## 2021-07-31 NOTE — Patient Instructions (Signed)
I appreciate the opportunity to provide care to you today!   please pick up your medications at the pharmacy   Please continue to a heart-healthy diet and increase your physical activities. Try to exercise for at least three times a week.      It was a pleasure to see you and I look forward to continuing to work together on your health and well-being. Please do not hesitate to call the office if you need care or have questions about your care.   Have a wonderful day and week. With Gratitude, Gilmore Laroche MSN, FNP-BC

## 2021-08-01 DIAGNOSIS — W540XXA Bitten by dog, initial encounter: Secondary | ICD-10-CM | POA: Insufficient documentation

## 2021-08-01 NOTE — Assessment & Plan Note (Signed)
-  Tdap given today -Will treat with doxycyline -Informed patient to look for signs of infection ( increased warmth, swelling, redness, pus or foul discharge/ drainage, fever and chills)

## 2021-08-01 NOTE — Assessment & Plan Note (Signed)
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

## 2021-08-07 ENCOUNTER — Encounter: Payer: Medicare HMO | Admitting: Internal Medicine

## 2021-08-07 ENCOUNTER — Encounter: Payer: Self-pay | Admitting: Internal Medicine

## 2021-08-07 ENCOUNTER — Telehealth: Payer: Self-pay | Admitting: *Deleted

## 2021-08-07 NOTE — Chronic Care Management (AMB) (Signed)
  Chronic Care Management Note  08/07/2021 Name: Jonathan Poole MRN: [id number] DOB: December 05, 1955  Jonathan Poole is a 66 y.o. year old male who is a primary care patient of Anabel Halon, MD and is actively engaged with the care management team. I reached out to Jonathan Poole by phone today to assist with scheduling a follow up visit with the Licensed Clinical Social Worker  Follow up plan: Unsuccessful telephone outreach attempt made. A HIPAA compliant phone message was left for the patient providing contact information and requesting a return call.  The care management team will reach out to the patient again over the next 1-2 days.  If patient returns call to provider office, please advise to call Embedded Care Management Care Guide Jonathan Poole at [phone].  Jonathan Poole  Care Guide, Embedded Care Coordination [facility] Management  Direct Dial: [phone]

## 2021-08-08 NOTE — Chronic Care Management (AMB) (Signed)
  Chronic Care Management Note  08/08/2021 Name: Jonathan Poole MRN: [id number] DOB: [birth year]  Jonathan Poole is a 66 y.o. year old male who is a primary care patient of Anabel Halon, MD and is actively engaged with the care management team. I reached out to Kristina A Ceasar by phone today to assist with scheduling a follow up visit with the Licensed Clinical Social Worker  Follow up plan: Unsuccessful telephone outreach attempt made. A HIPAA compliant phone message was left for the patient providing contact information and requesting a return call.  The care management team will reach out to the patient again over the next 1-2 days.  If patient returns call to provider office, please advise to call Embedded Care Management Care Guide [name] at [phone].  [name]  Care Guide, Embedded Care Coordination [facility] Management  Direct Dial: [phone]

## 2021-08-09 NOTE — Chronic Care Management (AMB) (Signed)
  Chronic Care Management Note  08/09/2021 Name: Jonathan Poole MRN: [id number] DOB: 1955-10-06  Jonathan Poole is a 66 y.o. year old male who is a primary care patient of Anabel Halon, MD and is actively engaged with the care management team. I reached out to Jonathan Poole by phone today to assist with scheduling a follow up visit with the Licensed Clinical Social Worker  Follow up plan: Face to Face appointment with care management team member scheduled for: 08/22/21  Jonathan Poole  Care Guide, Embedded Care Coordination [facility]  Care Management  Direct Dial: [phone]

## 2021-08-12 ENCOUNTER — Telehealth: Payer: Self-pay | Admitting: *Deleted

## 2021-08-12 NOTE — Chronic Care Management (AMB) (Signed)
  Chronic Care Management Note  08/12/2021 Name: Jonathan Poole MRN: [id number] DOB: 12/14/1955  Jonathan Poole is a 66 y.o. year old male who is a primary care patient of Anabel Halon, MD and is actively engaged with the care management team. I reached out to Krishan A Zundel by phone today to assist with re-scheduling a follow up visit with the Licensed Clinical Social Worker  Follow up plan: Unsuccessful telephone outreach attempt made. A HIPAA compliant phone message was left for the patient providing contact information and requesting a return call.  The care management team will reach out to the patient again over the next 7 days.  If patient returns call to provider office, please advise to call Embedded Care Management Care Guide [name] at [phone].  [name]  Care Guide, Embedded Care Coordination [facility] Management  Direct Dial: [phone]

## 2021-08-20 NOTE — Chronic Care Management (AMB) (Signed)
  Chronic Care Management Note  08/20/2021 Name: MATHIEU SCHLOEMER MRN: [id number] DOB: [birth year]  Pearletha Furl Weihe is a 66 y.o. year old male who is a primary care patient of Anabel Halon, MD and is actively engaged with the care management team. I reached out to Voyd A Fero by phone today to assist with re-scheduling a follow up visit with the Licensed Clinical Social Worker  Follow up plan: Unsuccessful telephone outreach attempt made. A HIPAA compliant phone message was left for the patient providing contact information and requesting a return call.  The care management team will reach out to the patient again over the next 7 days.  If patient returns call to provider office, please advise to call Embedded Care Management Care Guide [name] at [phone].  [name]  Care Guide [facility] Management  Direct Dial: [phone]

## 2021-08-22 ENCOUNTER — Ambulatory Visit: Payer: Medicare HMO

## 2021-08-23 NOTE — Chronic Care Management (AMB) (Signed)
  Chronic Care Management Note  08/23/2021 Name: RASHEEM FIGIEL MRN: [id number] DOB: Jun 15, 1955  Pearletha Furl Leeman is a 66 y.o. year old male who is a primary care patient of Anabel Halon, MD and is actively engaged with the care management team. I reached out to Arlando A Comer by phone today to assist with re-scheduling a follow up visit with the Licensed Clinical Social Worker  Follow up plan: Face to Face appointment with care management team member scheduled for: 08/29/21  [facility]  Care Coordination Care Guide  Direct Dial: [phone]

## 2021-08-29 ENCOUNTER — Ambulatory Visit (INDEPENDENT_AMBULATORY_CARE_PROVIDER_SITE_OTHER): Payer: Medicare HMO | Admitting: Licensed Clinical Social Worker

## 2021-08-29 DIAGNOSIS — I480 Paroxysmal atrial fibrillation: Secondary | ICD-10-CM

## 2021-08-29 DIAGNOSIS — E785 Hyperlipidemia, unspecified: Secondary | ICD-10-CM

## 2021-08-29 DIAGNOSIS — Z950 Presence of cardiac pacemaker: Secondary | ICD-10-CM

## 2021-08-29 DIAGNOSIS — M5136 Other intervertebral disc degeneration, lumbar region: Secondary | ICD-10-CM

## 2021-08-29 DIAGNOSIS — M51369 Other intervertebral disc degeneration, lumbar region without mention of lumbar back pain or lower extremity pain: Secondary | ICD-10-CM

## 2021-08-29 DIAGNOSIS — F02818 Dementia in other diseases classified elsewhere, unspecified severity, with other behavioral disturbance: Secondary | ICD-10-CM

## 2021-08-29 NOTE — Patient Instructions (Addendum)
Visit Information  Patient Goals:   Coping Skills enhanced. Manage memory issues. Manage pain issues  Time Frame:  Short Term Goal Priority:  Medium Progress:  On Track  Start Date:  05/03/21 End Date:  11/05/21    Follow Up Date:  10/10/21 at 2:00 PM   Coping skills enhanced. Manage memory issues. Manage pain issues  Patient Coping Skills Has support from his spouse, Darl Pikes , and from his son, Onalee Hua No transport needs Attends scheduled medical appointments  Patient Deficits:  Memory issues Pain issues (he spoke of back pain issues experienced)  Patient Goals: In next 30 days, patient will: Attend scheduled medical appointments Communicate with spouse, Darl Pikes, about his needs faced Allow time for rest and relaxation Take medications as prescribed Communicate, as needed with CCM staff for support   Follow Up Plan: LCSW to call client or spouse of client on 10/10/21 at 2:00 PM   Kelton Pillar.Mica Ramdass MSW, LCSW Licensed Visual merchandiser [facility] Care Management [phone]

## 2021-08-29 NOTE — Chronic Care Management (AMB) (Signed)
Chronic Care Management    Clinical Social Work Note  08/29/2021 Name: Jonathan Poole MRN: [id number] DOB: 06/09/1955  Jonathan Poole is a 66 y.o. year old male who is a primary care patient of Jonathan Halon, MD. The CCM team was consulted to assist the patient with chronic disease management and/or care coordination needs related to: [employer] .   Engaged with patient and with spouse of patient by telephone for follow up visit in response to provider referral for social work chronic care management and care coordination services.   Consent to Services:  The patient was given information about Chronic Care Management services, agreed to services, and gave verbal consent prior to initiation of services.  Please see initial visit note for detailed documentation.   Patient agreed to services and consent obtained.   Assessment: Review of patient past medical history, allergies, medications, and health status, including review of relevant consultants reports was performed today as part of a comprehensive evaluation and provision of chronic care management and care coordination services.     SDOH (Social Determinants of Health) assessments and interventions performed:  SDOH Interventions    Flowsheet Row Most Recent Value  SDOH Interventions   Stress Interventions Provide Counseling  [client has stress related to memory issues. client has some stress related to sleeping issues.]  Depression Interventions/Treatment  Counseling        Advanced Directives Status: See Vynca application for related entries.  CCM Care Plan  Allergies  Allergen Reactions   Penicillins     Unknown reaction    Outpatient Encounter Medications as of 08/29/2021  Medication Sig   acetaminophen (TYLENOL) 500 MG tablet Take 1,000 mg by mouth every 6 (six) hours as needed for moderate pain or headache.   donepezil (ARICEPT) 10 MG tablet Take 1 tablet (10 mg total) by mouth at bedtime.    doxycycline (VIBRA-TABS) 100 MG tablet Take (2) tablet PO for 1 day, then (1) tablet PO every 12 hrs for 5 days   ELIQUIS 5 MG TABS tablet TAKE (1) TABLET BY MOUTH TWICE DAILY.   gabapentin (NEURONTIN) 300 MG capsule Take 300 mg by mouth 2 (two) times daily.   gabapentin (NEURONTIN) 400 MG capsule Take 400 mg by mouth 3 (three) times daily.   ondansetron (ZOFRAN) 4 MG tablet Take 1 tablet (4 mg total) by mouth every 8 (eight) hours as needed for nausea or vomiting.   oxyCODONE (OXY IR/ROXICODONE) 5 MG immediate release tablet Take 5 mg by mouth every 6 (six) hours as needed for severe pain.   Oxycodone HCl 10 MG TABS Take 10 mg by mouth 4 (four) times daily as needed.   rosuvastatin (CRESTOR) 20 MG tablet TAKE (1) TABLET BY MOUTH ONCE DAILY.   tiZANidine (ZANAFLEX) 4 MG tablet Take 1 tablet (4 mg total) by mouth 3 (three) times daily.   No facility-administered encounter medications on file as of 08/29/2021.    Patient Active Problem List   Diagnosis Date Noted   Dog bite of arm, right, initial encounter 08/01/2021   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/22/2021   Chronic prescription opiate use 03/21/2021   H/O cardiac pacemaker 04/12/2020   PAF (paroxysmal atrial fibrillation) (HCC) 04/12/2020   Alzheimer's dementia with behavioral disturbance (HCC) 04/12/2020   DDD (degenerative disc disease), lumbar 04/12/2020   HLD (hyperlipidemia) 04/12/2020   Sinus node dysfunction (HCC) 01/02/2020   Elevated PSA 06/20/2019    Conditions to be addressed/monitored: monitor client management of memory issues and  daily tasks  Care Plan : Jonathan Poole Care Plan  Updates made by Jonathan Blakes, Jonathan Poole since 08/29/2021 12:00 AM     Problem: Coping Skills (General Plan of Care)      Goal: Coping Skills Enhanced. Manage pain issues in back; manage memory issues   Start Date: 05/03/2021  Expected End Date: 11/05/2021  This Visit's Progress: On track  Recent Progress: On track  Priority: Medium  Note:   Current  barriers:   Memory issues Depression issues Back pain issues  Clinical Goals:  Client will call Jonathan Poole in next 30 days to discuss coping skills of client Client will attend scheduled medical appointmetns in next 30 days Client will call RNCM as needed in next 30 days for nursing support Client to call Jonathan Poole in next 30 days to discuss memory issues of client and managing memory issues  Clinical Interventions:  Collaboration with Jonathan Halon, MD regarding development and update of comprehensive plan of care as evidenced by provider attestation and co-signature Assessment of needs, barriers of client. Discussed client needs with Jonathan Poole [place] and with Jonathan Poole (spouse).  Discussed back pain issues with client  He goes to [facility] for support Discussed ambulation of client. . Client said he is walking well.  Reviwed appetite of client and sleeping issues of client  Reviewed mood of client..  Client feels that his mood is stable. He is taking medications as prescribed.  He did not mention any mood problems Reviewed family support. He has support from his wife, Jonathan Poole ;and, he has support from his son, Jonathan Poole Reviewed relaxation techniques ( likes to watch sport events, cares for his pet dogs,) Reviewed medication procurement for client Discussed past work history with client. 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  Client scored 21/30 on MMSE.  Jonathan Poole talked with Jonathan Poole and with Jonathan Poole about memory challenges of client. Jonathan Poole talked with Jonathan Poole about use of calendar to manage activities and appointments. Discussed concentration of client. Client said he can focus and concentrate while watching TV show. Encouraged client to call RNCM as needed for nursing support  Discussed CCM program support with Jonathan Poole and with Jonathan Poole to call Jonathan Poole as needed in next 30 days for SW support Provided counseling support for  client  Patient Coping Skills Has support from his spouse, Jonathan Poole , and from his son, Jonathan Poole No transport needs Attends scheduled medical appointments  Patient Deficits:  Memory issues Pain issues (he spoke of back pain issues experienced)  Patient Goals: In next 30 days, patient will: Attend scheduled medical appointments Communicate with spouse, Jonathan Poole, about his needs faced Allow time for rest and relaxation Take medications as prescribed Communicate, as needed with CCM staff for support   Follow Up Plan: Jonathan Poole to call client or spouse, Jonathan Poole [place] on 10/10/21 at 2:00 PM      Kelton Pillar.Korinna Tat MSW, Jonathan Poole Licensed Visual merchandiser [facility] Care Management [phone]

## 2021-09-05 ENCOUNTER — Ambulatory Visit (INDEPENDENT_AMBULATORY_CARE_PROVIDER_SITE_OTHER): Payer: Medicare HMO

## 2021-09-05 DIAGNOSIS — Z0000 Encounter for general adult medical examination without abnormal findings: Secondary | ICD-10-CM

## 2021-09-05 NOTE — Patient Instructions (Signed)
Jonathan Poole , Thank you for taking time to come for your Medicare Wellness Visit. I appreciate your ongoing commitment to your health goals. Please review the following plan we discussed and let me know if I can assist you in the future.   Screening recommendations/referrals: Colonoscopy: completed Recommended yearly ophthalmology/optometry visit for glaucoma screening and checkup Recommended yearly dental visit for hygiene and checkup  Vaccinations: Influenza vaccine: completed Pneumococcal vaccine: declined Tdap vaccine: completed Shingles vaccine: declined    Advanced directives: patient declined  Conditions/risks identified: falls  Next appointment: 1 year  Preventive Care 15 Years and Older, Male Preventive care refers to lifestyle choices and visits with your health care provider that can promote health and wellness. What does preventive care include? A yearly physical exam. This is also called an annual well check. Dental exams once or twice a year. Routine eye exams. Ask your health care provider how often you should have your eyes checked. Personal lifestyle choices, including: Daily care of your teeth and gums. Regular physical activity. Eating a healthy diet. Avoiding tobacco and drug use. Limiting alcohol use. Practicing safe sex. Taking low doses of aspirin every day. Taking vitamin and mineral supplements as recommended by your health care provider. What happens during an annual well check? The services and screenings done by your health care provider during your annual well check will depend on your age, overall health, lifestyle risk factors, and family history of disease. Counseling  Your health care provider may ask you questions about your: Alcohol use. Tobacco use. Drug use. Emotional well-being. Home and relationship well-being. Sexual activity. Eating habits. History of falls. Memory and ability to understand (cognition). Work and work  Astronomer. Screening  You may have the following tests or measurements: Height, weight, and BMI. Blood pressure. Lipid and cholesterol levels. These may be checked every 5 years, or more frequently if you are over 41 years old. Skin check. Lung cancer screening. You may have this screening every year starting at age 16 if you have a 30-pack-year history of smoking and currently smoke or have quit within the past 15 years. Fecal occult blood test (FOBT) of the stool. You may have this test every year starting at age 84. Flexible sigmoidoscopy or colonoscopy. You may have a sigmoidoscopy every 5 years or a colonoscopy every 10 years starting at age 76. Prostate cancer screening. Recommendations will vary depending on your family history and other risks. Hepatitis C blood test. Hepatitis B blood test. Sexually transmitted disease (STD) testing. Diabetes screening. This is done by checking your blood sugar (glucose) after you have not eaten for a while (fasting). You may have this done every 1-3 years. Abdominal aortic aneurysm (AAA) screening. You may need this if you are a current or former smoker. Osteoporosis. You may be screened starting at age 68 if you are at high risk. Talk with your health care provider about your test results, treatment options, and if necessary, the need for more tests. Vaccines  Your health care provider may recommend certain vaccines, such as: Influenza vaccine. This is recommended every year. Tetanus, diphtheria, and acellular pertussis (Tdap, Td) vaccine. You may need a Td booster every 10 years. Zoster vaccine. You may need this after age 92. Pneumococcal 13-valent conjugate (PCV13) vaccine. One dose is recommended after age 81. Pneumococcal polysaccharide (PPSV23) vaccine. One dose is recommended after age 4. Talk to your health care provider about which screenings and vaccines you need and how often you need them. This information is  not intended to replace  advice given to you by your health care provider. Make sure you discuss any questions you have with your health care provider. Document Released: 02/23/2015 Document Revised: 10/17/2015 Document Reviewed: 11/28/2014 Elsevier Interactive Patient Education  2017 [place] Prevention in the Home Falls can cause injuries. They can happen to people of all ages. There are many things you can do to make your home safe and to help prevent falls. What can I do on the outside of my home? Regularly fix the edges of walkways and driveways and fix any cracks. Remove anything that might make you trip as you walk through a door, such as a raised step or threshold. Trim any bushes or trees on the path to your home. Use bright outdoor lighting. Clear any walking paths of anything that might make someone trip, such as rocks or tools. Regularly check to see if handrails are loose or broken. Make sure that both sides of any steps have handrails. Any raised decks and porches should have guardrails on the edges. Have any leaves, snow, or ice cleared regularly. Use sand or salt on walking paths during winter. Clean up any spills in your garage right away. This includes oil or grease spills. What can I do in the bathroom? Use night lights. Install grab bars by the toilet and in the tub and shower. Do not use towel bars as grab bars. Use non-skid mats or decals in the tub or shower. If you need to sit down in the shower, use a plastic, non-slip stool. Keep the floor dry. Clean up any water that spills on the floor as soon as it happens. Remove soap buildup in the tub or shower regularly. Attach bath mats securely with double-sided non-slip rug tape. Do not have throw rugs and other things on the floor that can make you trip. What can I do in the bedroom? Use night lights. Make sure that you have a light by your bed that is easy to reach. Do not use any sheets or blankets that are too big for your bed.  They should not hang down onto the floor. Have a firm chair that has side arms. You can use this for support while you get dressed. Do not have throw rugs and other things on the floor that can make you trip. What can I do in the kitchen? Clean up any spills right away. Avoid walking on wet floors. Keep items that you use a lot in easy-to-reach places. If you need to reach something above you, use a strong step stool that has a grab bar. Keep electrical cords out of the way. Do not use floor polish or wax that makes floors slippery. If you must use wax, use non-skid floor wax. Do not have throw rugs and other things on the floor that can make you trip. What can I do with my stairs? Do not leave any items on the stairs. Make sure that there are handrails on both sides of the stairs and use them. Fix handrails that are broken or loose. Make sure that handrails are as long as the stairways. Check any carpeting to make sure that it is firmly attached to the stairs. Fix any carpet that is loose or worn. Avoid having throw rugs at the top or bottom of the stairs. If you do have throw rugs, attach them to the floor with carpet tape. Make sure that you have a light switch at the top of the  stairs and the bottom of the stairs. If you do not have them, ask someone to add them for you. What else can I do to help prevent falls? Wear shoes that: Do not have high heels. Have rubber bottoms. Are comfortable and fit you well. Are closed at the toe. Do not wear sandals. If you use a stepladder: Make sure that it is fully opened. Do not climb a closed stepladder. Make sure that both sides of the stepladder are locked into place. Ask someone to hold it for you, if possible. Clearly mark and make sure that you can see: Any grab bars or handrails. First and last steps. Where the edge of each step is. Use tools that help you move around (mobility aids) if they are needed. These  include: Canes. Walkers. Scooters. Crutches. Turn on the lights when you go into a dark area. Replace any light bulbs as soon as they burn out. Set up your furniture so you have a clear path. Avoid moving your furniture around. If any of your floors are uneven, fix them. If there are any pets around you, be aware of where they are. Review your medicines with your doctor. Some medicines can make you feel dizzy. This can increase your chance of falling. Ask your doctor what other things that you can do to help prevent falls. This information is not intended to replace advice given to you by your health care provider. Make sure you discuss any questions you have with your health care provider. Document Released: 11/23/2008 Document Revised: 07/05/2015 Document Reviewed: 03/03/2014 Elsevier Interactive Patient Education  2017 [employer].

## 2021-09-05 NOTE — Progress Notes (Signed)
Subjective:   Jonathan Poole is a 66 y.o. male who presents for an Initial Medicare Annual Wellness Visit.  I connected with  Jonathan Poole on 09/05/21 by a audio enabled telemedicine application and verified that I am speaking with the correct person using two identifiers.  Patient Location: Home  Provider Location: Office/Clinic  I discussed the limitations of evaluation and management by telemedicine. The patient expressed understanding and agreed to proceed.  Review of Systems     Jonathan Poole , Thank you for taking time to come for your Medicare Wellness Visit. I appreciate your ongoing commitment to your health goals. Please review the following plan we discussed and let me know if I can assist you in the future.   These are the goals we discussed:  Goals       Coping Skills Enhanced. Manage memory issues. Manage pain issues (pt-stated)      Time Frame:  Short Term Goal Priority:  Medium Progress:  On Track  Start Date:  05/03/21 End Date:  11/05/21    Follow Up Date:  10/10/21 at 2:00 PM   Coping skills enhanced. Manage memory issues. Manage pain issues  Patient Coping Skills Has support from his spouse, Jonathan Poole , and from his son, Jonathan Poole No transport needs Attends scheduled medical appointments  Patient Deficits:  Memory issues Pain issues (he spoke of back pain issues experienced)  Patient Goals: In next 30 days, patient will: Attend scheduled medical appointments Communicate with spouse, Jonathan Poole, about his needs faced Allow time for rest and relaxation Take medications as prescribed Communicate, as needed with CCM staff for support   Follow Up Plan: LCSW to call client or spouse of client on 10/10/21 at 2:00 PM       Patient Stated      Would like to continue to stay busy with his pets         This is a list of the screening recommended for you and due dates:  Health Maintenance  Topic Date Due   COVID-19 Vaccine (1) Never done   Hepatitis  C Screening: USPSTF Recommendation to screen - Ages 68-79 yo.  Never done   Colon Cancer Screening  Never done   Zoster (Shingles) Vaccine (1 of 2) Never done   Pneumonia Vaccine (1 - PCV) Never done   Flu Shot  09/10/2021   Tetanus Vaccine  08/01/2031   HPV Vaccine  Aged Out          Objective:    There were no vitals filed for this visit. There is no height or weight on file to calculate BMI.     09/04/2020    2:42 PM  Advanced Directives  Does Patient Have a Medical Advance Directive? No  Would patient like information on creating a medical advance directive? No - Patient declined    Current Medications (verified) Outpatient Encounter Medications as of 09/05/2021  Medication Sig   acetaminophen (TYLENOL) 500 MG tablet Take 1,000 mg by mouth every 6 (six) hours as needed for moderate pain or headache.   donepezil (ARICEPT) 10 MG tablet Take 1 tablet (10 mg total) by mouth at bedtime.   doxycycline (VIBRA-TABS) 100 MG tablet Take (2) tablet PO for 1 day, then (1) tablet PO every 12 hrs for 5 days   ELIQUIS 5 MG TABS tablet TAKE (1) TABLET BY MOUTH TWICE DAILY.   gabapentin (NEURONTIN) 300 MG capsule Take 300 mg by mouth 2 (two) times daily.   gabapentin (  NEURONTIN) 400 MG capsule Take 400 mg by mouth 3 (three) times daily.   ondansetron (ZOFRAN) 4 MG tablet Take 1 tablet (4 mg total) by mouth every 8 (eight) hours as needed for nausea or vomiting.   oxyCODONE (OXY IR/ROXICODONE) 5 MG immediate release tablet Take 5 mg by mouth every 6 (six) hours as needed for severe pain.   Oxycodone HCl 10 MG TABS Take 10 mg by mouth 4 (four) times daily as needed.   rosuvastatin (CRESTOR) 20 MG tablet TAKE (1) TABLET BY MOUTH ONCE DAILY.   tiZANidine (ZANAFLEX) 4 MG tablet Take 1 tablet (4 mg total) by mouth 3 (three) times daily.   No facility-administered encounter medications on file as of 09/05/2021.    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/2020   Procedure: PPM GENERATOR CHANGEOUT;  Surgeon: Marinus Maw, MD;  Location: [facility];  Service: Cardiovascular;  Laterality: N/A;   Family History  Problem Relation Age of Onset   Heart disease Father    Prostate cancer Paternal Grandfather    Social History   Socioeconomic History   Marital status: Married    Spouse name: Not on file   Number of children: 3   Years of education: Not on file   Highest education level: Not on file  Occupational History   Occupation: retired  Tobacco Use   Smoking status: Former    Packs/day: 1.00    Years: 15.00    Total pack years: 15.00    Types: Cigarettes    Quit date: 2001    Years since quitting: 22.5   Smokeless tobacco: Never  Vaping Use   Vaping Use: Some days  Substance and Sexual Activity   Alcohol use: Never   Drug use: Not Currently    Types: Marijuana   Sexual activity: Not on file  Other Topics Concern   Not on file  Social History Narrative   Not on file   Social Determinants of Health   Financial Resource Strain: Low Risk  (09/04/2020)   Overall Financial Resource Strain (CARDIA)    Difficulty of Paying Living Expenses: Not hard at all  Food Insecurity: No Food Insecurity (09/04/2020)   Hunger Vital Sign    Worried About Running Out of Food in the Last Year: Never true    Ran Out of Food in the Last Year: Never true  Transportation Needs: No Transportation Needs (09/04/2020)   PRAPARE - Administrator, Civil Service (Medical): No    Lack of Transportation (Non-Medical): No  Physical Activity: Sufficiently Active (09/04/2020)   Exercise Vital Sign    Days of Exercise per Week: 3 days    Minutes of Exercise per Session: 50 min  Stress: Stress Concern Present (08/29/2021)   [employer] - Occupational Stress Questionnaire    Feeling of Stress : To some extent  Social Connections: Moderately Isolated (09/04/2020)   Social Connection and Isolation Panel [NHANES]    Frequency of Communication with Friends and Family: More than three times a week    Frequency of Social Gatherings with Friends and Family: More than three times a week    Attends Religious Services: Never  Active Member of Clubs or Organizations: No    Attends Archivist Meetings: Never    Marital Status: Married    Tobacco Counseling Counseling given: Not Answered   Clinical Intake:                 Diabetic? No         Activities of Daily Living     No data to display           Patient Care Team: Lindell Spar, MD as PCP - General (Internal Medicine) Evans Lance, MD as PCP - Electrophysiology (Cardiology) Gala Romney Cristopher Estimable, MD as Consulting Physician (Gastroenterology) Shea Evans Norva Riffle, LCSW as [place] Management (Licensed Clinical Social Worker)  Indicate any recent Wing you may have received from other than [facility] providers in the past year (date may be approximate).     Assessment:   This is a routine wellness examination for [place].  Hearing/Vision screen No results found.  Dietary issues and exercise activities discussed:     Goals Addressed   None   Depression Screen    08/29/2021    1:43 PM 07/31/2021    2:31 PM 05/03/2021    1:10 PM 03/21/2021    1:21 PM 09/04/2020    2:43 PM 09/04/2020    2:41 PM 07/13/2020   10:49 AM  PHQ 2/9 Scores  PHQ - 2 Score 2 0 2 0 0 0 0  PHQ- 9 Score 8  6        Fall Risk    07/31/2021    2:31 PM 03/21/2021    1:21 PM 09/04/2020    2:43 PM 07/13/2020   10:49 AM 04/12/2020   10:50 AM  [place] in the past year? 0 0 0 0 0  Number falls in past yr: 0 0 0 0 0  Injury with Fall? 0 0 0 0 0  Risk for fall due to : No Fall Risks No Fall Risks No  Fall Risks No Fall Risks No Fall Risks  Follow up Falls evaluation completed Falls evaluation completed Falls evaluation completed Falls evaluation completed Falls evaluation completed    FALL RISK PREVENTION PERTAINING TO THE HOME:  Any stairs in or around the home? No  If so, are there any without handrails? Yes  Home free of loose throw rugs in walkways, pet beds, electrical cords, etc? Yes  Adequate lighting in your home to reduce risk of falls? Yes   ASSISTIVE DEVICES UTILIZED TO PREVENT FALLS:  Life alert? No  Use of a cane, walker or w/c? No  Grab bars in the bathroom? No  Shower chair or bench in shower? No  Elevated toilet seat or a handicapped toilet? No     Cognitive Function:    03/21/2021    2:39 PM 09/04/2020    2:44 PM  MMSE - Mini Mental State Exam  Not completed:  Unable to complete  Orientation to time 0   Orientation to Place 5   Registration 3   Attention/ Calculation 3   Recall 2   Language- name 2 objects 2   Language- repeat 1   Language- follow 3 step command 3   Language- read & follow direction 1   Write a sentence 1   Copy design 1   Total score 22         09/04/2020    2:44 PM  6CIT Screen  What Year? 0 points  What month? 0 points  What time? 0 points  Count back from 20 0 points  Months in reverse 4 points  Repeat phrase 0 points  Total Score 4 points    Immunizations Immunization History  Administered Date(s) Administered   Tdap 07/31/2021    TDAP status: Up to date  Flu Vaccine status: Up to date  Pneumococcal vaccine status: Declined,  Education has been provided regarding the importance of this vaccine but patient still declined. Advised may receive this vaccine at local pharmacy or Health Dept. Aware to provide a copy of the vaccination record if obtained from local pharmacy or Health Dept. Verbalized acceptance and understanding.   Covid-19 vaccine status: Declined, Education has been provided regarding the importance  of this vaccine but patient still declined. Advised may receive this vaccine at local pharmacy or Health Dept.or vaccine clinic. Aware to provide a copy of the vaccination record if obtained from local pharmacy or Health Dept. Verbalized acceptance and understanding.  Qualifies for Shingles Vaccine? Yes   Zostavax completed No   Shingrix Completed?: No.    Education has been provided regarding the importance of this vaccine. Patient has been advised to call insurance company to determine out of pocket expense if they have not yet received this vaccine. Advised may also receive vaccine at local pharmacy or Health Dept. Verbalized acceptance and understanding.  Screening Tests Health Maintenance  Topic Date Due   COVID-19 Vaccine (1) Never done   Hepatitis C Screening  Never done   COLONOSCOPY (Pts 45-70yrs Insurance coverage will need to be confirmed)  Never done   Zoster Vaccines- Shingrix (1 of 2) Never done   Pneumonia Vaccine 83+ Years old (1 - PCV) Never done   INFLUENZA VACCINE  09/10/2021   TETANUS/TDAP  08/01/2031   HPV VACCINES  Aged Out    Health Maintenance  Health Maintenance Due  Topic Date Due   COVID-19 Vaccine (1) Never done   Hepatitis C Screening  Never done   COLONOSCOPY (Pts 45-73yrs Insurance coverage will need to be confirmed)  Never done   Zoster Vaccines- Shingrix (1 of 2) Never done   Pneumonia Vaccine 15+ Years old (1 - PCV) Never done    Colorectal cancer screening: Type of screening: Cologuard. Completed 03/21/2021. Repeat every 3 years  Lung Cancer Screening: (Low Dose CT Chest recommended if Age 55-80 years, 30 pack-year currently smoking OR have quit w/in 15years.) does qualify.     Additional Screening:  Hepatitis C Screening: does qualify; Completed   Vision Screening: Recommended annual ophthalmology exams for early detection of glaucoma and other disorders of the eye. Is the patient up to date with their annual eye exam?  No  If pt is not  established with a provider, would they like to be referred to a provider to establish care? No .   Dental Screening: Recommended annual dental exams for proper oral hygiene  Community Resource Referral / Chronic Care Management: CRR required this visit?  No   CCM required this visit?  No      Plan:     I have personally reviewed and noted the following in the patient's chart:   Medical and social history Use of alcohol, tobacco or illicit drugs  Current medications and supplements including opioid prescriptions. Patient is currently taking opioid prescriptions. Information provided to patient regarding non-opioid alternatives. Patient advised to discuss non-opioid treatment plan with their provider. Functional ability and status Nutritional status Physical activity Advanced directives List of other  physicians Hospitalizations, surgeries, and ER visits in previous 12 months Vitals Screenings to include cognitive, depression, and falls Referrals and appointments  In addition, I have reviewed and discussed with patient certain preventive protocols, quality metrics, and best practice recommendations. A written personalized care plan for preventive services as well as general preventive health recommendations were provided to patient.     Johny Drilling, [place]   09/05/2021   Nurse Notes:  Mr. Vana , Thank you for taking time to come for your Medicare Wellness Visit. I appreciate your ongoing commitment to your health goals. Please review the following plan we discussed and let me know if I can assist you in the future.   These are the goals we discussed:  Goals       Coping Skills Enhanced. Manage memory issues. Manage pain issues (pt-stated)      Time Frame:  Short Term Goal Priority:  Medium Progress:  On Track  Start Date:  05/03/21 End Date:  11/05/21    Follow Up Date:  10/10/21 at 2:00 PM   Coping skills enhanced. Manage memory issues. Manage pain  issues  Patient Coping Skills Has support from his spouse, Jonathan Poole , and from his son, Jonathan Poole No transport needs Attends scheduled medical appointments  Patient Deficits:  Memory issues Pain issues (he spoke of back pain issues experienced)  Patient Goals: In next 30 days, patient will: Attend scheduled medical appointments Communicate with spouse, Jonathan Poole, about his needs faced Allow time for rest and relaxation Take medications as prescribed Communicate, as needed with CCM staff for support   Follow Up Plan: LCSW to call client or spouse of client on 10/10/21 at 2:00 PM       Patient Stated      Would like to continue to stay busy with his pets         This is a list of the screening recommended for you and due dates:  Health Maintenance  Topic Date Due   COVID-19 Vaccine (1) Never done   Hepatitis C Screening: USPSTF Recommendation to screen - Ages 55-79 yo.  Never done   Colon Cancer Screening  Never done   Zoster (Shingles) Vaccine (1 of 2) Never done   Pneumonia Vaccine (1 - PCV) Never done   Flu Shot  09/10/2021   Tetanus Vaccine  08/01/2031   HPV Vaccine  Aged Out

## 2021-09-09 DIAGNOSIS — G309 Alzheimer's disease, unspecified: Secondary | ICD-10-CM | POA: Diagnosis not present

## 2021-09-09 DIAGNOSIS — I480 Paroxysmal atrial fibrillation: Secondary | ICD-10-CM

## 2021-09-09 DIAGNOSIS — F02818 Dementia in other diseases classified elsewhere, unspecified severity, with other behavioral disturbance: Secondary | ICD-10-CM

## 2021-09-09 DIAGNOSIS — E785 Hyperlipidemia, unspecified: Secondary | ICD-10-CM

## 2021-10-01 ENCOUNTER — Ambulatory Visit (INDEPENDENT_AMBULATORY_CARE_PROVIDER_SITE_OTHER): Payer: Medicare HMO

## 2021-10-01 DIAGNOSIS — I495 Sick sinus syndrome: Secondary | ICD-10-CM | POA: Diagnosis not present

## 2021-10-03 LAB — CUP PACEART REMOTE DEVICE CHECK
Battery Remaining Longevity: 97 mo
Battery Remaining Percentage: 85 %
Battery Voltage: 3.01 V
Brady Statistic AP VP Percent: 1 %
Brady Statistic AP VS Percent: 96 %
Brady Statistic AS VP Percent: 1 %
Brady Statistic AS VS Percent: 2.9 %
Brady Statistic RA Percent Paced: 96 %
Brady Statistic RV Percent Paced: 1 %
Date Time Interrogation Session: 20230823190055
Implantable Lead Implant Date: 20100504
Implantable Lead Implant Date: 20100524
Implantable Lead Location: 753859
Implantable Lead Location: 753860
Implantable Pulse Generator Implant Date: 20211122
Lead Channel Impedance Value: 390 Ohm
Lead Channel Impedance Value: 460 Ohm
Lead Channel Pacing Threshold Amplitude: 0.75 V
Lead Channel Pacing Threshold Amplitude: 1.125 V
Lead Channel Pacing Threshold Pulse Width: 0.3 ms
Lead Channel Pacing Threshold Pulse Width: 0.7 ms
Lead Channel Sensing Intrinsic Amplitude: 3.1 mV
Lead Channel Sensing Intrinsic Amplitude: 5 mV
Lead Channel Setting Pacing Amplitude: 1.375
Lead Channel Setting Pacing Amplitude: 1.5 V
Lead Channel Setting Pacing Pulse Width: 0.7 ms
Lead Channel Setting Sensing Sensitivity: 0.7 mV
Pulse Gen Model: 2272
Pulse Gen Serial Number: 3864825

## 2021-10-10 ENCOUNTER — Telehealth: Payer: Medicare HMO

## 2021-10-29 NOTE — Progress Notes (Signed)
Remote pacemaker transmission.   

## 2021-11-04 ENCOUNTER — Telehealth: Payer: Self-pay | Admitting: Internal Medicine

## 2021-11-04 NOTE — Telephone Encounter (Signed)
Pt wife calling for samples, pt states she can pick them up tomorrow morning

## 2021-11-04 NOTE — Telephone Encounter (Signed)
Pt requesting samples for Eliquis:   Hx: afib OV: 04/10/2020, Taylor Scr: 1.00, 03/21/2021 Weight:79.6kg Age:66 yo   Pt is on the correct dose of Eliquis per dosing criteria. Pt is overdue to see cardiologist- pt is scheduled to see Dr. Lovena Le on 11/26/2021.

## 2021-11-04 NOTE — Telephone Encounter (Signed)
Patient calling the office for samples of medication:   1.  What medication and dosage are you requesting samples for? ELIQUIS 5 MG TABS tablet   2.  Are you currently out of this medication? Yes    

## 2021-11-04 NOTE — Telephone Encounter (Signed)
This is a [place] pt.  °

## 2021-11-05 NOTE — Telephone Encounter (Signed)
Eliquis (#14) Lot#: [id number] Exp: 07/2023  Samples provided for pt- at the front desk waiting for pickup.

## 2021-11-06 ENCOUNTER — Other Ambulatory Visit: Payer: Self-pay | Admitting: Internal Medicine

## 2021-11-06 DIAGNOSIS — G309 Alzheimer's disease, unspecified: Secondary | ICD-10-CM

## 2021-11-07 ENCOUNTER — Other Ambulatory Visit: Payer: Self-pay | Admitting: *Deleted

## 2021-11-07 DIAGNOSIS — E785 Hyperlipidemia, unspecified: Secondary | ICD-10-CM

## 2021-11-07 MED ORDER — ROSUVASTATIN CALCIUM 20 MG PO TABS: ORAL_TABLET | ORAL | 0 refills | Status: DC

## 2021-11-08 ENCOUNTER — Other Ambulatory Visit: Payer: Self-pay | Admitting: Internal Medicine

## 2021-11-08 DIAGNOSIS — E785 Hyperlipidemia, unspecified: Secondary | ICD-10-CM

## 2021-11-11 ENCOUNTER — Telehealth: Payer: Self-pay | Admitting: Internal Medicine

## 2021-11-11 NOTE — Telephone Encounter (Signed)
Pt's wife came in today and picked up samples. She was wanting to know if they could have another box to hold them over until they get the paperwork for assistance. Eliquis 5 MG

## 2021-11-12 NOTE — Telephone Encounter (Signed)
Pt requesting samples for Eliquis:    Hx: afib OV: 04/10/2020, Taylor Scr: 1.00, 03/21/2021 Weight:79.6kg Age:66 yo    Pt is on the correct dose of Eliquis per dosing criteria. OK to provide samples if available.

## 2021-11-12 NOTE — Telephone Encounter (Signed)
Two samples boxes of Eliquis 5 mg Lot # [id number], Exp: Jun 2025 placed at front desk for pick up.

## 2021-11-12 NOTE — Telephone Encounter (Signed)
Wife notified that samples a available for pick up. We will also provide a copy of the pt assistance form for [employer].

## 2021-11-21 ENCOUNTER — Other Ambulatory Visit (HOSPITAL_COMMUNITY): Payer: Self-pay | Admitting: Physician Assistant

## 2021-11-21 ENCOUNTER — Ambulatory Visit (HOSPITAL_COMMUNITY)
Admission: RE | Admit: 2021-11-21 | Discharge: 2021-11-21 | Disposition: A | Discharge status: Home / Self Care | Payer: Medicare HMO | Source: Ambulatory Visit | Attending: Physician Assistant | Admitting: Physician Assistant

## 2021-11-21 DIAGNOSIS — M5117 Intervertebral disc disorders with radiculopathy, lumbosacral region: Secondary | ICD-10-CM | POA: Insufficient documentation

## 2021-11-26 ENCOUNTER — Encounter: Payer: Self-pay | Admitting: Internal Medicine

## 2021-11-26 ENCOUNTER — Encounter: Payer: Medicare HMO | Admitting: Internal Medicine

## 2021-11-30 ENCOUNTER — Encounter: Payer: Self-pay | Admitting: Internal Medicine

## 2021-12-03 ENCOUNTER — Telehealth: Payer: Self-pay | Admitting: Internal Medicine

## 2021-12-03 NOTE — Telephone Encounter (Signed)
° ° °  Patient calling the office for samples of medication: ° ° °1.  What medication and dosage are you requesting samples for? °ELIQUIS 5 MG TABS tablet ° °2.  Are you currently out of this medication? yes ° ° °

## 2021-12-03 NOTE — Telephone Encounter (Signed)
Wife was returning call. Please advise ?

## 2021-12-03 NOTE — Telephone Encounter (Signed)
Left a message for patient to call office regarding samples of Eliquis.   At this time, the [facility] office is currently out of samples.

## 2021-12-04 NOTE — Telephone Encounter (Signed)
Left a message for patient to call office regarding samples of Eliquis.

## 2021-12-10 NOTE — Telephone Encounter (Signed)
Wife is calling to follow-up on the patient's Eliquis samples.  Wife stated the manufacturer sent them a letter stating they did not receive the "doctor's page" of the application.  Wife would like that information re-sent.

## 2021-12-10 NOTE — Telephone Encounter (Signed)
Spoke to wife, verbalized that we can re-fax providers portion of application to manufacturer.

## 2021-12-11 NOTE — Telephone Encounter (Signed)
Spoke with Jonathan Poole with [employer] ( Eliquis). All portions of application were sent. Pt needs documentation of 3% out of pocket prescription expenses.   Returned call to [name] ( wife). Notified that total out of pocket expenses that they need to show proof of is $989.92. Wife thankful for return call.

## 2021-12-31 ENCOUNTER — Ambulatory Visit (INDEPENDENT_AMBULATORY_CARE_PROVIDER_SITE_OTHER): Payer: Medicare HMO

## 2021-12-31 DIAGNOSIS — I495 Sick sinus syndrome: Secondary | ICD-10-CM | POA: Diagnosis not present

## 2021-12-31 LAB — CUP PACEART REMOTE DEVICE CHECK
Battery Remaining Longevity: 98 mo
Battery Remaining Percentage: 83 %
Battery Voltage: 3.01 V
Brady Statistic AP VP Percent: 1.1 %
Brady Statistic AP VS Percent: 96 %
Brady Statistic AS VP Percent: 1 %
Brady Statistic AS VS Percent: 2.7 %
Brady Statistic RA Percent Paced: 96 %
Brady Statistic RV Percent Paced: 1.1 %
Date Time Interrogation Session: 20231121040036
Implantable Lead Connection Status: 753985
Implantable Lead Connection Status: 753985
Implantable Lead Implant Date: 20100504
Implantable Lead Implant Date: 20100524
Implantable Lead Location: 753859
Implantable Lead Location: 753860
Implantable Pulse Generator Implant Date: 20211122
Lead Channel Impedance Value: 480 Ohm
Lead Channel Impedance Value: 550 Ohm
Lead Channel Pacing Threshold Amplitude: 0.75 V
Lead Channel Pacing Threshold Amplitude: 1.125 V
Lead Channel Pacing Threshold Pulse Width: 0.3 ms
Lead Channel Pacing Threshold Pulse Width: 0.7 ms
Lead Channel Sensing Intrinsic Amplitude: 5 mV
Lead Channel Sensing Intrinsic Amplitude: 5.8 mV
Lead Channel Setting Pacing Amplitude: 1.375
Lead Channel Setting Pacing Amplitude: 1.5 V
Lead Channel Setting Pacing Pulse Width: 0.7 ms
Lead Channel Setting Sensing Sensitivity: 0.7 mV
Pulse Gen Model: 2272
Pulse Gen Serial Number: 3864825

## 2022-01-07 ENCOUNTER — Ambulatory Visit: Payer: Medicare HMO | Attending: Internal Medicine | Admitting: Internal Medicine

## 2022-01-07 ENCOUNTER — Other Ambulatory Visit: Payer: Self-pay | Admitting: *Deleted

## 2022-01-07 ENCOUNTER — Encounter: Payer: Self-pay | Admitting: Internal Medicine

## 2022-01-07 VITALS — BP 116/64 | HR 70 | Ht 72.0 in | Wt 174.0 lb

## 2022-01-07 DIAGNOSIS — I480 Paroxysmal atrial fibrillation: Secondary | ICD-10-CM

## 2022-01-07 MED ORDER — RIVAROXABAN 20 MG PO TABS: 20.0000 mg | ORAL_TABLET | Freq: Every day | ORAL | 11 refills | Status: DC

## 2022-01-07 NOTE — Patient Instructions (Addendum)
Medication Instructions:  Your physician recommends that you continue on your current medications as directed. Please refer to the Current Medication list given to you today.  Stop Taking Eliquis   Start Taking Xarelto 20 mg Daily at [place]   *If you need a refill on your cardiac medications before your next appointment, please call your pharmacy*   Lab Work: NONE   If you have labs (blood work) drawn today and your tests are completely normal, you will receive your results only by: MyChart Message (if you have MyChart) OR A paper copy in the mail If you have any lab test that is abnormal or we need to change your treatment, we will call you to review the results.   Testing/Procedures: NONE    Follow-Up: At [facility], you and your health needs are our priority.  As part of our continuing mission to provide you with exceptional heart care, we have created designated Provider Care Teams.  These Care Teams include your primary Cardiologist (physician) and Advanced Practice Providers (APPs -  Physician Assistants and Nurse Practitioners) who all work together to provide you with the care you need, when you need it.  We recommend signing up for the patient portal called "MyChart".  Sign up information is provided on this After Visit Summary.  MyChart is used to connect with patients for Virtual Visits (Telemedicine).  Patients are able to view lab/test results, encounter notes, upcoming appointments, etc.  Non-urgent messages can be sent to your provider as well.   To learn more about what you can do with MyChart, go to [email or web address].    Your next appointment:   1 year(s)  The format for your next appointment:   In Person  Provider:   Lewayne Bunting, MD    Other Instructions Thank you for choosing [facility]!    Important Information About Sugar

## 2022-01-07 NOTE — Progress Notes (Signed)
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 He has moved to [facility] from [place]. He underwent gen change out over 2 years ago. In the interim, he notes . He has a h/o some depressed mood as well. His wife notes that he has some anxiety. He was found to have atrial fib but did not take the eliquis he was prescribed.    Allergies  Allergen Reactions   Penicillins     Unknown reaction     Current Outpatient Medications  Medication Sig Dispense Refill   acetaminophen (TYLENOL) 500 MG tablet Take 1,000 mg by mouth every 6 (six) hours as needed for moderate pain or headache.     donepezil (ARICEPT) 10 MG tablet TAKE 1 TABLET AT BEDTIME 90 tablet 1   doxycycline (VIBRA-TABS) 100 MG tablet Take (2) tablet PO for 1 day, then (1) tablet PO every 12 hrs for 5 days 12 tablet 0   ELIQUIS 5 MG TABS tablet TAKE (1) TABLET BY MOUTH TWICE DAILY. 60 tablet 11   gabapentin (NEURONTIN) 300 MG capsule Take 300 mg by mouth 2 (two) times daily.     gabapentin (NEURONTIN) 400 MG capsule Take 400 mg by mouth 3 (three) times daily.     ondansetron (ZOFRAN) 4 MG tablet Take 1 tablet (4 mg total) by mouth every 8 (eight) hours as needed for nausea or vomiting. 20 tablet 0   oxyCODONE (OXY IR/ROXICODONE) 5 MG immediate release tablet Take 5 mg by mouth every 6 (six) hours as needed for severe pain.     Oxycodone HCl 10 MG TABS Take 10 mg by mouth 4 (four) times daily as needed.     rosuvastatin (CRESTOR) 20 MG tablet TAKE (1) TABLET BY MOUTH ONCE DAILY. 90 tablet 0   tiZANidine (ZANAFLEX) 4 MG tablet Take 1 tablet (4 mg total) by mouth 3 (three) times daily. 30 tablet 1   No current facility-administered medications for this visit.     Past Medical History:  Diagnosis Date   Atrial fibrillation (HCC)    Dementia (HCC)    Heart disease    Hyperchloremia     ROS:   All systems reviewed and negative except as noted in the HPI.   Past Surgical History:   Procedure Laterality Date   PACEMAKER IMPLANT     PPM GENERATOR CHANGEOUT N/A 01/02/2020   Procedure: PPM GENERATOR CHANGEOUT;  Surgeon: Marinus Maw, MD;  Location: [facility] INVASIVE CV LAB;  Service: Cardiovascular;  Laterality: N/A;     Family History  Problem Relation Age of Onset   Heart disease Father    Prostate cancer Paternal Grandfather      Social History   Socioeconomic History   Marital status: Married    Spouse name: Not on file   Number of children: 3   Years of education: Not on file   Highest education level: Not on file  Occupational History   Occupation: retired  Tobacco Use   Smoking status: Former    Packs/day: 1.00    Years: 15.00    Total pack years: 15.00    Types: Cigarettes    Quit date: 2001    Years since quitting: 22.9   Smokeless tobacco: Never  Vaping Use   Vaping Use: Some days  Substance and Sexual Activity   Alcohol use: Never   Drug use: Not Currently    Types: Marijuana   Sexual activity: Not on file  Other  Topics Concern   Not on file  Social History Narrative   Not on file   Social Determinants of Health   Financial Resource Strain: Low Risk  (09/05/2021)   Overall Financial Resource Strain (CARDIA)    Difficulty of Paying Living Expenses: Not hard at all  Food Insecurity: No Food Insecurity (09/05/2021)   Hunger Vital Sign    Worried About Running Out of Food in the Last Year: Never true    Ran Out of Food in the Last Year: Never true  Transportation Needs: No Transportation Needs (09/05/2021)   PRAPARE - Hydrologist (Medical): No    Lack of Transportation (Non-Medical): No  Physical Activity: Sufficiently Active (09/05/2021)   Exercise Vital Sign    Days of Exercise per Week: 7 days    Minutes of Exercise per Session: 50 min  Stress: No Stress Concern Present (09/05/2021)   [place]    Feeling of Stress : Not at all  Recent  Concern: Stress - Stress Concern Present (08/29/2021)   [place]    Feeling of Stress : To some extent  Social Connections: Moderately Integrated (09/05/2021)   Social Connection and Isolation Panel [NHANES]    Frequency of Communication with Friends and Family: More than three times a week    Frequency of Social Gatherings with Friends and Family: Three times a week    Attends Religious Services: Never    Active Member of Clubs or Organizations: Yes    Attends Archivist Meetings: Never    Marital Status: Married  Human resources officer Violence: Not At Risk (09/05/2021)   Humiliation, Afraid, Rape, and Kick questionnaire    Fear of Current or Ex-Partner: No    Emotionally Abused: No    Physically Abused: No    Sexually Abused: No     Ht 6' (1.829 m)   Wt 174 lb (78.9 kg)   BMI 23.60 kg/m   Physical Exam:  Well appearing NAD HEENT: Unremarkable Neck:  No JVD, no thyromegally Lymphatics:  No adenopathy Back:  No CVA tenderness Lungs:  Clear HEART:  Regular rate rhythm, no murmurs, no rubs, no clicks Abd:  soft, positive bowel sounds, no organomegally, no rebound, no guarding Ext:  2 plus pulses, no edema, no cyanosis, no clubbing Skin:  No rashes no nodules Neuro:  CN II through XII intact, motor grossly intact  EKG  DEVICE  Normal device function.  See PaceArt for details.   Assess/Plan:.  1. PAF - I had a long explanation for the rationale for his systemic anti-coagulation. I think he will take the eliquis. 2. Sinus node dysfunction - he is asymptomatic,s/p PPM 3. PPM - we reprogrammed his [employer] Jude PPM today to maximize battery longevity. 4. Fatigue - we turned his rate up to 70/min. He is not sure it helped but we will leave it there. I strongly encouraged the patient to walk daily for 30 minutes.   Carleene Overlie Barbette Mcglaun,MD

## 2022-01-24 NOTE — Progress Notes (Signed)
Remote pacemaker transmission.   

## 2022-02-05 ENCOUNTER — Ambulatory Visit (INDEPENDENT_AMBULATORY_CARE_PROVIDER_SITE_OTHER): Payer: Medicare HMO | Admitting: Internal Medicine

## 2022-02-05 ENCOUNTER — Encounter: Payer: Self-pay | Admitting: Internal Medicine

## 2022-02-05 VITALS — BP 129/83 | HR 95 | Ht 72.0 in | Wt 176.0 lb

## 2022-02-05 DIAGNOSIS — E782 Mixed hyperlipidemia: Secondary | ICD-10-CM | POA: Diagnosis not present

## 2022-02-05 DIAGNOSIS — G309 Alzheimer's disease, unspecified: Secondary | ICD-10-CM

## 2022-02-05 DIAGNOSIS — F02818 Dementia in other diseases classified elsewhere, unspecified severity, with other behavioral disturbance: Secondary | ICD-10-CM

## 2022-02-05 DIAGNOSIS — I480 Paroxysmal atrial fibrillation: Secondary | ICD-10-CM | POA: Diagnosis not present

## 2022-02-05 DIAGNOSIS — Z1159 Encounter for screening for other viral diseases: Secondary | ICD-10-CM

## 2022-02-05 DIAGNOSIS — Z0001 Encounter for general adult medical examination with abnormal findings: Secondary | ICD-10-CM

## 2022-02-05 DIAGNOSIS — M5136 Other intervertebral disc degeneration, lumbar region: Secondary | ICD-10-CM

## 2022-02-05 DIAGNOSIS — R110 Nausea: Secondary | ICD-10-CM

## 2022-02-05 DIAGNOSIS — Z7901 Long term (current) use of anticoagulants: Secondary | ICD-10-CM

## 2022-02-05 DIAGNOSIS — Z2821 Immunization not carried out because of patient refusal: Secondary | ICD-10-CM

## 2022-02-05 MED ORDER — ONDANSETRON HCL 4 MG PO TABS: 4.0000 mg | ORAL_TABLET | Freq: Three times a day (TID) | ORAL | 0 refills | Status: DC | PRN

## 2022-02-05 NOTE — Assessment & Plan Note (Signed)
On Pravastatin Check lipid profile 

## 2022-02-05 NOTE — Assessment & Plan Note (Addendum)
H/o PAF On Xarelto Follows up with Cardiologist

## 2022-02-05 NOTE — Patient Instructions (Signed)
Please continue to take medications as prescribed. ? ?Please continue to follow low salt diet and ambulate as tolerated. ?

## 2022-02-05 NOTE — Progress Notes (Signed)
Established Patient Office Visit  Subjective:  Patient ID: Jonathan Poole, male    DOB: Nov 30, 1955  Age: 66 y.o. MRN: [id number]  CC:  Chief Complaint  Patient presents with   Follow-up   Annual Exam    HPI Kimsey Demaree Mahler is a 66 y.o. male with past medical history of sinus node dysfunction s/p pacemaker placement, PAF, HLD, DDD of lumbar spine and memory problem who presents for annual physical.  His partner is still concerned about his memory.  His MMSE was 22/30 in the last visit, worse compared to prior.  He is taking donepezil currently.  He denies any recent injury or fall.  He does not recall date or the year.  His partner states that he does not go out alone. He still drives, but is partner thinks that he gets lost in the streets at times.  She also reports that he has episodes of agitation.  Denies any episode of delusion or hallucination. His hearing is impaired, and he still does not wear hearing aid.  Paroxysmal A-fib: He takes Xarelto currently for it.  Denies any signs of active bleeding.  He sees [facility] for chronic pain.  He is currently on oxycodone and morphine for chronic pain from DDD of lumbar spine.      Past Medical History:  Diagnosis Date   Atrial fibrillation ([place])    Dementia ([place])    Heart disease    Hyperchloremia     Past Surgical History:  Procedure Laterality Date   PACEMAKER IMPLANT     PPM GENERATOR CHANGEOUT N/A 01/02/2020   Procedure: PPM GENERATOR CHANGEOUT;  Surgeon: Evans Lance, MD;  Location: [facility] CV LAB;  Service: Cardiovascular;  Laterality: N/A;    Family History  Problem Relation Age of Onset   Heart disease Father    Prostate cancer Paternal Grandfather     Social History   Socioeconomic History   Marital status: Married    Spouse name: Not on file   Number of children: 3   Years of education: Not on file   Highest education level: Not on file  Occupational History   Occupation:  retired  Tobacco Use   Smoking status: Former    Packs/day: 1.00    Years: 15.00    Total pack years: 15.00    Types: Cigarettes    Quit date: 2001    Years since quitting: 23.0   Smokeless tobacco: Never  Vaping Use   Vaping Use: Some days  Substance and Sexual Activity   Alcohol use: Never   Drug use: Not Currently    Types: Marijuana   Sexual activity: Not on file  Other Topics Concern   Not on file  Social History Narrative   Not on file   Social Determinants of Health   Financial Resource Strain: Low Risk  (09/05/2021)   Overall Financial Resource Strain (CARDIA)    Difficulty of Paying Living Expenses: Not hard at all  Food Insecurity: No Food Insecurity (09/05/2021)   Hunger Vital Sign    Worried About Running Out of Food in the Last Year: Never true    [place] in the Last Year: Never true  Transportation Needs: No Transportation Needs (09/05/2021)   PRAPARE - Hydrologist (Medical): No    Lack of Transportation (Non-Medical): No  Physical Activity: Sufficiently Active (09/05/2021)   Exercise Vital Sign    Days of Exercise per Week:  7 days    Minutes of Exercise per Session: 50 min  Stress: No Stress Concern Present (09/05/2021)   [place]    Feeling of Stress : Not at all  Recent Concern: Stress - Stress Concern Present (08/29/2021)   [place]    Feeling of Stress : To some extent  Social Connections: Moderately Integrated (09/05/2021)   Social Connection and Isolation Panel [NHANES]    Frequency of Communication with Friends and Family: More than three times a week    Frequency of Social Gatherings with Friends and Family: Three times a week    Attends Religious Services: Never    Active Member of Clubs or Organizations: Yes    Attends Archivist Meetings: Never    Marital Status:  Married  Human resources officer Violence: Not At Risk (09/05/2021)   Humiliation, Afraid, Rape, and Kick questionnaire    Fear of Current or Ex-Partner: No    Emotionally Abused: No    Physically Abused: No    Sexually Abused: No    Outpatient Medications Prior to Visit  Medication Sig Dispense Refill   acetaminophen (TYLENOL) 500 MG tablet Take 1,000 mg by mouth every 6 (six) hours as needed for moderate pain or headache.     donepezil (ARICEPT) 10 MG tablet TAKE 1 TABLET AT BEDTIME 90 tablet 1   gabapentin (NEURONTIN) 600 MG tablet Take 600 mg by mouth 3 (three) times daily.     morphine (MS CONTIN) 30 MG 12 hr tablet Take 30 mg by mouth every 8 (eight) hours.     naloxone (NARCAN) nasal spray 4 mg/0.1 mL SMARTSIG:Both Nares     Oxycodone HCl 10 MG TABS Take 10 mg by mouth 4 (four) times daily as needed.     rivaroxaban (XARELTO) 20 MG TABS tablet Take 1 tablet (20 mg total) by mouth daily with supper. 30 tablet 11   rosuvastatin (CRESTOR) 20 MG tablet TAKE (1) TABLET BY MOUTH ONCE DAILY. (Patient taking differently: Take 20 mg by mouth daily. TAKE (1) TABLET BY MOUTH ONCE DAILY.) 90 tablet 0   tiZANidine (ZANAFLEX) 4 MG tablet Take 1 tablet (4 mg total) by mouth 3 (three) times daily. 30 tablet 1   ondansetron (ZOFRAN) 4 MG tablet Take 1 tablet (4 mg total) by mouth every 8 (eight) hours as needed for nausea or vomiting. 20 tablet 0   doxycycline (VIBRA-TABS) 100 MG tablet Take (2) tablet PO for 1 day, then (1) tablet PO every 12 hrs for 5 days (Patient not taking: Reported on 02/05/2022) 12 tablet 0   gabapentin (NEURONTIN) 300 MG capsule Take 300 mg by mouth 2 (two) times daily. (Patient not taking: Reported on 02/05/2022)     gabapentin (NEURONTIN) 400 MG capsule Take 400 mg by mouth 3 (three) times daily.     oxyCODONE (OXY IR/ROXICODONE) 5 MG immediate release tablet Take 5 mg by mouth every 6 (six) hours as needed for severe pain. (Patient not taking: Reported on 02/05/2022)     No  facility-administered medications prior to visit.    Allergies  Allergen Reactions   Penicillins     Unknown reaction    ROS Review of Systems  Constitutional:  Negative for chills and fever.  HENT:  Positive for hearing loss. Negative for congestion and sore throat.   Eyes:  Negative for pain and discharge.  Respiratory:  Negative for cough and shortness of breath.  Cardiovascular:  Negative for chest pain and palpitations.  Gastrointestinal:  Positive for nausea. Negative for constipation, diarrhea and vomiting.  Endocrine: Negative for polydipsia and polyuria.  Genitourinary:  Negative for dysuria and hematuria.  Musculoskeletal:  Positive for back pain. Negative for neck pain and neck stiffness.  Skin:  Negative for rash.  Neurological:  Negative for dizziness, weakness, numbness and headaches.  Psychiatric/Behavioral:  Positive for agitation, confusion and sleep disturbance. Negative for behavioral problems. The patient is nervous/anxious.       Objective:    Physical Exam Vitals reviewed.  Constitutional:      General: He is not in acute distress.    Appearance: He is not diaphoretic.  HENT:     Head: Normocephalic and atraumatic.     Nose: Nose normal.     Mouth/Throat:     Mouth: Mucous membranes are moist.  Eyes:     General: No scleral icterus.    Extraocular Movements: Extraocular movements intact.  Cardiovascular:     Rate and Rhythm: Normal rate and regular rhythm.     Pulses: Normal pulses.     Heart sounds: Normal heart sounds. No murmur heard. Pulmonary:     Breath sounds: Normal breath sounds. No wheezing or rales.  Abdominal:     Palpations: Abdomen is soft.     Tenderness: There is no abdominal tenderness.  Musculoskeletal:     Cervical back: Neck supple. No tenderness.     Right lower leg: No edema.     Left lower leg: No edema.  Skin:    General: Skin is warm.     Findings: No rash.  Neurological:     General: No focal deficit present.      Mental Status: He is alert and oriented to person, place, and time.     Cranial Nerves: No cranial nerve deficit.     Sensory: No sensory deficit.     Motor: No weakness.  Psychiatric:        Mood and Affect: Mood normal.        Behavior: Behavior normal.     BP 129/83 (BP Location: Right Arm, Patient Position: Sitting, Cuff Size: Large)   Pulse 95   Ht 6' (1.829 m)   Wt 176 lb (79.8 kg)   SpO2 95%   BMI 23.87 kg/m  Wt Readings from Last 3 Encounters:  02/05/22 176 lb (79.8 kg)  01/07/22 174 lb (78.9 kg)  07/31/21 175 lb 6.4 oz (79.6 kg)    Lab Results  Component Value Date   TSH 2.160 07/13/2020   Lab Results  Component Value Date   WBC 5.1 07/13/2020   HGB 15.2 07/13/2020   HCT 45.7 07/13/2020   MCV 86 07/13/2020   PLT 193 07/13/2020   Lab Results  Component Value Date   NA 144 03/21/2021   K 3.9 03/21/2021   CO2 21 03/21/2021   GLUCOSE 88 03/21/2021   BUN 7 (L) 03/21/2021   CREATININE 1.00 03/21/2021   BILITOT 0.8 07/13/2020   ALKPHOS 71 07/13/2020   AST 43 (H) 07/13/2020   ALT 43 07/13/2020   PROT 6.9 07/13/2020   ALBUMIN 4.9 (H) 07/13/2020   CALCIUM 9.1 03/21/2021   ANIONGAP 7 12/12/2019   EGFR 83 03/21/2021   Lab Results  Component Value Date   CHOL 166 07/13/2020   Lab Results  Component Value Date   HDL 48 07/13/2020   Lab Results  Component Value Date   LDLCALC 97 07/13/2020  Lab Results  Component Value Date   TRIG 117 07/13/2020   Lab Results  Component Value Date   CHOLHDL 3.5 07/13/2020   Lab Results  Component Value Date   HGBA1C 5.8 (H) 07/13/2020      Assessment & Plan:   Problem List Items Addressed This Visit       Cardiovascular and Mediastinum   PAF (paroxysmal atrial fibrillation) ([place])    H/o PAF On Xarelto Follows up with Cardiologist      Relevant Orders   AMB Referral to Chronic Care Management Services     Nervous and Auditory   Alzheimer's dementia with behavioral disturbance ([place])     Early cognitive decline with concerns of anxiety and angry behavior at times MMSE: 22/30 Had started Donepezil after discussion in the last visit, increased dose to 10 mg daily He needs hearing aide to help with hearing, as untreated hearing loss can worsen dementia Referred to CCM to help with local behavioral therapy and support groups for dementia      Relevant Medications   gabapentin (NEURONTIN) 600 MG tablet   Other Relevant Orders   AMB Referral to Chronic Care Management Services   CMP14+EGFR     Musculoskeletal and Integument   DDD (degenerative disc disease), lumbar    Chronic low back pain Has been on Oxycodone 10 mg QID and morphine Followed by pain clinic for pain management      Relevant Medications   morphine (MS CONTIN) 30 MG 12 hr tablet     Other   HLD (hyperlipidemia)    On Pravastatin Check lipid profile      Relevant Orders   AMB Referral to Chronic Care Management Services   Lipid Profile   Encounter for general adult medical examination with abnormal findings - Primary    Physical exam as documented. Fasting blood tests today. Refuses colonoscopy. Refuses flu, pneumococcal, Shingrix and Tdap vaccines.      Other Visit Diagnoses     Nausea       Relevant Medications   ondansetron (ZOFRAN) 4 MG tablet   Refused influenza vaccine       Refused pneumococcal vaccination       Chronic anticoagulation       Relevant Orders   CBC with Differential/Platelet   Need for hepatitis C screening test       Relevant Orders   Hepatitis C Antibody       Meds ordered this encounter  Medications   ondansetron (ZOFRAN) 4 MG tablet    Sig: Take 1 tablet (4 mg total) by mouth every 8 (eight) hours as needed for nausea or vomiting.    Dispense:  20 tablet    Refill:  0    Follow-up: Return in about 6 months (around 08/07/2022) for Dementia and HLD.    Lindell Spar, MD

## 2022-02-05 NOTE — Assessment & Plan Note (Signed)
Physical exam as documented. Fasting blood tests today. Refuses colonoscopy. Refuses flu, pneumococcal, Shingrix and Tdap vaccines.

## 2022-02-05 NOTE — Assessment & Plan Note (Addendum)
Early cognitive decline with concerns of anxiety and angry behavior at times MMSE: 22/30 Had started Donepezil after discussion in the last visit, increased dose to 10 mg daily He needs hearing aide to help with hearing, as untreated hearing loss can worsen dementia Referred to CCM to help with local behavioral therapy and support groups for dementia

## 2022-02-05 NOTE — Assessment & Plan Note (Addendum)
Chronic low back pain Has been on Oxycodone 10 mg QID and morphine Followed by pain clinic for pain management

## 2022-02-06 LAB — CMP14+EGFR
ALT: 26 IU/L (ref 0–44)
AST: 29 IU/L (ref 0–40)
Albumin/Globulin Ratio: 2 (ref 1.2–2.2)
Albumin: 4.3 g/dL (ref 3.9–4.9)
Alkaline Phosphatase: 69 IU/L (ref 44–121)
BUN/Creatinine Ratio: 7 — ABNORMAL LOW (ref 10–24)
BUN: 7 mg/dL — ABNORMAL LOW (ref 8–27)
Bilirubin Total: 0.7 mg/dL (ref 0.0–1.2)
CO2: 24 mmol/L (ref 20–29)
Calcium: 9.2 mg/dL (ref 8.6–10.2)
Chloride: 106 mmol/L (ref 96–106)
Creatinine, Ser: 1 mg/dL (ref 0.76–1.27)
Globulin, Total: 2.1 g/dL (ref 1.5–4.5)
Glucose: 112 mg/dL — ABNORMAL HIGH (ref 70–99)
Potassium: 4.2 mmol/L (ref 3.5–5.2)
Sodium: 145 mmol/L — ABNORMAL HIGH (ref 134–144)
Total Protein: 6.4 g/dL (ref 6.0–8.5)
eGFR: 82 mL/min/{1.73_m2} (ref >59)

## 2022-02-06 LAB — CBC WITH DIFFERENTIAL/PLATELET
Basophils Absolute: 0 10*3/uL (ref 0.0–0.2)
Basos: 0 %
EOS (ABSOLUTE): 0.1 10*3/uL (ref 0.0–0.4)
Eos: 1 %
Hematocrit: 43.7 % (ref 37.5–51.0)
Hemoglobin: 14.9 g/dL (ref 13.0–17.7)
Immature Grans (Abs): 0 10*3/uL (ref 0.0–0.1)
Immature Granulocytes: 0 %
Lymphocytes Absolute: 1.2 10*3/uL (ref 0.7–3.1)
Lymphs: 24 %
MCH: 29.6 pg (ref 26.6–33.0)
MCHC: 34.1 g/dL (ref 31.5–35.7)
MCV: 87 fL (ref 79–97)
Monocytes Absolute: 0.4 10*3/uL (ref 0.1–0.9)
Monocytes: 7 %
Neutrophils Absolute: 3.3 10*3/uL (ref 1.4–7.0)
Neutrophils: 68 %
Platelets: 158 10*3/uL (ref 150–450)
RBC: 5.03 x10E6/uL (ref 4.14–5.80)
RDW: 12.5 % (ref 11.6–15.4)
WBC: 4.9 10*3/uL (ref 3.4–10.8)

## 2022-02-06 LAB — LIPID PANEL
Chol/HDL Ratio: 3.1 ratio (ref 0.0–5.0)
Cholesterol, Total: 145 mg/dL (ref 100–199)
HDL: 47 mg/dL (ref >39)
LDL Chol Calc (NIH): 75 mg/dL (ref 0–99)
Triglycerides: 128 mg/dL (ref 0–149)
VLDL Cholesterol Cal: 23 mg/dL (ref 5–40)

## 2022-02-06 LAB — HEPATITIS C ANTIBODY: Hep C Virus Ab: NONREACTIVE

## 2022-02-26 ENCOUNTER — Other Ambulatory Visit: Payer: Self-pay | Admitting: Internal Medicine

## 2022-02-26 ENCOUNTER — Telehealth: Payer: Self-pay

## 2022-02-26 DIAGNOSIS — E785 Hyperlipidemia, unspecified: Secondary | ICD-10-CM

## 2022-02-26 NOTE — Progress Notes (Signed)
  Chronic Care Management   Note  02/26/2022 Name: FARLEY CROOKER MRN: [id number] DOB: Feb 03, 1956  Nanine Means Malecki is a 67 y.o. year old male who is a primary care patient of Lindell Spar, MD. I reached out to [place] by phone today in response to a referral sent by Mr. Azar [place]'s PCP.  The first contact attempt was unsuccessful.   Follow up plan: Additional outreach attempts will be made.  Noreene Larsson, [place], [place] [address] Direct Dial: [phone] [email or web address]

## 2022-03-04 NOTE — Progress Notes (Signed)
  Chronic Care Management   Note  03/04/2022 Name: Jonathan Poole MRN: [id number] DOB: 08/16/1955  Jonathan Poole is a 67 y.o. year old male who is a primary care patient of Jonathan Spar, MD. I reached out to Jonathan Poole by phone today in response to a referral sent by [name]'s PCP.  The second contact attempt was unsuccessful.   Follow up plan: Additional outreach attempts will be made.  Jonathan Poole, [place], [place] [address] Direct Dial: [phone] [email or web address]

## 2022-03-05 ENCOUNTER — Telehealth: Payer: Self-pay

## 2022-03-05 NOTE — Patient Outreach (Signed)
  Care Coordination   Consult  Visit Note   03/05/2022 - Late entry from 03/04/22 Name: Jonathan Poole MRN: [id number] DOB: May 19, 1955  Jonathan Poole is a 67 y.o. year old male who sees Lindell Spar, MD for primary care. I  consulted with Janalyn Shy assistance clinical director for CCM program who requested resource information for dementia resources related to caregiver support. Resources provided to assist patient with dementia resource needs.   SDOH assessments and interventions completed:  No     Care Coordination Interventions:  Yes, provided   Follow up plan: No further intervention required.   Encounter Outcome:  Pt. Visit Completed   Daneen Schick, BSW, CDP Social Worker, Certified Dementia Practitioner [facility]  Care Coordination [phone]

## 2022-03-07 NOTE — Progress Notes (Signed)
  Chronic Care Management   Note  03/07/2022 Name: Jonathan Poole MRN: [id number] DOB: 09/03/1955  Jonathan Poole is a 67 y.o. year old male who is a primary care patient of Lindell Spar, MD. I reached out to [place] by phone today in response to a referral sent by [name]'s PCP.  The third contact attempt was unsuccessful.   Follow up plan: Unable to reach patient after 3 attempts. No further outreach attempts will be made pending additional provider engagement, patient request, or new provider order.   Noreene Larsson, [place], [place] [address] Direct Dial: [phone] [email or web address]

## 2022-03-14 ENCOUNTER — Telehealth: Payer: Medicare HMO

## 2022-03-14 NOTE — Progress Notes (Signed)
  Chronic Care Management   Note  03/14/2022 Name: Jonathan Poole MRN: [id number] DOB: Jun 18, 1955  Jonathan Poole is a 66 y.o. year old male who is a primary care patient of Lindell Spar, MD. I reached out to [place] by phone today in response to a referral sent by Jonathan Poole.  Jonathan Poole was given information about Chronic Care Management services today including:  CCM service includes personalized support from designated clinical staff supervised by the physician, including individualized plan of care and coordination with other care providers 24/7 contact phone numbers for assistance for urgent and routine care needs. Service will only be billed when office clinical staff spend 20 minutes or more in a month to coordinate care. Only one practitioner may furnish and bill the service in a calendar month. The patient may stop CCM services at amy time (effective at the end of the month) by phone call to the office staff. The patient will be responsible for cost sharing (co-pay) or up to 20% of the service fee (after annual deductible is met)  Jonathan Poole  agreedto scheduling an appointment with the CCM RN Case Manager   Follow up plan: Patient agreed to scheduled appointment with RN Case Manager on 03/17/2022(date/time).   Noreene Larsson, [place], [place] [address] Direct Dial: [phone] [email or web address]

## 2022-03-17 ENCOUNTER — Ambulatory Visit (INDEPENDENT_AMBULATORY_CARE_PROVIDER_SITE_OTHER): Payer: Medicare HMO | Admitting: *Deleted

## 2022-03-17 DIAGNOSIS — E782 Mixed hyperlipidemia: Secondary | ICD-10-CM

## 2022-03-17 DIAGNOSIS — I480 Paroxysmal atrial fibrillation: Secondary | ICD-10-CM

## 2022-03-17 DIAGNOSIS — F02818 Dementia in other diseases classified elsewhere, unspecified severity, with other behavioral disturbance: Secondary | ICD-10-CM

## 2022-03-17 NOTE — Patient Instructions (Addendum)
Please call the care guide team at [phone] if you need to cancel or reschedule your appointment.   If you are experiencing a Mental Health or [place] or need someone to talk to, please call the Suicide and Crisis Lifeline: 988 call the [place] National Suicide Prevention Lifeline: 765-275-3469 or TTY: 213-808-8709 TTY 780 652 6917) to talk to a trained counselor call 1-800-273-TALK (toll free, 24 hour hotline) go to [facility] [address] [place] [phone]) call the [facility]: [phone] call 911   Following is a copy of the CCM Program Consent:  CCM service includes personalized support from designated clinical staff supervised by the physician, including individualized plan of care and coordination with other care providers 24/7 contact phone numbers for assistance for urgent and routine care needs. Service will only be billed when office clinical staff spend 20 minutes or more in a month to coordinate care. Only one practitioner may furnish and bill the service in a calendar month. The patient may stop CCM services at amy time (effective at the end of the month) by phone call to the office staff. The patient will be responsible for cost sharing (co-pay) or up to 20% of the service fee (after annual deductible is met)  Following is a copy of your full provider care plan:   Goals Addressed             This Visit's Progress    CCM (ATRIAL FIBRILLATION) EXPECTED OUTCOME: MONITOR, SELF-MANAGE AND REDUCE SYMPTOMS OF ATRIAL FIBRILLATION       Current Barriers:  Knowledge Deficits related to Atrial Fibrillation management Chronic Disease Management support and education needs related to Atrial Fibrillation Cognitive Deficits No Advanced Directives in place-  pt requests documents  Planned Interventions: Counseled on increased risk of stroke due to Afib and benefits of anticoagulation for stroke  prevention           Reviewed importance of adherence to anticoagulant exactly as prescribed Counseled on importance of regular laboratory monitoring as prescribed Afib action plan reviewed Screening for signs and symptoms of depression related to chronic disease state Assessed social determinant of health barriers Advanced directives packet mailed Education sent via my chart- Atrial Fibrillation  Symptom Management: Take medications as prescribed   Attend all scheduled provider appointments Call pharmacy for medication refills 3-7 days in advance of running out of medications Call provider office for new concerns or questions  - begin a symptom diary - bring symptom diary to all appointments - check pulse (heart) rate once a day - make a plan to exercise regularly - make a plan to eat healthy - keep all lab appointments - take medicine as prescribed - wear medical alert identification Look over education sent via my chart- Atrial Fibrillation Advanced directives packed mailed  Follow Up Plan: Telephone follow up appointment with care management team member scheduled for:  05/14/22 at 215 pm       CCM (DEMENTIA) EXPECTED OUTCOME: MONITOR, SELF-MANAGE AND REDUCE SYMPTOMS OF DEMENTIA       Current Barriers:  Knowledge Deficits related to Alzheimer's dementia management, resources Care Coordination needs related to caregiver, dementia resources in a patient with Alzheimer's dementia Chronic Disease Management support and education needs related to Alzheimer's dementia Cognitive Deficits No Advanced Directives in place- documents to be mailed Spoke with patient and spouse who report pt lives with spouse and adult son, pt states he is independent with ADL's and continues to drive but not alone,  spouse states pt is forgetful most of the time, pt oriented x 3 today during assessment, pt recognizes family members, has agitation at times.  Spouse states she is not interested in caregiver  support that involves a group.  Planned Interventions: Collaborated with Social worker Daneen Schick, Reviewed medications including importance of taking as prescribed, Depression screen completed, Emotional Support Provided to patient/caregiver, [place] , Discussed importance of discussing diagnosis with provider, Discussed importance of attendance to all provider appointments, and Advised to contact provider for new or worsening symptoms Reinforced tips for everyday care of patients with dementia: keep a routine, such as bathing dressing eating at the same time each day, plan activities that the person enjoys and try to do them at the same time each day, serve meals in a consistent, familiar place and give patient enough time to eat, encourage use of loose-fitting, comfortable, easy to use clothing such as, elastic waistbands, fabric fasteners, or large zipper pulls instead of shoelaces, buttons, or buckles. RN care manager attempted to call patient/ spouse back today to provide resources after collaborating with social worker and unable to reach, will attempt again this week. In basket message sent to primary care provider asking if pt is supposed to take Vitamin D due to level was low on 07/13/20 or have level rechecked, ask if pt had CT scan ordered February 2023 (does not appear pt had the scan) and ask if needs to be reordered.  Symptom Management: Take medications as prescribed   Attend all scheduled provider appointments Call pharmacy for medication refills 3-7 days in advance of running out of medications Call provider office for new concerns or questions  Tips for everyday care of patients with dementia: keep a routine, such as bathing dressing eating at the same time each day, plan activities that the person enjoys and try to do them at the same time each day, serve meals in a consistent, familiar place and give patient enough time to eat, encourage use of  loose-fitting, comfortable, easy to use clothing such as, elastic waistbands, fabric fasteners, or large zipper pulls instead of shoelaces, buttons, or buckles. RN care manager will contact you this week with a list of resources for management of dementia and resources for caregiver  Follow Up Plan: Telephone follow up appointment with care management team member scheduled for: 05/14/22 at 215 pm       CCM (HYPERLIPIDEMIA) EXPECTED OUTCOME: MONITOR, SELF-MANAGE AND REDUCE SYMPTOMS OF HYPERLIPIDEMIA       Current Barriers:  Knowledge Deficits related to Hyperlipidemia management Chronic Disease Management support and education needs related to Hyperlipidemia, diet Cognitive Deficits No Advanced Directives in place- pt requests documents Patient reports he does not do a lot of exercise  Planned Interventions: Provider established cholesterol goals reviewed; Counseled on importance of regular laboratory monitoring as prescribed; Provided HLD educational materials; Reviewed importance of limiting foods high in cholesterol; Reviewed exercise goals and target of 150 minutes per week; Screening for signs and symptoms of depression related to chronic disease state;  Assessed social determinant of health barriers;   Symptom Management: Take medications as prescribed   Attend all scheduled provider appointments Call pharmacy for medication refills 3-7 days in advance of running out of medications Attend church or other social activities Call provider office for new concerns or questions  - call doctor with any symptoms you believe are related to your medicine - call doctor when you experience any new symptoms - go to all  doctor appointments as scheduled - adhere to prescribed diet: heart healthy - develop an exercise routine Look over education sent via my chart- heart healthy diet  Follow Up Plan: Telephone follow up appointment with care management team member scheduled for:  05/14/22 at 215  pm          Patient verbalizes understanding of instructions and care plan provided today and agrees to view in MyChart. Active MyChart status and patient understanding of how to access instructions and care plan via MyChart confirmed with patient.     Telephone follow up appointment with care management team member scheduled for:  05/14/22 at 215 pm  Heart-Healthy Eating Plan Eating a healthy diet is important for the health of your heart. A heart-healthy eating plan includes: Eating less unhealthy fats. Eating more healthy fats. Eating less salt in your food. Salt is also called sodium. Making other changes in your diet. Talk with your doctor or a diet specialist (dietitian) to create an eating plan that is right for you. What is my plan? Your doctor may recommend an eating plan that includes: Total fat: ______% or less of total calories a day. Saturated fat: ______% or less of total calories a day. Cholesterol: less than _________mg a day. Sodium: less than _________mg a day. What are tips for following this plan? Cooking Avoid frying your food. Try to bake, boil, grill, or broil it instead. You can also reduce fat by: Removing the skin from poultry. Removing all visible fats from meats. Steaming vegetables in water or broth. Meal planning  At meals, divide your plate into four equal parts: Fill one-half of your plate with vegetables and green salads. Fill one-fourth of your plate with whole grains. Fill one-fourth of your plate with lean protein foods. Eat 2-4 cups of vegetables per day. One cup of vegetables is: 1 cup (91 g) broccoli or cauliflower florets. 2 medium carrots. 1 large bell pepper. 1 large sweet potato. 1 large tomato. 1 medium white potato. 2 cups (150 g) raw leafy greens. Eat 1-2 cups of fruit per day. One cup of fruit is: 1 small apple 1 large banana 1 cup (237 g) mixed fruit, 1 large orange,  cup (82 g) dried fruit, 1 cup (240 mL) 100% fruit  juice. Eat more foods that have soluble fiber. These are apples, broccoli, carrots, beans, peas, and barley. Try to get 20-30 g of fiber per day. Eat 4-5 servings of nuts, legumes, and seeds per week: 1 serving of dried beans or legumes equals  cup (90 g) cooked. 1 serving of nuts is  oz (12 almonds, 24 pistachios, or 7 walnut halves). 1 serving of seeds equals  oz (8 g). General information Eat more home-cooked food. Eat less restaurant, buffet, and fast food. Limit or avoid alcohol. Limit foods that are high in starch and sugar. Avoid fried foods. Lose weight if you are overweight. Keep track of how much salt (sodium) you eat. This is important if you have high blood pressure. Ask your doctor to tell you more about this. Try to add vegetarian meals each week. Fats Choose healthy fats. These include olive oil and canola oil, flaxseeds, walnuts, almonds, and seeds. Eat more omega-3 fats. These include salmon, mackerel, sardines, tuna, flaxseed oil, and ground flaxseeds. Try to eat fish at least 2 times each week. Check food labels. Avoid foods with trans fats or high amounts of saturated fat. Limit saturated fats. These are often found in animal products, such as meats, butter,  and cream. These are also found in plant foods, such as palm oil, palm kernel oil, and coconut oil. Avoid foods with partially hydrogenated oils in them. These have trans fats. Examples are stick margarine, some tub margarines, cookies, crackers, and other baked goods. What foods should I eat? Fruits All fresh, canned (in natural juice), or frozen fruits. Vegetables Fresh or frozen vegetables (raw, steamed, roasted, or grilled). Green salads. Grains Most grains. Choose whole wheat and whole grains most of the time. Rice and pasta, including brown rice and pastas made with whole wheat. Meats and other proteins Lean, well-trimmed beef, veal, pork, and lamb. Chicken and [place] without skin. All fish and  shellfish. Wild duck, rabbit, pheasant, and venison. Egg whites or low-cholesterol egg substitutes. Dried beans, peas, lentils, and tofu. Seeds and most nuts. Dairy Low-fat or nonfat cheeses, including ricotta and mozzarella. Skim or 1% milk that is liquid, powdered, or evaporated. Buttermilk that is made with low-fat milk. Nonfat or low-fat yogurt. Fats and oils Non-hydrogenated (trans-free) margarines. Vegetable oils, including soybean, sesame, sunflower, olive, peanut, safflower, corn, canola, and cottonseed. Salad dressings or mayonnaise made with a vegetable oil. Beverages Mineral water. Coffee and tea. Diet carbonated beverages. Sweets and desserts Sherbet, gelatin, and fruit ice. Small amounts of dark chocolate. Limit all sweets and desserts. Seasonings and condiments All seasonings and condiments. The items listed above may not be a complete list of foods and drinks you can eat. Contact a dietitian for more options. What foods should I avoid? Fruits Canned fruit in heavy syrup. Fruit in cream or butter sauce. Fried fruit. Limit coconut. Vegetables Vegetables cooked in cheese, cream, or butter sauce. Fried vegetables. Grains Breads that are made with saturated or trans fats, oils, or whole milk. Croissants. Sweet rolls. Donuts. High-fat crackers, such as cheese crackers. Meats and other proteins Fatty meats, such as hot dogs, ribs, sausage, bacon, rib-eye roast or steak. High-fat deli meats, such as salami and bologna. Caviar. Domestic duck and goose. Organ meats, such as liver. Dairy Cream, sour cream, cream cheese, and creamed cottage cheese. Whole-milk cheeses. Whole or 2% milk that is liquid, evaporated, or condensed. Whole buttermilk. Cream sauce or high-fat cheese sauce. Yogurt that is made from whole milk. Fats and oils Meat fat, or shortening. Cocoa butter, hydrogenated oils, palm oil, coconut oil, palm kernel oil. Solid fats and shortenings, including bacon fat, salt pork,  lard, and butter. Nondairy cream substitutes. Salad dressings with cheese or sour cream. Beverages Regular sodas and juice drinks with added sugar. Sweets and desserts Frosting. Pudding. Cookies. Cakes. Pies. Milk chocolate or white chocolate. Buttered syrups. Full-fat ice cream or ice cream drinks. The items listed above may not be a complete list of foods and drinks to avoid. Contact a dietitian for more information. Summary Heart-healthy meal planning includes eating less unhealthy fats, eating more healthy fats, and making other changes in your diet. Eat a balanced diet. This includes fruits and vegetables, low-fat or nonfat dairy, lean protein, nuts and legumes, whole grains, and heart-healthy oils and fats. This information is not intended to replace advice given to you by your health care provider. Make sure you discuss any questions you have with your health care provider. Document Revised: 03/04/2021 Document Reviewed: 03/04/2021 Elsevier Patient Education  2023 [employer]. Atrial Fibrillation  Atrial fibrillation is a type of heartbeat that is irregular or fast. If you have this condition, your heart beats without any order. This makes it hard for your heart to pump blood in  a normal way. Atrial fibrillation may come and go, or it may become a long-lasting problem. If this condition is not treated, it can put you at higher risk for stroke, heart failure, and other heart problems. What are the causes? This condition may be caused by diseases that damage the heart. They include: High blood pressure. Heart failure. Heart valve disease. Heart surgery. Other causes include: Diabetes. Thyroid disease. Being overweight. Kidney disease. Sometimes the cause is not known. What increases the risk? You are more likely to develop this condition if: You are older. You smoke. You exercise often and very hard. You have a family history of this condition. You are a man. You use  drugs. You drink a lot of alcohol. You have lung conditions, such as emphysema, pneumonia, or COPD. You have sleep apnea. What are the signs or symptoms? Common symptoms of this condition include: A feeling that your heart is beating very fast. Chest pain or discomfort. Feeling short of breath. Suddenly feeling light-headed or weak. Getting tired easily during activity. Fainting. Sweating. In some cases, there are no symptoms. How is this treated? Treatment for this condition depends on underlying conditions and how you feel when you have atrial fibrillation. They include: Medicines to: Prevent blood clots. Treat heart rate or heart rhythm problems. Using devices, such as a pacemaker, to correct heart rhythm problems. Doing surgery to remove the part of the heart that sends bad signals. Closing an area where clots can form in the heart (left atrial appendage). In some cases, your doctor will treat other underlying conditions. Follow these instructions at home: Medicines Take over-the-counter and prescription medicines only as told by your doctor. Do not take any new medicines without first talking to your doctor. If you are taking blood thinners: Talk with your doctor before you take any medicines that have aspirin or NSAIDs, such as ibuprofen, in them. Take your medicine exactly as told by your doctor. Take it at the same time each day. Avoid activities that could hurt or bruise you. Follow instructions about how to prevent falls. Wear a bracelet that says you are taking blood thinners. Or, carry a card that lists what medicines you take. Lifestyle     Do not use any products that have nicotine or tobacco in them. These include cigarettes, e-cigarettes, and chewing tobacco. If you need help quitting, ask your doctor. Eat heart-healthy foods. Talk with your doctor about the right eating plan for you. Exercise regularly as told by your doctor. Do not drink alcohol. Lose weight  if you are overweight. Do not use drugs, including cannabis. General instructions If you have a condition that causes breathing to stop for a short period of time (apnea), treat it as told by your doctor. Keep a healthy weight. Do not use diet pills unless your doctor says they are safe for you. Diet pills may make heart problems worse. Keep all follow-up visits as told by your doctor. This is important. Contact a doctor if: You notice a change in the speed, rhythm, or strength of your heartbeat. You are taking a blood-thinning medicine and you get more bruising. You get tired more easily when you move or exercise. You have a sudden change in weight. Get help right away if:  You have pain in your chest or your belly (abdomen). You have trouble breathing. You have side effects of blood thinners, such as blood in your vomit, poop (stool), or pee (urine), or bleeding that cannot stop. You have any  signs of a stroke. "BE FAST" is an easy way to remember the main warning signs: B - Balance. Signs are dizziness, sudden trouble walking, or loss of balance. E - Eyes. Signs are trouble seeing or a change in how you see. F - Face. Signs are sudden weakness or loss of feeling in the face, or the face or eyelid drooping on one side. A - Arms. Signs are weakness or loss of feeling in an arm. This happens suddenly and usually on one side of the body. S - Speech. Signs are sudden trouble speaking, slurred speech, or trouble understanding what people say. T - Time. Time to call emergency services. Write down what time symptoms started. You have other signs of a stroke, such as: A sudden, very bad headache with no known cause. Feeling like you may vomit (nausea). Vomiting. A seizure. These symptoms may be an emergency. Do not wait to see if the symptoms will go away. Get medical help right away. Call your local emergency services (911 in the [place] Do not drive yourself to the hospital. Summary Atrial  fibrillation is a type of heartbeat that is irregular or fast. You are at higher risk of this condition if you smoke, are older, have diabetes, or are overweight. Follow your doctor's instructions about medicines, diet, exercise, and follow-up visits. Get help right away if you have signs or symptoms of a stroke. Get help right away if you cannot catch your breath, or you have chest pain or discomfort. This information is not intended to replace advice given to you by your health care provider. Make sure you discuss any questions you have with your health care provider. Document Revised: 07/21/2018 Document Reviewed: 07/21/2018 Elsevier Patient Education  2023 [employer]. Dementia Caregiver Guide Dementia is a term used to describe a number of symptoms that affect memory and thinking. The most common symptoms include: Memory loss. Trouble with language and communication. Trouble concentrating. Poor judgment and problems with reasoning. Wandering from home or public places. Extreme anxiety or depression. Being suspicious or having angry outbursts and accusations. Child-like behavior and language. Dementia can be frightening and confusing. And taking care of someone with dementia can be challenging. This guide provides tips to help you when providing care for a person with dementia. How to help manage lifestyle changes Dementia usually gets worse slowly over time. In the early stages, people with dementia can stay independent and safe with some help. In later stages, they need help with daily tasks such as dressing, grooming, and using the bathroom. There are actions you can take to help a person manage his or her life while living with this condition. Communicating When the person is talking or seems frustrated, make eye contact and hold the person's hand. Ask specific questions that need yes or no answers. Use simple words, short sentences, and a calm voice. Only give one direction at a  time. When offering choices, limit the person to just one or two. Avoid correcting the person in a negative way. If the person is struggling to find the right words, gently try to help him or her. Preventing injury  Keep floors clear of clutter. Remove rugs, magazine racks, and floor lamps. Keep hallways well lit, especially at night. Put a handrail and nonslip mat in the bathtub or shower. Put childproof locks on cabinets that contain dangerous items, such as medicines, alcohol, guns, toxic cleaning items, sharp tools or utensils, matches, and lighters. For doors to the outside of the  house, put the locks in places where the person cannot see or reach them easily. This will help ensure that the person does not wander out of the house and get lost. Be prepared for emergencies. Keep a list of emergency phone numbers and addresses in a convenient area. Remove car keys and lock garage doors so that the person does not try to get in the car and drive. Have the person wear a bracelet that tracks locations and identifies the person as having memory problems. This should be worn at all times for safety. Helping with daily life  Keep the person on track with his or her routine. Try to identify areas where the person may need help. Be supportive, patient, calm, and encouraging. Gently remind the person that adjusting to changes takes time. Help with the tasks that the person has asked for help with. Keep the person involved in daily tasks and decisions as much as possible. Encourage conversation, but try not to get frustrated if the person struggles to find words or does not seem to appreciate your help. How to recognize stress Look for signs of stress in yourself and in the person you are caring for. If you notice signs of stress, take steps to manage it. Symptoms of stress include: Feeling anxious, irritable, frustrated, or angry. Denying that the person has dementia or that his or her symptoms  will not improve. Feeling depressed, hopeless, or unappreciated. Difficulty sleeping. Difficulty concentrating. Developing stress-related health problems. Feeling like you have too little time for your own life. Follow these instructions at home: Take care of your health Make sure that you and the person you are caring for: Get regular sleep. Exercise regularly. Eat regular, nutritious meals. Take over-the-counter and prescription medicines only as told by your health care providers. Drink enough fluid to keep your urine pale yellow. Attend all scheduled health care appointments.  General instructions Join a support group with others who are caregivers. Ask about respite care resources. Respite care can provide short-term care for the person so that you can have a regular break from the stress of caregiving. Consider any safety risks and take steps to avoid them. Organize medicines in a pill box for each day of the week. Create a plan to handle any legal or financial matters. Get legal or financial advice if needed. Keep a calendar in a central location to remind the person of appointments or other activities. Where to find support: Many individuals and organizations offer support. These include: Support groups for people with dementia. Support groups for caregivers. Counselors or therapists. Home health care services. Adult day care centers. Where to find more information Centers for Disease Control and Prevention: FootballExhibition.com.br Alzheimer's Association: LimitLaws.hu Family Caregiver Alliance: www.caregiver.org Alzheimer's Foundation of [place]: www.alzfdn.org Contact a health care provider if: The person's health is rapidly getting worse. You are no longer able to care for the person. Caring for the person is affecting your physical and emotional health. You are feeling depressed or anxious about caring for the person. Get help right away if: The person threatens himself or  herself, you, or anyone else. You feel depressed or sad, or feel that you want to harm yourself. If you ever feel like your loved one may hurt himself or herself or others, or if he or she shares thoughts about taking his or her own life, get help right away. You can go to your nearest emergency department or: Call your local emergency services (911 in the [place] Call  a suicide crisis helpline, such as the National Suicide Prevention Lifeline at 731-606-5135 or 988 in the [place] This is open 24 hours a day in the [place] Text the Crisis Text Line at 304-095-9865 (in the [place] Summary Dementia is a term used to describe a number of symptoms that affect memory and thinking. Dementia usually gets worse slowly over time. Take steps to reduce the person's risk of injury and to plan for future care. Caregivers need support, relief from caregiving, and time for their own lives. This information is not intended to replace advice given to you by your health care provider. Make sure you discuss any questions you have with your health care provider. Document Revised: 08/22/2020 Document Reviewed: 06/13/2019 Elsevier Patient Education  2023 [employer].

## 2022-03-17 NOTE — Chronic Care Management (AMB) (Signed)
Chronic Care Management   CCM RN Visit Note  03/17/2022 Name: Jonathan Poole MRN: [id number] DOB: [birth year]  Subjective: Jonathan Poole is a 67 y.o. year old male who is a primary care patient of Jonathan Spar, MD. The patient was referred to the Chronic Care Management team for assistance with care management needs subsequent to provider initiation of CCM services and plan of care.    Today's Visit:  Engaged with patient by telephone for initial visit.     SDOH Interventions Today    Flowsheet Row Most Recent Value  SDOH Interventions   Food Insecurity Interventions Intervention Not Indicated  Housing Interventions Intervention Not Indicated  Transportation Interventions Intervention Not Indicated  Utilities Interventions Intervention Not Indicated  Financial Strain Interventions Intervention Not Indicated  Physical Activity Interventions Patient Refused  Stress Interventions Intervention Not Indicated  Social Connections Interventions Patient Refused         Goals Addressed             This Visit's Progress    CCM (ATRIAL FIBRILLATION) EXPECTED OUTCOME: MONITOR, SELF-MANAGE AND REDUCE SYMPTOMS OF ATRIAL FIBRILLATION       Current Barriers:  Knowledge Deficits related to Atrial Fibrillation management Chronic Disease Management support and education needs related to Atrial Fibrillation Cognitive Deficits No Advanced Directives in place-  pt requests documents  Planned Interventions: Counseled on increased risk of stroke due to Afib and benefits of anticoagulation for stroke prevention           Reviewed importance of adherence to anticoagulant exactly as prescribed Counseled on importance of regular laboratory monitoring as prescribed Afib action plan reviewed Screening for signs and symptoms of depression related to chronic disease state Assessed social determinant of health barriers Advanced directives packet mailed Education sent via my chart- Atrial  Fibrillation  Symptom Management: Take medications as prescribed   Attend all scheduled provider appointments Call pharmacy for medication refills 3-7 days in advance of running out of medications Call provider office for new concerns or questions  - begin a symptom diary - bring symptom diary to all appointments - check pulse (heart) rate once a day - make a plan to exercise regularly - make a plan to eat healthy - keep all lab appointments - take medicine as prescribed - wear medical alert identification Look over education sent via my chart- Atrial Fibrillation Advanced directives packed mailed  Follow Up Plan: Telephone follow up appointment with care management team member scheduled for:  05/14/22 at 215 pm       CCM (DEMENTIA) EXPECTED OUTCOME: MONITOR, SELF-MANAGE AND REDUCE SYMPTOMS OF DEMENTIA       Current Barriers:  Knowledge Deficits related to Alzheimer's dementia management, resources Care Coordination needs related to caregiver, dementia resources in a patient with Alzheimer's dementia Chronic Disease Management support and education needs related to Alzheimer's dementia Cognitive Deficits No Advanced Directives in place- documents to be mailed Spoke with patient and spouse who report pt lives with spouse and adult son, pt states he is independent with ADL's and continues to drive but not alone, spouse states pt is forgetful most of the time, pt oriented x 3 today during assessment, pt recognizes family members, has agitation at times.  Spouse states she is not interested in caregiver support that involves a group.  Planned Interventions: Collaborated with Social worker Daneen Schick, Reviewed medications including importance of taking as prescribed, Depression screen completed, Emotional Support Provided to patient/caregiver, [place] , Discussed  importance of discussing diagnosis with provider, Discussed importance of attendance to all  provider appointments, and Advised to contact provider for new or worsening symptoms Reinforced tips for everyday care of patients with dementia: keep a routine, such as bathing dressing eating at the same time each day, plan activities that the person enjoys and try to do them at the same time each day, serve meals in a consistent, familiar place and give patient enough time to eat, encourage use of loose-fitting, comfortable, easy to use clothing such as, elastic waistbands, fabric fasteners, or large zipper pulls instead of shoelaces, buttons, or buckles. RN care manager attempted to call patient/ spouse back today to provide resources after collaborating with social worker and unable to reach, will attempt again this week. In basket message sent to primary care provider asking if pt is supposed to take Vitamin D due to level was low on 07/13/20 or have level rechecked, ask if pt had CT scan ordered February 2023 (does not appear pt had the scan) and ask if needs to be reordered.  Symptom Management: Take medications as prescribed   Attend all scheduled provider appointments Call pharmacy for medication refills 3-7 days in advance of running out of medications Call provider office for new concerns or questions  Tips for everyday care of patients with dementia: keep a routine, such as bathing dressing eating at the same time each day, plan activities that the person enjoys and try to do them at the same time each day, serve meals in a consistent, familiar place and give patient enough time to eat, encourage use of loose-fitting, comfortable, easy to use clothing such as, elastic waistbands, fabric fasteners, or large zipper pulls instead of shoelaces, buttons, or buckles. RN care manager will contact you this week with a list of resources for management of dementia and resources for caregiver  Follow Up Plan: Telephone follow up appointment with care management team member scheduled for: 05/14/22 at 215  pm       CCM (HYPERLIPIDEMIA) EXPECTED OUTCOME: MONITOR, SELF-MANAGE AND REDUCE SYMPTOMS OF HYPERLIPIDEMIA       Current Barriers:  Knowledge Deficits related to Hyperlipidemia management Chronic Disease Management support and education needs related to Hyperlipidemia, diet Cognitive Deficits No Advanced Directives in place- pt requests documents Patient reports he does not do a lot of exercise  Planned Interventions: Provider established cholesterol goals reviewed; Counseled on importance of regular laboratory monitoring as prescribed; Provided HLD educational materials; Reviewed importance of limiting foods high in cholesterol; Reviewed exercise goals and target of 150 minutes per week; Screening for signs and symptoms of depression related to chronic disease state;  Assessed social determinant of health barriers;   Symptom Management: Take medications as prescribed   Attend all scheduled provider appointments Call pharmacy for medication refills 3-7 days in advance of running out of medications Attend church or other social activities Call provider office for new concerns or questions  - call doctor with any symptoms you believe are related to your medicine - call doctor when you experience any new symptoms - go to all doctor appointments as scheduled - adhere to prescribed diet: heart healthy - develop an exercise routine Look over education sent via my chart- heart healthy diet  Follow Up Plan: Telephone follow up appointment with care management team member scheduled for:  05/14/22 at 215 pm          Plan:Telephone follow up appointment with care management team member scheduled for:  05/14/22 at 215 pm  Jacqlyn Larsen [facility], BSN RN Case Manager [facility] [phone]

## 2022-03-17 NOTE — Plan of Care (Signed)
Chronic Care Management Provider Comprehensive Care Plan    03/17/2022 Name: Jonathan Poole MRN: [id number] DOB: [birth year]  Referral to Chronic Care Management (CCM) services was placed by Provider:  Ihor Dow MD on Date: 02/05/22.  Chronic Condition 1: ALZHEIMER'S DEMENTIA Provider Assessment and Plan   Alzheimer's dementia with behavioral disturbance ([place])       Early cognitive decline with concerns of anxiety and angry behavior at times MMSE: 22/30 Had started Donepezil after discussion in the last visit, increased dose to 10 mg daily He needs hearing aide to help with hearing, as untreated hearing loss can worsen dementia Referred to CCM to help with local behavioral therapy and support groups for dementia        Relevant Medications    gabapentin (NEURONTIN) 600 MG tablet    Other Relevant Orders    AMB Referral to Chronic Care Management Services    [id number]+EGFR    Expected Outcome/Goals Addressed This Visit (Provider CCM goals/Provider Assessment and plan  CCM (DEMENTIA) EXPECTED OUTCOME: MONITOR, SELF-MANAGE AND REDUCE SYMPTOMS OF DEMENTIA  Symptom Management Condition 1: Take medications as prescribed   Attend all scheduled provider appointments Call pharmacy for medication refills 3-7 days in advance of running out of medications Call provider office for new concerns or questions  Tips for everyday care of patients with dementia: keep a routine, such as bathing dressing eating at the same time each day, plan activities that the person enjoys and try to do them at the same time each day, serve meals in a consistent, familiar place and give patient enough time to eat, encourage use of loose-fitting, comfortable, easy to use clothing such as, elastic waistbands, fabric fasteners, or large zipper pulls instead of shoelaces, buttons, or buckles. RN care manager will contact you this week with a list of resources for management of dementia and resources for  caregiver  Chronic Condition 2: ATRIAL FIBRILLATION Provider Assessment and Plan  PAF (paroxysmal atrial fibrillation) ([place])       H/o PAF On Xarelto Follows up with Cardiologist        Relevant Orders    AMB Referral to Chronic Care Management Services     Expected Outcome/Goals Addressed This Visit (Provider CCM goals/Provider Assessment and plan  CCM (ATRIAL FIBRILLATION) EXPECTED OUTCOME: MONITOR, SELF-MANAGE AND REDUCE SYMPTOMS OF ATRIAL FIBRILLATION  Symptom Management Condition 2: Take medications as prescribed   Attend all scheduled provider appointments Call pharmacy for medication refills 3-7 days in advance of running out of medications Call provider office for new concerns or questions  - begin a symptom diary - bring symptom diary to all appointments - check pulse (heart) rate once a day - make a plan to exercise regularly - make a plan to eat healthy - keep all lab appointments - take medicine as prescribed - wear medical alert identification Look over education sent via my chart- Atrial Fibrillation Advanced directives packed mailed  Chronic Condition 3: HYPERLIPIDEMIA Provider Assessment and Plan   HLD (hyperlipidemia)       On Pravastatin Check lipid profile        Relevant Orders    AMB Referral to Chronic Care Management Services    Lipid Profile     Expected Outcome/Goals Addressed This Visit (Provider CCM goals/Provider Assessment and plan  CCM (HYPERLIPIDEMIA) EXPECTED OUTCOME: MONITOR, SELF-MANAGE AND REDUCE SYMPTOMS OF HYPERLIPIDEMIA  Symptom Management Condition 3: Take medications as prescribed   Attend all scheduled provider appointments Call pharmacy for medication refills 3-7 days  in advance of running out of medications Attend church or other social activities Call provider office for new concerns or questions  - call doctor with any symptoms you believe are related to your medicine - call doctor when you experience any new  symptoms - go to all doctor appointments as scheduled - adhere to prescribed diet: heart healthy - develop an exercise routine Look over education sent via my chart- heart healthy diet  Problem List Patient Active Problem List   Diagnosis Date Noted   Encounter for general adult medical examination with abnormal findings 02/05/2022   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/22/2021   Chronic prescription opiate use 03/21/2021   H/O cardiac pacemaker 04/12/2020   PAF (paroxysmal atrial fibrillation) ([place]) 04/12/2020   Alzheimer's dementia with behavioral disturbance ([place]) 04/12/2020   DDD (degenerative disc disease), lumbar 04/12/2020   HLD (hyperlipidemia) 04/12/2020   Sinus node dysfunction ([place]) 01/02/2020   Elevated PSA 06/20/2019    Medication Management  Current Outpatient Medications:    acetaminophen (TYLENOL) 500 MG tablet, Take 1,000 mg by mouth every 6 (six) hours as needed for moderate pain or headache., Disp: , Rfl:    donepezil (ARICEPT) 10 MG tablet, TAKE 1 TABLET AT BEDTIME, Disp: 90 tablet, Rfl: 1   gabapentin (NEURONTIN) 600 MG tablet, Take 600 mg by mouth 3 (three) times daily., Disp: , Rfl:    morphine (MS CONTIN) 30 MG 12 hr tablet, Take 30 mg by mouth every 8 (eight) hours., Disp: , Rfl:    naloxone (NARCAN) nasal spray 4 mg/0.1 mL, SMARTSIG:Both Nares, Disp: , Rfl:    ondansetron (ZOFRAN) 4 MG tablet, Take 1 tablet (4 mg total) by mouth every 8 (eight) hours as needed for nausea or vomiting., Disp: 20 tablet, Rfl: 0   Oxycodone HCl 10 MG TABS, Take 10 mg by mouth 4 (four) times daily as needed., Disp: , Rfl:    rivaroxaban (XARELTO) 20 MG TABS tablet, Take 1 tablet (20 mg total) by mouth daily with supper., Disp: 30 tablet, Rfl: 11   rosuvastatin (CRESTOR) 20 MG tablet, TAKE 1 TABLET ONE TIME DAILY, Disp: 90 tablet, Rfl: 3   tiZANidine (ZANAFLEX) 4 MG tablet, Take 1 tablet (4 mg total) by mouth 3 (three) times daily., Disp: 30 tablet, Rfl: 1  Cognitive Assessment Identity  Confirmed: : Name; DOB Cognitive Status: Abnormal (able to state name, DOB , address today, has dementia per spouse, this can vary throughout the day, states pt is very forgetful)   Functional Assessment Hearing Difficulty or Deaf: yes Hearing Management: has bil hearing aides but does not wear them Wear Glasses or Blind: yes Vision Management: can see well w/ glasses Concentrating, Remembering or Making Decisions Difficulty (CP): yes Concentration Management: Alzheimer's Dementia- forgetful per spouse and states this is most of the time, pt recognizes family members Difficulty Communicating: no Difficulty Eating/Swallowing: no Walking or Climbing Stairs Difficulty: no Dressing/Bathing Difficulty: no Doing Errands Independently Difficulty (such as shopping) (CP): no   Caregiver Assessment  Primary Source of Support/Comfort: child(ren); spouse Name of Support/Comfort Primary Source: spouse Eilam [place] and adult son People in Home: spouse Name(s) of People in Home: [place] and adult son (age 65) Family Caregiver if Needed: spouse Primary Roles/Responsibilities: retired   Planned Interventions  Counseled on increased risk of stroke due to Afib and benefits of anticoagulation for stroke prevention           Reviewed importance of adherence to anticoagulant exactly as prescribed Counseled on importance of  regular laboratory monitoring as prescribed Afib action plan reviewed Screening for signs and symptoms of depression related to chronic disease state Assessed social determinant of health barriers Advanced directives packet mailed Education sent via my chart- Atrial Fibrillation Collaborated with Social worker Daneen Schick, Reviewed medications including importance of taking as prescribed, Depression screen completed, Emotional Support Provided to patient/caregiver, [place] , Discussed importance of discussing diagnosis with provider,  Discussed importance of attendance to all provider appointments, and Advised to contact provider for new or worsening symptoms Reinforced tips for everyday care of patients with dementia: keep a routine, such as bathing dressing eating at the same time each day, plan activities that the person enjoys and try to do them at the same time each day, serve meals in a consistent, familiar place and give patient enough time to eat, encourage use of loose-fitting, comfortable, easy to use clothing such as, elastic waistbands, fabric fasteners, or large zipper pulls instead of shoelaces, buttons, or buckles. RN care manager attempted to call patient/ spouse back today to provide resources after collaborating with social worker and unable to reach, will attempt again this week. In basket message sent to primary care provider asking if pt is supposed to take Vitamin D due to level was low on 07/13/20 or have level rechecked, ask if pt had CT scan ordered February 2023 (does not appear pt had the scan) and ask if needs to be reordered. Provider established cholesterol goals reviewed; Counseled on importance of regular laboratory monitoring as prescribed; Provided HLD educational materials; Reviewed importance of limiting foods high in cholesterol; Reviewed exercise goals and target of 150 minutes per week; Screening for signs and symptoms of depression related to chronic disease state;  Assessed social determinant of health barriers;   Interaction and coordination with outside resources, practitioners, and providers See CCM Referral  Care Plan: Available in MyChart

## 2022-03-18 ENCOUNTER — Ambulatory Visit: Payer: Medicare HMO | Admitting: *Deleted

## 2022-03-18 DIAGNOSIS — I480 Paroxysmal atrial fibrillation: Secondary | ICD-10-CM

## 2022-03-18 DIAGNOSIS — G309 Alzheimer's disease, unspecified: Secondary | ICD-10-CM

## 2022-03-18 NOTE — Chronic Care Management (AMB) (Signed)
Chronic Care Management   CCM RN Visit Note  03/18/2022 Name: Jonathan Poole MRN: [id number] DOB: 01-06-1956  Subjective: Jonathan Poole is a 66 y.o. year old male who is a primary care patient of Lindell Spar, MD. The patient was referred to the Chronic Care Management team for assistance with care management needs subsequent to provider initiation of CCM services and plan of care.    Today's Visit:  Engaged with patient by telephone for follow up visit.        Goals Addressed             This Visit's Progress    CCM (DEMENTIA) EXPECTED OUTCOME: MONITOR, SELF-MANAGE AND REDUCE SYMPTOMS OF DEMENTIA       Current Barriers:  Knowledge Deficits related to Alzheimer's dementia management, resources Care Coordination needs related to caregiver, dementia resources in a patient with Alzheimer's dementia Chronic Disease Management support and education needs related to Alzheimer's dementia Cognitive Deficits No Advanced Directives in place- documents to be mailed Spoke with patient and spouse who report pt lives with spouse and adult son, pt states he is independent with ADL's and continues to drive but not alone, spouse states pt is forgetful most of the time, pt oriented x 3 today during assessment, pt recognizes family members, has agitation at times.  Spouse states she is not interested in caregiver support that involves a group. Spoke with patient who is interested in dementia education, assistance for caregivers.  Verified pt has been taking multivitamin but not Vitamin D, per primary care provider pt is to take 2000U daily.  Per spouse pt did not have CT scan back in Feb 2023 for headache, dizziness, did not feel this would help anything.  Planned Interventions: Collaborated with Social worker Daneen Schick, Reviewed medications including importance of taking as prescribed, Depression screen completed, Emotional Support Provided to patient/caregiver, [name] , Discussed importance of discussing diagnosis with provider, Discussed importance of attendance to all provider appointments, and Advised to contact provider for new or worsening symptoms Reinforced tips for everyday care of patients with dementia: keep a routine, such as bathing dressing eating at the same time each day, plan activities that the person enjoys and try to do them at the same time each day, serve meals in a consistent, familiar place and give patient enough time to eat, encourage use of loose-fitting, comfortable, easy to use clothing such as, elastic waistbands, fabric fasteners, or large zipper pulls instead of shoelaces, buttons, or buckles. RN care manager attempted to call patient/ spouse back today to provide resources after collaborating with social worker and unable to reach, will attempt again this week. In basket message sent to primary care provider asking if pt is supposed to take Vitamin D due to level was low on 07/13/20 or have level rechecked, ask if pt had CT scan ordered February 2023 (does not appear pt had the scan) and ask if needs to be reordered. Spouse prefers dementia resources be emailed to her, Dentist of resources to [email or web address] Instructed per message from Dr. Posey Pronto, pt is to take Vitamin D 2000 units daily, spouse verbalizes understanding  Symptom Management: Take medications as prescribed   Attend all scheduled provider appointments Call pharmacy for medication refills 3-7 days in advance of running out of medications Call provider office for new concerns or questions  Tips for everyday care of patients with dementia: keep a routine, such as bathing dressing eating  at the same time each day, plan activities that the person enjoys and try to do them at the same time each day, serve meals in a consistent, familiar place and give patient enough time to eat, encourage use of loose-fitting, comfortable, easy to use  clothing such as, elastic waistbands, fabric fasteners, or large zipper pulls instead of shoelaces, buttons, or buckles. RN care manager will contact you this week with a list of resources for management of dementia and resources for caregiver Take Vitamin D 2000 units daily per primary care provider order Dementia resources emailed to you, please call RN care manager if any questions  Follow Up Plan: Telephone follow up appointment with care management team member scheduled for: 05/14/22 at 215 pm          Plan:Telephone follow up appointment with care management team member scheduled for:  05/14/22 at 215 pm  Jacqlyn Larsen [facility], BSN RN Case Manager [facility] [phone]

## 2022-03-18 NOTE — Patient Instructions (Signed)
Please call the care guide team at [phone] if you need to cancel or reschedule your appointment.   If you are experiencing a Mental Health or [place] or need someone to talk to, please call the Suicide and Crisis Lifeline: 988 call the [place] National Suicide Prevention Lifeline: 209-410-3867 or TTY: 570-276-7285 TTY 4703045478) to talk to a trained counselor call 1-800-273-TALK (toll free, 24 hour hotline) go to [facility] [address] [place] [phone]) call the [facility]: [phone] call 911   Following is a copy of the CCM Program Consent:  CCM service includes personalized support from designated clinical staff supervised by the physician, including individualized plan of care and coordination with other care providers 24/7 contact phone numbers for assistance for urgent and routine care needs. Service will only be billed when office clinical staff spend 20 minutes or more in a month to coordinate care. Only one practitioner may furnish and bill the service in a calendar month. The patient may stop CCM services at amy time (effective at the end of the month) by phone call to the office staff. The patient will be responsible for cost sharing (co-pay) or up to 20% of the service fee (after annual deductible is met)  Following is a copy of your full provider care plan:   Goals Addressed             This Visit's Progress    CCM (DEMENTIA) EXPECTED OUTCOME: MONITOR, SELF-MANAGE AND REDUCE SYMPTOMS OF DEMENTIA       Current Barriers:  Knowledge Deficits related to Alzheimer's dementia management, resources Care Coordination needs related to caregiver, dementia resources in a patient with Alzheimer's dementia Chronic Disease Management support and education needs related to Alzheimer's dementia Cognitive Deficits No Advanced Directives in place- documents to be mailed Spoke with patient and  spouse who report pt lives with spouse and adult son, pt states he is independent with ADL's and continues to drive but not alone, spouse states pt is forgetful most of the time, pt oriented x 3 today during assessment, pt recognizes family members, has agitation at times.  Spouse states she is not interested in caregiver support that involves a group. Spoke with patient who is interested in dementia education, assistance for caregivers.  Verified pt has been taking multivitamin but not Vitamin D, per primary care provider pt is to take 2000U daily.  Per spouse pt did not have CT scan back in Feb 2023 for headache, dizziness, did not feel this would help anything.  Planned Interventions: Collaborated with Social worker Daneen Schick, Reviewed medications including importance of taking as prescribed, Depression screen completed, Emotional Support Provided to patient/caregiver, [place] , Discussed importance of discussing diagnosis with provider, Discussed importance of attendance to all provider appointments, and Advised to contact provider for new or worsening symptoms Reinforced tips for everyday care of patients with dementia: keep a routine, such as bathing dressing eating at the same time each day, plan activities that the person enjoys and try to do them at the same time each day, serve meals in a consistent, familiar place and give patient enough time to eat, encourage use of loose-fitting, comfortable, easy to use clothing such as, elastic waistbands, fabric fasteners, or large zipper pulls instead of shoelaces, buttons, or buckles. RN care manager attempted to call patient/ spouse back today to provide resources after collaborating with social worker and unable to reach, will attempt again this  week. In basket message sent to primary care provider asking if pt is supposed to take Vitamin D due to level was low on 07/13/20 or have level rechecked, ask if pt had CT scan  ordered February 2023 (does not appear pt had the scan) and ask if needs to be reordered. Spouse prefers dementia resources be emailed to her, Dentist of resources to [email or web address] Instructed per message from Dr. Posey Pronto, pt is to take Vitamin D 2000 units daily, spouse verbalizes understanding  Symptom Management: Take medications as prescribed   Attend all scheduled provider appointments Call pharmacy for medication refills 3-7 days in advance of running out of medications Call provider office for new concerns or questions  Tips for everyday care of patients with dementia: keep a routine, such as bathing dressing eating at the same time each day, plan activities that the person enjoys and try to do them at the same time each day, serve meals in a consistent, familiar place and give patient enough time to eat, encourage use of loose-fitting, comfortable, easy to use clothing such as, elastic waistbands, fabric fasteners, or large zipper pulls instead of shoelaces, buttons, or buckles. RN care manager will contact you this week with a list of resources for management of dementia and resources for caregiver Take Vitamin D 2000 units daily per primary care provider order Dementia resources emailed to you, please call RN care manager if any questions  Follow Up Plan: Telephone follow up appointment with care management team member scheduled for: 05/14/22 at 215 pm          Patient verbalizes understanding of instructions and care plan provided today and agrees to view in [place]. Active MyChart status and patient understanding of how to access instructions and care plan via MyChart confirmed with patient.     Telephone follow up appointment with care management team member scheduled for:  05/14/22 at 215 pm

## 2022-04-01 ENCOUNTER — Ambulatory Visit (INDEPENDENT_AMBULATORY_CARE_PROVIDER_SITE_OTHER): Payer: Medicare HMO

## 2022-04-01 DIAGNOSIS — I495 Sick sinus syndrome: Secondary | ICD-10-CM | POA: Diagnosis not present

## 2022-04-01 LAB — CUP PACEART REMOTE DEVICE CHECK
Battery Remaining Longevity: 93 mo
Battery Remaining Percentage: 80 %
Battery Voltage: 3.01 V
Brady Statistic AP VP Percent: 2.4 %
Brady Statistic AP VS Percent: 97 %
Brady Statistic AS VP Percent: 1 %
Brady Statistic AS VS Percent: 1 %
Brady Statistic RA Percent Paced: 98 %
Brady Statistic RV Percent Paced: 2.4 %
Date Time Interrogation Session: 20240220040013
Implantable Lead Connection Status: 753985
Implantable Lead Connection Status: 753985
Implantable Lead Implant Date: 20100504
Implantable Lead Implant Date: 20100524
Implantable Lead Location: 753859
Implantable Lead Location: 753860
Implantable Pulse Generator Implant Date: 20211122
Lead Channel Impedance Value: 410 Ohm
Lead Channel Impedance Value: 480 Ohm
Lead Channel Pacing Threshold Amplitude: 0.75 V
Lead Channel Pacing Threshold Amplitude: 1.125 V
Lead Channel Pacing Threshold Pulse Width: 0.3 ms
Lead Channel Pacing Threshold Pulse Width: 0.7 ms
Lead Channel Sensing Intrinsic Amplitude: 3.4 mV
Lead Channel Sensing Intrinsic Amplitude: 5 mV
Lead Channel Setting Pacing Amplitude: 1.375
Lead Channel Setting Pacing Amplitude: 1.5 V
Lead Channel Setting Pacing Pulse Width: 0.7 ms
Lead Channel Setting Sensing Sensitivity: 0.7 mV
Pulse Gen Model: 2272
Pulse Gen Serial Number: 3864825

## 2022-04-02 ENCOUNTER — Encounter: Payer: Self-pay | Admitting: Internal Medicine

## 2022-04-08 ENCOUNTER — Telehealth: Payer: Self-pay | Admitting: Internal Medicine

## 2022-04-08 NOTE — Telephone Encounter (Signed)
STAT if patient feels like he/she is going to faint   Are you dizzy now?  Wife is unsure, and states patient describes it as more of a lightheadedness than dizziness  Do you feel faint or have you passed out?  No   Do you have any other symptoms?  No   Have you checked your HR and BP (record if available)? BP was checked a few weeks ago and patient's wife states it was normal.

## 2022-04-09 NOTE — Telephone Encounter (Signed)
Left a message for patient/spouse to call office back.

## 2022-04-09 NOTE — Telephone Encounter (Signed)
Pt called back per message from [place];   Message shared with Dr. Lovena Le, orders per Dr. Lovena Le: increase salt intake, and fluid intake.  If symptoms do not go away, an appointment with Dr. Posey Pronto, PCP will be needed.    Information shared with Pt wife, and she stated they will try this to see if his symptoms improve.  If it does not help over a week, will f/u with PCP.  Pt spouse understood orders.

## 2022-04-09 NOTE — Telephone Encounter (Signed)
Patient's wife is returning call. Requesting return call.

## 2022-04-09 NOTE — Telephone Encounter (Signed)
Pt states that he has been feeling lightheaded on and off  for about a year. On yesterday the lightlessness lasted longer. Pt denies passing out. Pt wife states that pt did mention this to Dr. Lovena Le at his last visit and was instructed to increase salt intake and to change positions slowly. Wife states that is not helping. Current BP is 115/79 Hr. 74. Please advise.

## 2022-04-10 ENCOUNTER — Other Ambulatory Visit: Payer: Self-pay | Admitting: Internal Medicine

## 2022-04-10 ENCOUNTER — Encounter: Payer: Self-pay | Admitting: Radiology

## 2022-04-10 DIAGNOSIS — F02818 Dementia in other diseases classified elsewhere, unspecified severity, with other behavioral disturbance: Secondary | ICD-10-CM | POA: Diagnosis not present

## 2022-04-10 DIAGNOSIS — G309 Alzheimer's disease, unspecified: Secondary | ICD-10-CM | POA: Diagnosis not present

## 2022-04-10 DIAGNOSIS — E785 Hyperlipidemia, unspecified: Secondary | ICD-10-CM | POA: Diagnosis not present

## 2022-04-10 DIAGNOSIS — I4891 Unspecified atrial fibrillation: Secondary | ICD-10-CM

## 2022-05-02 NOTE — Progress Notes (Signed)
Remote pacemaker transmission.   

## 2022-05-14 ENCOUNTER — Telehealth: Payer: Medicare HMO

## 2022-05-14 ENCOUNTER — Telehealth: Payer: Self-pay | Admitting: *Deleted

## 2022-05-14 NOTE — Telephone Encounter (Signed)
   CCM RN Visit Note   05/14/22 Name: Jonathan Poole MRN: [id number]      DOB: 07-25-1955  Subjective: Jonathan Poole is a 67 y.o. year old male who is a primary care patient of Ihor Dow MD. The patient was referred to the Chronic Care Management team for assistance with care management needs subsequent to provider initiation of CCM services and plan of care.      An unsuccessful telephone outreach was attempted today to contact the patient about Chronic Care Management needs.    Plan:Telephone follow up appointment with care management team member scheduled for:  upon care guide rescheduling.  Jacqlyn Larsen [facility], BSN RN Case Manager [facility] [phone]

## 2022-05-20 ENCOUNTER — Other Ambulatory Visit: Payer: Self-pay | Admitting: Internal Medicine

## 2022-05-20 DIAGNOSIS — R030 Elevated blood-pressure reading, without diagnosis of hypertension: Secondary | ICD-10-CM | POA: Diagnosis not present

## 2022-05-20 DIAGNOSIS — Z87891 Personal history of nicotine dependence: Secondary | ICD-10-CM | POA: Diagnosis not present

## 2022-05-20 DIAGNOSIS — F0280 Dementia in other diseases classified elsewhere without behavioral disturbance: Secondary | ICD-10-CM | POA: Diagnosis not present

## 2022-05-20 DIAGNOSIS — G309 Alzheimer's disease, unspecified: Secondary | ICD-10-CM | POA: Diagnosis not present

## 2022-05-28 ENCOUNTER — Telehealth: Payer: Self-pay | Admitting: *Deleted

## 2022-05-28 NOTE — Telephone Encounter (Signed)
   CCM RN Visit Note   05/28/22 Name: EFFREY DAVIDOW MRN: [id number]      DOB: 1955/04/30  Subjective: Pearletha Furl Poblano is a 67 y.o. year old male who is a primary care patient of Trena Platt MD. The patient was referred to the Chronic Care Management team for assistance with care management needs subsequent to provider initiation of CCM services and plan of care.      Second unsuccessful telephone outreach was attempted today to contact the patient about Chronic Care Management needs.    Plan:Telephone follow up appointment with care management team member scheduled for:  upon care guide rescheduling.  Irving Shows [facility], BSN RN Case Manager [facility] [phone]

## 2022-06-05 ENCOUNTER — Other Ambulatory Visit: Payer: Self-pay | Admitting: Internal Medicine

## 2022-06-05 ENCOUNTER — Telehealth: Payer: Self-pay | Admitting: Internal Medicine

## 2022-06-05 ENCOUNTER — Telehealth: Payer: Self-pay

## 2022-06-05 NOTE — Progress Notes (Unsigned)
  Chronic Care Management Note  06/05/2022 Name: AROLDO GALLI MRN: [id number] DOB: 1955/08/31  Pearletha Furl Vanlue is a 67 y.o. year old male who is a primary care patient of Anabel Halon, MD and is actively engaged with the Chronic Care Management team. I reached out to Madhav A Jaskot by phone today to assist with re-scheduling a follow up visit with the RN Case Manager  Follow up plan: Unsuccessful telephone outreach attempt made. A HIPAA compliant phone message was left for the patient providing contact information and requesting a return call.  The care management team will reach out to the patient again over the next 7 days.  If patient returns call to provider office, please advise to call CCM Care Guide Penne Lash  at [phone]  Penne Lash, RMA Care Guide [facility]  [place], [place] [address] Direct Dial: [phone] [email or web address]

## 2022-06-05 NOTE — Telephone Encounter (Signed)
Spoke to wife

## 2022-06-05 NOTE — Telephone Encounter (Signed)
Patient called said he still waiting on a brain scan that was discuss last visit., not heard anything yet.

## 2022-06-09 ENCOUNTER — Ambulatory Visit: Payer: Medicare HMO | Admitting: Internal Medicine

## 2022-06-14 ENCOUNTER — Other Ambulatory Visit: Payer: Self-pay | Admitting: Family Medicine

## 2022-06-14 DIAGNOSIS — G309 Alzheimer's disease, unspecified: Secondary | ICD-10-CM

## 2022-06-17 NOTE — Progress Notes (Unsigned)
  Chronic Care Management Note  06/17/2022 Name: Jonathan Poole MRN: [id number] DOB: 1956-01-17  Jonathan Poole is a 67 y.o. year old male who is a primary care patient of Anabel Halon, MD and is actively engaged with the Chronic Care Management team. I reached out to Jonathan Poole by phone today to assist with re-scheduling a follow up visit with the RN Case Manager  Follow up plan: Unsuccessful telephone outreach attempt made. A HIPAA compliant phone message was left for the patient providing contact information and requesting a return call.  The care management team will reach out to the patient again over the next 7 days.  If patient returns call to provider office, please advise to call CCM Care Guide Penne Lash  at [phone]  Penne Lash, RMA Care Guide [facility]  [place], [place] [address] Direct Dial: [phone] [email or web address]

## 2022-06-18 NOTE — Progress Notes (Unsigned)
  Chronic Care Management Note  06/18/2022 Name: RUPPERT MCLENNON MRN: [id number] DOB: Jun 26, 1955  Pearletha Furl Zubiate is a 67 y.o. year old male who is a primary care patient of Anabel Halon, MD and is actively engaged with the Chronic Care Management team. I reached out to Antwaun A Dambach by phone today to assist with re-scheduling a follow up visit with the RN Case Manager  Follow up plan: Unsuccessful telephone outreach attempt made. A HIPAA compliant phone message was left for the patient providing contact information and requesting a return call.  The care management team will reach out to the patient again over the next 7 days.  If patient returns call to provider office, please advise to call CCM Care Guide Penne Lash  at [phone]  Penne Lash, RMA Care Guide [facility]  [place], [place] [address] Direct Dial: [phone] [email or web address]

## 2022-06-20 ENCOUNTER — Ambulatory Visit (INDEPENDENT_AMBULATORY_CARE_PROVIDER_SITE_OTHER): Payer: Medicare HMO | Admitting: *Deleted

## 2022-06-20 DIAGNOSIS — I480 Paroxysmal atrial fibrillation: Secondary | ICD-10-CM

## 2022-06-20 DIAGNOSIS — F02818 Dementia in other diseases classified elsewhere, unspecified severity, with other behavioral disturbance: Secondary | ICD-10-CM

## 2022-06-20 DIAGNOSIS — E782 Mixed hyperlipidemia: Secondary | ICD-10-CM

## 2022-06-20 NOTE — Chronic Care Management (AMB) (Signed)
Chronic Care Management   CCM RN Visit Note  06/20/2022 Name: Jonathan Poole MRN: [id number] DOB: 11-03-1955  Subjective: Jonathan Poole is a 67 y.o. year old male who is a primary care patient of Anabel Halon, MD. The patient was referred to the Chronic Care Management team for assistance with care management needs subsequent to provider initiation of CCM services and plan of care.    Today's Visit:  Engaged with patient by telephone for follow up visit.        Goals Addressed             This Visit's Progress    CCM (ATRIAL FIBRILLATION) EXPECTED OUTCOME: MONITOR, SELF-MANAGE AND REDUCE SYMPTOMS OF ATRIAL FIBRILLATION       Current Barriers:  Knowledge Deficits related to Atrial Fibrillation management Chronic Disease Management support and education needs related to Atrial Fibrillation Cognitive Deficits No Advanced Directives in place- documents previously mailed  Planned Interventions: Counseled on increased risk of stroke due to Afib and benefits of anticoagulation for stroke prevention           Reviewed importance of adherence to anticoagulant exactly as prescribed Counseled on importance of regular laboratory monitoring as prescribed Reinforced Atrial Fibrillation  Symptom Management: Take medications as prescribed   Attend all scheduled provider appointments Call pharmacy for medication refills 3-7 days in advance of running out of medications Call provider office for new concerns or questions  - begin a symptom diary - bring symptom diary to all appointments - check pulse (heart) rate once a day - make a plan to exercise regularly - make a plan to eat healthy - keep all lab appointments - take medicine as prescribed - wear medical alert identification Follow Atrial Fibrillation action plan  Follow Up Plan: Telephone follow up appointment with care management team member scheduled for:  08/22/22 at 215 pm       CCM (DEMENTIA) EXPECTED OUTCOME:  MONITOR, SELF-MANAGE AND REDUCE SYMPTOMS OF DEMENTIA       Current Barriers:  Knowledge Deficits related to Alzheimer's dementia management, resources Care Coordination needs related to caregiver, dementia resources in a patient with Alzheimer's dementia Chronic Disease Management support and education needs related to Alzheimer's dementia Cognitive Deficits No Advanced Directives in place- documents previously mailed Spoke with patient and spouse who report pt lives with spouse and adult son, pt is usually independent with ADL's and continues to drive but not alone, spouse states pt is forgetful most of the time and this is getting worse, pt recognizes family members, has agitation at times.    Planned Interventions: Collaborated with Social worker Bevelyn Ngo, Reviewed medications including importance of taking as prescribed, Depression screen completed, Emotional Support Provided to patient/caregiver, Discussed Health Care Power of Attorney , Discussed importance of discussing diagnosis with provider, Discussed importance of attendance to all provider appointments, and Advised to contact provider for new or worsening symptoms Reinforced tips for everyday care of patients with dementia: keep a routine, such as bathing dressing eating at the same time each day, plan activities that the person enjoys and try to do them at the same time each day, serve meals in a consistent, familiar place and give patient enough time to eat, encourage use of loose-fitting, comfortable, easy to use clothing such as, elastic waistbands, fabric fasteners, or large zipper pulls instead of shoelaces, buttons, or buckles. In basket message sent to [employer] [place] reporting pt cannot locate any of the resources provided and link on her  email will not open, spouse requests all resources for dementia be mailed  Symptom Management: Take medications as prescribed   Attend all scheduled provider appointments Call pharmacy  for medication refills 3-7 days in advance of running out of medications Call provider office for new concerns or questions  Tips for everyday care of patients with dementia: keep a routine, such as bathing dressing eating at the same time each day, plan activities that the person enjoys and try to do them at the same time each day, serve meals in a consistent, familiar place and give patient enough time to eat, encourage use of loose-fitting, comfortable, easy to use clothing such as, elastic waistbands, fabric fasteners, or large zipper pulls instead of shoelaces, buttons, or buckles. Social worker notified and list of resources for management of dementia and resources for caregiver to be mailed  Follow Up Plan: Telephone follow up appointment with care management team member scheduled for:  08/22/22 at 215 pm       CCM (HYPERLIPIDEMIA) EXPECTED OUTCOME: MONITOR, SELF-MANAGE AND REDUCE SYMPTOMS OF HYPERLIPIDEMIA       Current Barriers:  Knowledge Deficits related to Hyperlipidemia management Chronic Disease Management support and education needs related to Hyperlipidemia, diet Cognitive Deficits No Advanced Directives in place-documents previously mailed Patient reports he does not do a lot of exercise  Planned Interventions: Provider established cholesterol goals reviewed; Counseled on importance of regular laboratory monitoring as prescribed; Provided HLD educational materials; Reviewed importance of limiting foods high in cholesterol; Reviewed exercise goals and target of 150 minutes per week;  Symptom Management: Take medications as prescribed   Attend all scheduled provider appointments Call pharmacy for medication refills 3-7 days in advance of running out of medications Attend church or other social activities Call provider office for new concerns or questions  - call doctor with any symptoms you believe are related to your medicine - call doctor when you experience any new  symptoms - go to all doctor appointments as scheduled - adhere to prescribed diet: heart healthy - develop an exercise routine Follow heart healthy - bake or broil food instead of frying  Follow Up Plan: Telephone follow up appointment with care management team member scheduled for:  08/22/22 at 215 pm          Plan:Telephone follow up appointment with care management team member scheduled for:  08/22/22 at 215 pm  Irving Shows [facility], BSN RN Case Manager [facility] [phone]

## 2022-06-20 NOTE — Patient Instructions (Signed)
Please call the care guide team at [phone] if you need to cancel or reschedule your appointment.   If you are experiencing a Mental Health or Behavioral Health Crisis or need someone to talk to, please call the Suicide and Crisis Lifeline: 988 call the [place] National Suicide Prevention Lifeline: (681)246-3344 or TTY: 6707405100 TTY 818-059-9282) to talk to a trained counselor call 1-800-273-TALK (toll free, 24 hour hotline) go to [facility] [address], [place] [phone]) call the [facility]: [phone] call 911   Following is a copy of the CCM Program Consent:  CCM service includes personalized support from designated clinical staff supervised by the physician, including individualized plan of care and coordination with other care providers 24/7 contact phone numbers for assistance for urgent and routine care needs. Service will only be billed when office clinical staff spend 20 minutes or more in a month to coordinate care. Only one practitioner may furnish and bill the service in a calendar month. The patient may stop CCM services at amy time (effective at the end of the month) by phone call to the office staff. The patient will be responsible for cost sharing (co-pay) or up to 20% of the service fee (after annual deductible is met)  Following is a copy of your full provider care plan:   Goals Addressed             This Visit's Progress    CCM (ATRIAL FIBRILLATION) EXPECTED OUTCOME: MONITOR, SELF-MANAGE AND REDUCE SYMPTOMS OF ATRIAL FIBRILLATION       Current Barriers:  Knowledge Deficits related to Atrial Fibrillation management Chronic Disease Management support and education needs related to Atrial Fibrillation Cognitive Deficits No Advanced Directives in place- documents previously mailed  Planned Interventions: Counseled on increased risk of stroke due to Afib and benefits of anticoagulation for  stroke prevention           Reviewed importance of adherence to anticoagulant exactly as prescribed Counseled on importance of regular laboratory monitoring as prescribed Reinforced Atrial Fibrillation  Symptom Management: Take medications as prescribed   Attend all scheduled provider appointments Call pharmacy for medication refills 3-7 days in advance of running out of medications Call provider office for new concerns or questions  - begin a symptom diary - bring symptom diary to all appointments - check pulse (heart) rate once a day - make a plan to exercise regularly - make a plan to eat healthy - keep all lab appointments - take medicine as prescribed - wear medical alert identification Follow Atrial Fibrillation action plan  Follow Up Plan: Telephone follow up appointment with care management team member scheduled for:  08/22/22 at 215 pm       CCM (DEMENTIA) EXPECTED OUTCOME: MONITOR, SELF-MANAGE AND REDUCE SYMPTOMS OF DEMENTIA       Current Barriers:  Knowledge Deficits related to Alzheimer's dementia management, resources Care Coordination needs related to caregiver, dementia resources in a patient with Alzheimer's dementia Chronic Disease Management support and education needs related to Alzheimer's dementia Cognitive Deficits No Advanced Directives in place- documents previously mailed Spoke with patient and spouse who report pt lives with spouse and adult son, pt is usually independent with ADL's and continues to drive but not alone, spouse states pt is forgetful most of the time and this is getting worse, pt recognizes family members, has agitation at times.    Planned Interventions: Collaborated with Social worker Bevelyn Ngo, Reviewed medications including importance of taking as prescribed,  Depression screen completed, Emotional Support Provided to patient/caregiver, Discussed Health Care Power of Attorney , Discussed importance of discussing diagnosis with provider,  Discussed importance of attendance to all provider appointments, and Advised to contact provider for new or worsening symptoms Reinforced tips for everyday care of patients with dementia: keep a routine, such as bathing dressing eating at the same time each day, plan activities that the person enjoys and try to do them at the same time each day, serve meals in a consistent, familiar place and give patient enough time to eat, encourage use of loose-fitting, comfortable, easy to use clothing such as, elastic waistbands, fabric fasteners, or large zipper pulls instead of shoelaces, buttons, or buckles. In basket message sent to [employer] reporting pt cannot locate any of the resources provided and link on her email will not open, spouse requests all resources for dementia be mailed  Symptom Management: Take medications as prescribed   Attend all scheduled provider appointments Call pharmacy for medication refills 3-7 days in advance of running out of medications Call provider office for new concerns or questions  Tips for everyday care of patients with dementia: keep a routine, such as bathing dressing eating at the same time each day, plan activities that the person enjoys and try to do them at the same time each day, serve meals in a consistent, familiar place and give patient enough time to eat, encourage use of loose-fitting, comfortable, easy to use clothing such as, elastic waistbands, fabric fasteners, or large zipper pulls instead of shoelaces, buttons, or buckles. Social worker notified and list of resources for management of dementia and resources for caregiver to be mailed  Follow Up Plan: Telephone follow up appointment with care management team member scheduled for:  08/22/22 at 215 pm       CCM (HYPERLIPIDEMIA) EXPECTED OUTCOME: MONITOR, SELF-MANAGE AND REDUCE SYMPTOMS OF HYPERLIPIDEMIA       Current Barriers:  Knowledge Deficits related to Hyperlipidemia management Chronic  Disease Management support and education needs related to Hyperlipidemia, diet Cognitive Deficits No Advanced Directives in place-documents previously mailed Patient reports he does not do a lot of exercise  Planned Interventions: Provider established cholesterol goals reviewed; Counseled on importance of regular laboratory monitoring as prescribed; Provided HLD educational materials; Reviewed importance of limiting foods high in cholesterol; Reviewed exercise goals and target of 150 minutes per week;  Symptom Management: Take medications as prescribed   Attend all scheduled provider appointments Call pharmacy for medication refills 3-7 days in advance of running out of medications Attend church or other social activities Call provider office for new concerns or questions  - call doctor with any symptoms you believe are related to your medicine - call doctor when you experience any new symptoms - go to all doctor appointments as scheduled - adhere to prescribed diet: heart healthy - develop an exercise routine Follow heart healthy - bake or broil food instead of frying  Follow Up Plan: Telephone follow up appointment with care management team member scheduled for:  08/22/22 at 215 pm          Patient verbalizes understanding of instructions and care plan provided today and agrees to view in MyChart. Active MyChart status and patient understanding of how to access instructions and care plan via MyChart confirmed with patient.  Telephone follow up appointment with care management team member scheduled for:   08/22/22 at 215 pm  Alzheimer's Disease Caregiver Guide Alzheimer's disease is a brain disease that causes memory  loss and changes in behavior. People with Alzheimer's disease often have problems paying attention, communicating, and doing routine tasks. The disease gets worse over time, and people with the disease eventually need full-time care. Taking care of someone with  Alzheimer's disease can be challenging and overwhelming. This guide provides helpful information and tips that can make caring for someone with the disease a little easier. How to help manage lifestyle changes Managing symptoms Be calm and patient. Give short, simple answers to questions. Long answers can overwhelm and confuse the person. Avoid correcting the person in a negative way. Try not to take things personally, even if the person forgets your name. Understand that changes are a part of the disease process. Do not argue or try to convince the person about a specific point. Doing that may make the person feel more agitated. Reducing frustration Make appointments and do daily tasks, like bathing and dressing, when the person is at his or her best. Allow for plenty of time for simple tasks because they may take longer than expected. Take your time when doing these tasks. Limit the person's choices. Too many choices can be overwhelming and stressful for the person. Involve the person in what you are doing. Take steps to help keep things organized such as: Keeping a daily routine. Organizing medicines in a pillbox for each day of the week. Keeping a calendar in a central location to remind the person of appointments or other activities. Avoid crowds and new situations, if possible. Use simple words, short sentences, and a calm voice. Only give one direction at a time. Buy clothes and shoes for the person that are easy to put on and take off. Try to change the subject or topic if the person becomes frustrated or angry. Distraction is a great technique for making a situation less tense. Reducing the risk of injury  Keep floors clear of clutter. Remove rugs, magazine racks, and floor lamps. Keep hallways well-lit, especially at night. Put a handrail and nonslip mat in the bathtub or shower. Put childproof locks on cabinets that contain dangerous items, such as medicines, alcohol, guns, toxic  cleaning items, sharp tools or utensils, matches, and lighters. Put locks on doors. Put the locks in places where the person cannot see or reach them easily. This will help ensure that the person does not wander out of the house and get lost. Be prepared for emergencies. Keep a list of emergency phone numbers and addresses in a convenient area. Remove car keys and lock garage doors so that the person does not try to get in the car and drive. A certain type of bracelet may be worn that tracks a person's location and identifies him or her as having memory problems. This should be worn at all times for safety. Planning for the future  Discuss financial and legal planning early on in the course of the disease. People with Alzheimer's disease will have trouble managing their money as the disease gets worse. Get help from professional advisers regarding financial and legal matters. Discuss advance directives, safety, and daily care. Take these steps: Create a living will and choose a power of attorney. The person with power of attorney will be able to make decisions for the person with Alzheimer's disease when he or she is no longer able to. Discuss driving safety and when to stop driving. The person's health care provider can help provide assistance with this decision. Discuss the person's living situation. If the person lives alone, make  sure he or she is safe. People who live at home may need extra help from home health caregivers, and those who live in a nursing home or care center may need more care. How to recognize changes in the person's condition Alzheimer's disease causes a person to lose the ability to remember things and make decisions. Memory loss and confusion are usually mild at the start of the disease, but they slowly get more severe over time. Eventually, the person may not recognize friends, family members, or familiar places. Alzheimer's disease can also cause hallucinations, changes in  behavior, and changes in mood, such as anxiety or anger. The changes can come on suddenly. They may happen in response to something such as: Pain. An infection. Changes in environment, such as changes in temperature or noise. Overstimulation. Feeling lost or scared. Medicines. Where to find support Ask about respite care resources. Respite care can provide short-term care for the person so that you can have a regular break from the stress of caregiving. Consider joining a local support group. Advantages of being part of a support group include: Learning strategies to manage stress. Sharing experiences with others. Receiving emotional comfort and support. Learning about caregiving as the disease progresses. Knowing what community resources are available and making use of them. Where to find more information Alzheimer's Association: LimitLaws.hu Contact a health care provider if: The person has a fever. The person has a sudden change in behavior that does not improve with calming strategies. The person is unable to manage in his or her current living situation. You are no longer able to care for the person. Get help right away if: The person has a sudden increase in confusion or new hallucinations. The person threatens himself or herself, you, or anyone else. If you ever feel like your loved one may hurt himself or herself or others, or shares thoughts about taking his or her own life, get help right away. You can go to your nearest emergency department or: Call your local emergency services (911 in the [place] Call a suicide crisis helpline, such as the National Suicide Prevention Lifeline at 231-536-0618 or 988 in the [place] This is open 24 hours a day in the [place] Text the Crisis Text Line at 416-573-1632 (in the [place] Summary Alzheimer's disease is a brain disease that causes memory loss and changes in behavior. People with Alzheimer's disease often have problems paying attention,  communicating, and doing routine tasks. The disease gets worse over time, and people with the disease eventually need full-time care. Take steps to reduce the person's risk of injury and plan for future care. Taking care of someone with Alzheimer's disease can be very challenging and overwhelming. One way to find support during this time is to join a local support group. This information is not intended to replace advice given to you by your health care provider. Make sure you discuss any questions you have with your health care provider. Document Revised: 08/22/2020 Document Reviewed: 05/16/2019 Elsevier Patient Education  2023 Elsevier Inc. Dementia Caregiver Guide Dementia is a term used to describe a number of symptoms that affect memory and thinking. The most common symptoms include: Memory loss. Trouble with language and communication. Trouble concentrating. Poor judgment and problems with reasoning. Wandering from home or public places. Extreme anxiety or depression. Being suspicious or having angry outbursts and accusations. Child-like behavior and language. Dementia can be frightening and confusing. And taking care of someone with dementia can be challenging. This guide provides tips  to help you when providing care for a person with dementia. How to help manage lifestyle changes Dementia usually gets worse slowly over time. In the early stages, people with dementia can stay independent and safe with some help. In later stages, they need help with daily tasks such as dressing, grooming, and using the bathroom. There are actions you can take to help a person manage his or her life while living with this condition. Communicating When the person is talking or seems frustrated, make eye contact and hold the person's hand. Ask specific questions that need yes or no answers. Use simple words, short sentences, and a calm voice. Only give one direction at a time. When offering choices, limit  the person to just one or two. Avoid correcting the person in a negative way. If the person is struggling to find the right words, gently try to help him or her. Preventing injury  Keep floors clear of clutter. Remove rugs, magazine racks, and floor lamps. Keep hallways well lit, especially at night. Put a handrail and nonslip mat in the bathtub or shower. Put childproof locks on cabinets that contain dangerous items, such as medicines, alcohol, guns, toxic cleaning items, sharp tools or utensils, matches, and lighters. For doors to the outside of the house, put the locks in places where the person cannot see or reach them easily. This will help ensure that the person does not wander out of the house and get lost. Be prepared for emergencies. Keep a list of emergency phone numbers and addresses in a convenient area. Remove car keys and lock garage doors so that the person does not try to get in the car and drive. Have the person wear a bracelet that tracks locations and identifies the person as having memory problems. This should be worn at all times for safety. Helping with daily life  Keep the person on track with his or her routine. Try to identify areas where the person may need help. Be supportive, patient, calm, and encouraging. Gently remind the person that adjusting to changes takes time. Help with the tasks that the person has asked for help with. Keep the person involved in daily tasks and decisions as much as possible. Encourage conversation, but try not to get frustrated if the person struggles to find words or does not seem to appreciate your help. How to recognize stress Look for signs of stress in yourself and in the person you are caring for. If you notice signs of stress, take steps to manage it. Symptoms of stress include: Feeling anxious, irritable, frustrated, or angry. Denying that the person has dementia or that his or her symptoms will not improve. Feeling depressed,  hopeless, or unappreciated. Difficulty sleeping. Difficulty concentrating. Developing stress-related health problems. Feeling like you have too little time for your own life. Follow these instructions at home: Take care of your health Make sure that you and the person you are caring for: Get regular sleep. Exercise regularly. Eat regular, nutritious meals. Take over-the-counter and prescription medicines only as told by your health care providers. Drink enough fluid to keep your urine pale yellow. Attend all scheduled health care appointments.  General instructions Join a support group with others who are caregivers. Ask about respite care resources. Respite care can provide short-term care for the person so that you can have a regular break from the stress of caregiving. Consider any safety risks and take steps to avoid them. Organize medicines in a pill box for each day  of the week. Create a plan to handle any legal or financial matters. Get legal or financial advice if needed. Keep a calendar in a central location to remind the person of appointments or other activities. Where to find support: Many individuals and organizations offer support. These include: Support groups for people with dementia. Support groups for caregivers. Counselors or therapists. Home health care services. Adult day care centers. Where to find more information Centers for Disease Control and Prevention: FootballExhibition.com.br Alzheimer's Association: LimitLaws.hu Family Caregiver Alliance: www.caregiver.org Alzheimer's Foundation of [place]: www.alzfdn.org Contact a health care provider if: The person's health is rapidly getting worse. You are no longer able to care for the person. Caring for the person is affecting your physical and emotional health. You are feeling depressed or anxious about caring for the person. Get help right away if: The person threatens himself or herself, you, or anyone else. You feel  depressed or sad, or feel that you want to harm yourself. If you ever feel like your loved one may hurt himself or herself or others, or if he or she shares thoughts about taking his or her own life, get help right away. You can go to your nearest emergency department or: Call your local emergency services (911 in the [place] Call a suicide crisis helpline, such as the National Suicide Prevention Lifeline at 416 682 6853 or 988 in the [place] This is open 24 hours a day in the [place] Text the Crisis Text Line at (262) 676-4762 (in the [place] Summary Dementia is a term used to describe a number of symptoms that affect memory and thinking. Dementia usually gets worse slowly over time. Take steps to reduce the person's risk of injury and to plan for future care. Caregivers need support, relief from caregiving, and time for their own lives. This information is not intended to replace advice given to you by your health care provider. Make sure you discuss any questions you have with your health care provider. Document Revised: 08/22/2020 Document Reviewed: 06/13/2019 Elsevier Patient Education  2023 [employer].

## 2022-06-23 NOTE — Patient Outreach (Signed)
  Care Coordination    06/23/2022 Name: DIVON TEAGLE MRN: [id number] DOB: [birth year]  Pearletha Furl Magar is a 67 y.o. year old male who sees Anabel Halon, MD for primary care. I  collaborated with Lorna Few, LCSW who requests BSW mail dementia resources to the patients home address. Provided information on Dementia Alliance Support Groups, Caregiver Navigator Helpline, Project CARE, and [facility] [place] via mail.  SDOH assessments and interventions completed:  No     Care Coordination Interventions:  Yes, provided   Interventions Today    Flowsheet Row Most Recent Value  Chronic Disease   Chronic disease during today's visit Other  [Dementia]  General Interventions   General Interventions Discussed/Reviewed Communication with  Communication with Social Work  Education Interventions   Education Provided Provided Education, Provided Dance movement psychotherapist [facility] [employer]        Follow up plan:  SW will follow up with the patient over the next month to confirm receipt of resources.    Encounter Outcome:  Pt. Visit Completed   Bevelyn Ngo, BSW, CDP Social Worker, Certified Dementia Practitioner [facility] Care Management  Care Coordination [phone]

## 2022-06-25 DIAGNOSIS — G894 Chronic pain syndrome: Secondary | ICD-10-CM | POA: Diagnosis not present

## 2022-06-25 DIAGNOSIS — M546 Pain in thoracic spine: Secondary | ICD-10-CM | POA: Diagnosis not present

## 2022-06-25 DIAGNOSIS — Z79891 Long term (current) use of opiate analgesic: Secondary | ICD-10-CM | POA: Diagnosis not present

## 2022-06-25 DIAGNOSIS — M542 Cervicalgia: Secondary | ICD-10-CM | POA: Diagnosis not present

## 2022-06-25 DIAGNOSIS — Z79899 Other long term (current) drug therapy: Secondary | ICD-10-CM | POA: Diagnosis not present

## 2022-06-25 DIAGNOSIS — M5116 Intervertebral disc disorders with radiculopathy, lumbar region: Secondary | ICD-10-CM | POA: Diagnosis not present

## 2022-07-01 ENCOUNTER — Other Ambulatory Visit: Payer: Self-pay | Admitting: Internal Medicine

## 2022-07-01 ENCOUNTER — Ambulatory Visit (INDEPENDENT_AMBULATORY_CARE_PROVIDER_SITE_OTHER): Payer: Medicare HMO

## 2022-07-01 DIAGNOSIS — I495 Sick sinus syndrome: Secondary | ICD-10-CM

## 2022-07-01 LAB — CUP PACEART REMOTE DEVICE CHECK
Battery Remaining Longevity: 91 mo
Battery Remaining Percentage: 78 %
Battery Voltage: 3.01 V
Brady Statistic AP VP Percent: 2.1 %
Brady Statistic AP VS Percent: 96 %
Brady Statistic AS VP Percent: 1 %
Brady Statistic AS VS Percent: 1.4 %
Brady Statistic RA Percent Paced: 97 %
Brady Statistic RV Percent Paced: 2.1 %
Date Time Interrogation Session: 20240521040016
Implantable Lead Connection Status: 753985
Implantable Lead Connection Status: 753985
Implantable Lead Implant Date: 20100504
Implantable Lead Implant Date: 20100524
Implantable Lead Location: 753859
Implantable Lead Location: 753860
Implantable Pulse Generator Implant Date: 20211122
Lead Channel Impedance Value: 410 Ohm
Lead Channel Impedance Value: 510 Ohm
Lead Channel Pacing Threshold Amplitude: 0.75 V
Lead Channel Pacing Threshold Amplitude: 1.25 V
Lead Channel Pacing Threshold Pulse Width: 0.3 ms
Lead Channel Pacing Threshold Pulse Width: 0.7 ms
Lead Channel Sensing Intrinsic Amplitude: 5 mV
Lead Channel Sensing Intrinsic Amplitude: 5.8 mV
Lead Channel Setting Pacing Amplitude: 1.5 V
Lead Channel Setting Pacing Amplitude: 1.5 V
Lead Channel Setting Pacing Pulse Width: 0.7 ms
Lead Channel Setting Sensing Sensitivity: 0.7 mV
Pulse Gen Model: 2272
Pulse Gen Serial Number: 3864825

## 2022-07-02 ENCOUNTER — Other Ambulatory Visit: Payer: Self-pay | Admitting: Internal Medicine

## 2022-07-02 ENCOUNTER — Telehealth: Payer: Self-pay | Admitting: Internal Medicine

## 2022-07-02 MED ORDER — APIXABAN 5 MG PO TABS: 5.0000 mg | ORAL_TABLET | Freq: Two times a day (BID) | ORAL | 0 refills | Status: DC

## 2022-07-02 NOTE — Telephone Encounter (Signed)
Per Dr Jonathan Poole last office note and his medication list pt is suppose to be on Xarelto instead of Eliquis.  Please confirm with patient.  Thanks

## 2022-07-02 NOTE — Telephone Encounter (Signed)
Patients wife notified and verbalized understanding. Pt willing to complete pt assistance paperwork as well.

## 2022-07-02 NOTE — Telephone Encounter (Signed)
Patient calling the office for samples of medication: ° ° °1.  What medication and dosage are you requesting samples for? ELIQUIS 5 MG TABS tablet ° °2.  Are you currently out of this medication? Yes ° ° °

## 2022-07-02 NOTE — Telephone Encounter (Signed)
Spoke to pt's wife who stated that pt has been taking Eliquis at this time even though he was supposed to be taking Xarelto.  Eliquis was last refilled by PCP- Dr. Allena Katz 05/21/2022. Wife stated that she does not believe that pt is taking both Eliquis and Xarelto.   Please advise which medication patient is supposed to be taking.

## 2022-07-02 NOTE — Telephone Encounter (Signed)
Sample request for Eliquis received. Indication: PAF Last office visit: 01/07/22  [name] MD Scr: 1.00 on 02/05/22  Epic Age: 67 Weight: 78.9kg  Based on above findings Eliquis 5mg  twice daily is the appropriate dose.  May give samples is available.

## 2022-07-02 NOTE — Telephone Encounter (Signed)
Spoke to provider who stated that pt can switch to Eliquis. Please advise on dosage.

## 2022-07-11 DIAGNOSIS — F0280 Dementia in other diseases classified elsewhere without behavioral disturbance: Secondary | ICD-10-CM | POA: Diagnosis not present

## 2022-07-11 DIAGNOSIS — G309 Alzheimer's disease, unspecified: Secondary | ICD-10-CM | POA: Diagnosis not present

## 2022-07-11 DIAGNOSIS — E785 Hyperlipidemia, unspecified: Secondary | ICD-10-CM | POA: Diagnosis not present

## 2022-07-11 DIAGNOSIS — I4891 Unspecified atrial fibrillation: Secondary | ICD-10-CM | POA: Diagnosis not present

## 2022-07-14 ENCOUNTER — Telehealth: Payer: Self-pay

## 2022-07-14 NOTE — Patient Outreach (Signed)
  Care Coordination   07/14/2022 Name: Jonathan Poole MRN: [id number] DOB: [birth year]   Care Coordination Outreach Attempts:  SW placed an unsuccessful outbound call to the patients spouse to confirm receipt of mailed resources. SW left a HIPAA compliant voice message requesting a return call.  Follow Up Plan:  Additional outreach attempts will be made to offer the patient care coordination information and services.   Encounter Outcome:  No Answer   Care Coordination Interventions:  No, not indicated    Bevelyn Ngo, BSW, CDP Social Worker, Certified Dementia Practitioner [facility] Care Management  Care Coordination [phone]

## 2022-07-16 ENCOUNTER — Ambulatory Visit: Payer: Self-pay

## 2022-07-16 NOTE — Patient Instructions (Signed)
Visit Information  Thank you for taking time to visit with me today. Please don't hesitate to contact me if I can be of assistance to you.   Following are the goals we discussed today:  - Review mailed resource information -Engage with [facility] to learn more about the program -Monitor behavior trends and speak with Dr. Allena Katz if agitation persists -Use short, simple commands when trying to complete a task   Our next appointment is by telephone on 6/26 at 12:30  Please call the care guide team at [phone] if you need to cancel or reschedule your appointment.   If you are experiencing a Mental Health or Behavioral Health Crisis or need someone to talk to, please call the [facility]: [phone] call [phone]  Patient verbalizes understanding of instructions and care plan provided today and agrees to view in MyChart. Active MyChart status and patient understanding of how to access instructions and care plan via MyChart confirmed with patient.     Bevelyn Ngo, BSW, CDP Social Worker, Certified Dementia Practitioner [facility] Care Management  Care Coordination [phone]

## 2022-07-16 NOTE — Patient Outreach (Signed)
  Care Coordination   Follow Up Visit Note   07/16/2022 Name: Jonathan Poole MRN: [id number] DOB: 08-04-1955  Jonathan Poole is a 67 y.o. year old male who sees Anabel Halon, MD for primary care. I  spoke with patients spouse and caregiver Jonathan Poole by phone to assist with care coordination needs.  What matters to the patients health and wellness today?  Jonathan Poole would like assistance with caregiver resources and education.    Goals Addressed             This Visit's Progress    Care Coordination Activities       Care Coordination Interventions: Confirmed receipt of mailed resources related to Dementia support Determined the patients spouse needs assistance with managing the patient in the home. Jonathan Poole states the patient become agitated easily so she isolates herself from him to avoid agitating him Education provided on positive approach for persons with Dementia. Gave examples of how to practice validation with the patient in order to avoid agitation and/or anxiety Advised Jonathan Poole to keep a log of patients agitation instructed her to contact the patients primary care provider if this is a daily occurrence in order to evaluate if medication would be helpful Education provided on the progression of Dementia specifically focusing on how it impacts hearing and patients ability to process information considering Jonathan Poole reports he has a hard time hearing and become angry if a family member tries to turn down the t.v Discussed placement is not an option as the family would be met with a cost burden if he were placed Reviewed options for caregiver resources including [facility] as well as [facility] Mailed educational material on [facility] for Jonathan Poole to review Discussed plans for SW to place a referral to [facility] who will outreach Jonathan Poole to answer questions about [facility] and determine if the patient will have a copay Referral placed to [place] with  [facility] who will contact Jonathan Poole today         SDOH assessments and interventions completed:  No     Care Coordination Interventions:  Yes, provided   Interventions Today    Flowsheet Row Most Recent Value  Chronic Disease   Chronic disease during today's visit Other  [Alzheimers]  General Interventions   General Interventions Discussed/Reviewed General Interventions Discussed, Tax adviser to [employer] [facility]]  Education Interventions   Education Provided Provided Education  Provided Verbal Education On [employer]  [[facility]]        Follow up plan:  SW will continue to follow.    Encounter Outcome:  Pt. Visit Completed   Bevelyn Ngo, BSW, CDP Social Worker, Certified Dementia Practitioner [facility] Care Management  Care Coordination [phone]

## 2022-07-23 DIAGNOSIS — M546 Pain in thoracic spine: Secondary | ICD-10-CM | POA: Diagnosis not present

## 2022-07-23 DIAGNOSIS — G894 Chronic pain syndrome: Secondary | ICD-10-CM | POA: Diagnosis not present

## 2022-07-23 DIAGNOSIS — M5116 Intervertebral disc disorders with radiculopathy, lumbar region: Secondary | ICD-10-CM | POA: Diagnosis not present

## 2022-07-23 DIAGNOSIS — Z79891 Long term (current) use of opiate analgesic: Secondary | ICD-10-CM | POA: Diagnosis not present

## 2022-07-23 DIAGNOSIS — M542 Cervicalgia: Secondary | ICD-10-CM | POA: Diagnosis not present

## 2022-07-24 NOTE — Progress Notes (Signed)
Remote pacemaker transmission.   

## 2022-08-06 ENCOUNTER — Telehealth: Payer: Self-pay

## 2022-08-06 NOTE — Patient Outreach (Signed)
  Care Coordination   08/06/2022 Name: Jonathan Poole MRN: [id number] DOB: [birth year]   Care Coordination Outreach Attempts:  An unsuccessful telephone outreach was attempted for a scheduled appointment today.  Follow Up Plan:  Additional outreach attempts will be made to offer the patient care coordination information and services.   Encounter Outcome:  No Answer   Care Coordination Interventions:  No, not indicated    Bevelyn Ngo, BSW, CDP Social Worker, Certified Dementia Practitioner [facility] Care Management  Care Coordination [phone]

## 2022-08-11 ENCOUNTER — Telehealth: Payer: Self-pay

## 2022-08-11 ENCOUNTER — Ambulatory Visit: Payer: Self-pay

## 2022-08-11 NOTE — Patient Instructions (Signed)
Visit Information  Thank you for taking time to visit with me today. Please don't hesitate to contact me if I can be of assistance to you.   Following are the goals we discussed today:  - Follow up with your primary care provider as needed regarding concern with agitation - Engage with Care Coordination team as scheduled   Your next appointment is by telephone on 7/15 at 2:15 pm with LCSW  Danford Bad  Please call the care guide team at [phone] if you need to cancel or reschedule your appointment.   If you are experiencing a Mental Health or Behavioral Health Crisis or need someone to talk to, please call 1-800-273-TALK (toll free, 24 hour hotline) call the [facility]: [phone] call 911  Patient verbalizes understanding of instructions and care plan provided today and agrees to view in MyChart. Active MyChart status and patient understanding of how to access instructions and care plan via MyChart confirmed with patient.      Bevelyn Ngo, BSW, CDP Social Worker, Certified Dementia Practitioner [facility] Care Management  Care Coordination [phone]

## 2022-08-11 NOTE — Patient Outreach (Signed)
  Care Coordination   08/11/2022 Name: Jonathan Poole MRN: [id number] DOB: 10/29/1955   Care Coordination Outreach Attempts:  SW placed a second unsuccessful outbound call to the patient to follow up on care coordination needs. SW left a HIPAA compliant voice message requesting a return call.  Follow Up Plan:  Additional outreach attempts will be made to offer the patient care coordination information and services.   Encounter Outcome:  No Answer   Care Coordination Interventions:  No, not indicated    Bevelyn Ngo, BSW, CDP Social Worker, Certified Dementia Practitioner [facility] Care Management  Care Coordination [phone]

## 2022-08-11 NOTE — Patient Outreach (Signed)
  Care Coordination   Follow Up Visit Note   08/11/2022 Name: Jonathan Poole MRN: [id number] DOB: [birth year]  Jonathan Poole is a 67 y.o. year old male who sees Jonathan Halon, MD for primary care. I  spoke with Jonathan Poole by phone to follow up on care coordination needs.  What matters to the patients health and wellness today?  Patients family would like better management of agitation.    Goals Addressed             This Visit's Progress    Care Coordination Activities       Care Coordination Interventions: Discussed the patient continues to be agitated throughout the day - no triggers identified at this time Education provided on normal triggers that may cause agitation including pain or hunger. Spouse reports patient gets agitated when spoken to so she and their son try to avoid him throughout the day Determined the family would like to add medication as an intervention to better manage patients mood - education provided on the difference in routine medications and PRN medications. Spouse feels the patient will not refuse medications as he adheres to his current medication regimen Collaboration with the patients primary care provider, Jonathan Poole, to advise of concern with patients agitation and request medication should Jonathan Poole agree Reviewed patients spouse is experiencing caregiver burnout and would like more support with her mental health - scheduled call with Jonathan Poole Collaboration with Jonathan and RN Care Manager to advise of interventions and plan Encouraged the patients spouse to contact me as needed        SDOH assessments and interventions completed:  No     Care Coordination Interventions:  Yes, provided   Interventions Today    Flowsheet Row Most Recent Value  Chronic Disease   Chronic disease during today's visit Other  [Alzheimer's]  General Interventions   General Interventions Discussed/Reviewed General Interventions Reviewed,  Communication with, Doctor Visits  Doctor Visits Discussed/Reviewed Doctor Visits Discussed  Communication with PCP/Specialists, RN, Social Work  Education Interventions   Education Provided Provided Education  Provided Verbal Education On Other  [Alzheimer's Disease Progression and Interventions]  Mental Health Interventions   Mental Health Discussed/Reviewed Refer to Social Work for counseling  Refer to Social Work for counseling regarding Artist burnout]        Follow up plan: Follow up call scheduled for 7/15 with Jonathan Poole.    Encounter Outcome:  Pt. Visit Completed   Jonathan Poole, BSW, CDP Social Worker, Certified Dementia Practitioner [facility] Care Management  Care Coordination [phone]

## 2022-08-12 ENCOUNTER — Telehealth: Payer: Self-pay

## 2022-08-12 NOTE — Patient Outreach (Signed)
  Care Coordination   Case Collaboration  Visit Note   08/12/2022 Name: Jonathan Poole MRN: [id number] DOB: [birth year]  Jonathan Poole is a 67 y.o. year old male who sees Anabel Halon, MD for primary care. I  collaborated with patients primary care provider who requests patient be seen in clinic prior to prescribing a new medication. SW placed an unsuccessful outbound call to the patients spouse. Voice message left requesting she contact Dr. Eliane Decree office to schedule a visit.   SDOH assessments and interventions completed:  No     Care Coordination Interventions:  Yes, provided   Interventions Today    Flowsheet Row Most Recent Value  Chronic Disease   Chronic disease during today's visit Other  General Interventions   General Interventions Discussed/Reviewed Communication with  Communication with PCP/Specialists        Follow up plan: No further intervention required. The patient will be followed by LCSW as previously scheduled.    Encounter Outcome:  Pt. Visit Completed   Bevelyn Ngo, BSW, CDP Social Worker, Certified Dementia Practitioner [facility] Care Management  Care Coordination [phone]

## 2022-08-20 ENCOUNTER — Telehealth: Payer: Self-pay | Admitting: Internal Medicine

## 2022-08-20 DIAGNOSIS — M546 Pain in thoracic spine: Secondary | ICD-10-CM | POA: Diagnosis not present

## 2022-08-20 DIAGNOSIS — G894 Chronic pain syndrome: Secondary | ICD-10-CM | POA: Diagnosis not present

## 2022-08-20 DIAGNOSIS — M542 Cervicalgia: Secondary | ICD-10-CM | POA: Diagnosis not present

## 2022-08-20 DIAGNOSIS — Z79891 Long term (current) use of opiate analgesic: Secondary | ICD-10-CM | POA: Diagnosis not present

## 2022-08-20 DIAGNOSIS — M5116 Intervertebral disc disorders with radiculopathy, lumbar region: Secondary | ICD-10-CM | POA: Diagnosis not present

## 2022-08-20 DIAGNOSIS — I480 Paroxysmal atrial fibrillation: Secondary | ICD-10-CM

## 2022-08-20 MED ORDER — APIXABAN 5 MG PO TABS
5.0000 mg | ORAL_TABLET | Freq: Two times a day (BID) | ORAL | 0 refills | Status: DC
Start: 2022-08-20 — End: 2022-10-01

## 2022-08-20 NOTE — Telephone Encounter (Signed)
*  STAT* If patient is at the pharmacy, call can be transferred to refill team.   1. Which medications need to be refilled? (please list name of each medication and dose if known)   ELIQUIS 5 MG TABS tablet    2. Which pharmacy/location (including street and city if local pharmacy) is medication to be sent to? [facility] - [address], [place] - [address]    3. Do they need a 30 day or 90 day supply? 90 day

## 2022-08-20 NOTE — Telephone Encounter (Signed)
Prescription refill request for Eliquis received. Indication:PAF Last office visit: 01/07/22 Scr: 1.00 02/05/22 epic Age: 67 Weight: 79kg

## 2022-08-22 ENCOUNTER — Ambulatory Visit (INDEPENDENT_AMBULATORY_CARE_PROVIDER_SITE_OTHER): Payer: Medicare HMO | Admitting: *Deleted

## 2022-08-22 ENCOUNTER — Telehealth: Payer: Medicare HMO

## 2022-08-22 DIAGNOSIS — F02818 Dementia in other diseases classified elsewhere, unspecified severity, with other behavioral disturbance: Secondary | ICD-10-CM

## 2022-08-22 DIAGNOSIS — E782 Mixed hyperlipidemia: Secondary | ICD-10-CM

## 2022-08-22 DIAGNOSIS — I480 Paroxysmal atrial fibrillation: Secondary | ICD-10-CM

## 2022-08-22 NOTE — Patient Instructions (Signed)
Please call the care guide team at [phone] if you need to cancel or reschedule your appointment.   If you are experiencing a Mental Health or Behavioral Health Crisis or need someone to talk to, please call the Suicide and Crisis Lifeline: 988 call the [place] National Suicide Prevention Lifeline: 930-308-5629 or TTY: 707-677-9850 TTY 3073851952) to talk to a trained counselor call 1-800-273-TALK (toll free, 24 hour hotline) go to [facility] [address], [place] [phone]) call the [facility]: [phone] call 911   Following is a copy of the CCM Program Consent:  CCM service includes personalized support from designated clinical staff supervised by the physician, including individualized plan of care and coordination with other care providers 24/7 contact phone numbers for assistance for urgent and routine care needs. Service will only be billed when office clinical staff spend 20 minutes or more in a month to coordinate care. Only one practitioner may furnish and bill the service in a calendar month. The patient may stop CCM services at amy time (effective at the end of the month) by phone call to the office staff. The patient will be responsible for cost sharing (co-pay) or up to 20% of the service fee (after annual deductible is met)  Following is a copy of your full provider care plan:   Goals Addressed             This Visit's Progress    CCM (ATRIAL FIBRILLATION) EXPECTED OUTCOME: MONITOR, SELF-MANAGE AND REDUCE SYMPTOMS OF ATRIAL FIBRILLATION       Current Barriers:  Knowledge Deficits related to Atrial Fibrillation management Chronic Disease Management support and education needs related to Atrial Fibrillation Cognitive Deficits No Advanced Directives in place- documents previously mailed  Planned Interventions: Counseled on increased risk of stroke due to Afib and benefits of anticoagulation for  stroke prevention           Reviewed importance of adherence to anticoagulant exactly as prescribed Counseled on importance of regular laboratory monitoring as prescribed Reviewed Atrial Fibrillation  Symptom Management: Take medications as prescribed   Attend all scheduled provider appointments Call pharmacy for medication refills 3-7 days in advance of running out of medications Call provider office for new concerns or questions  - begin a symptom diary - bring symptom diary to all appointments - check pulse (heart) rate once a day - make a plan to exercise regularly - make a plan to eat healthy - keep all lab appointments - take medicine as prescribed - wear medical alert identification Follow Atrial Fibrillation action plan, call your doctor early on for change in health status or symptoms  Follow Up Plan: Telephone follow up appointment with care management team member scheduled for:  10/22/22 at 215 pm       CCM (DEMENTIA) EXPECTED OUTCOME: MONITOR, SELF-MANAGE AND REDUCE SYMPTOMS OF DEMENTIA       Current Barriers:  Knowledge Deficits related to Alzheimer's dementia management, resources Care Coordination needs related to caregiver, dementia resources in a patient with Alzheimer's dementia Chronic Disease Management support and education needs related to Alzheimer's dementia Cognitive Deficits No Advanced Directives in place- documents previously mailed Spoke with patient and spouse who report pt lives with spouse and adult son, pt is usually independent with ADL's and continues to drive but not alone, spouse states pt is forgetful most of the time and this is getting worse, pt recognizes family members, has agitation, verbal outbursts and anger that is getting worse, pt  is to see primary care provider on 08/26/22 to discuss medication for agitation, social worker continues to work with patient  Planned Interventions: Reviewed medications including importance of taking as  prescribed, Depression screen completed, Emotional Support Provided to patient/caregiver, Discussed Health Care Power of Attorney , Discussed importance of discussing diagnosis with provider, Discussed importance of attendance to all provider appointments, and Advised to contact provider for new or worsening symptoms Reinforced tips for everyday care of patients with dementia: keep a routine, such as bathing dressing eating at the same time each day, plan activities that the person enjoys and try to do them at the same time each day, serve meals in a consistent, familiar place and give patient enough time to eat, encourage use of loose-fitting, comfortable, easy to use clothing such as, elastic waistbands, fabric fasteners, or large zipper pulls instead of shoe laces, buttons or buckles Reviewed upcoming scheduled appointments  Symptom Management: Take medications as prescribed   Attend all scheduled provider appointments Call pharmacy for medication refills 3-7 days in advance of running out of medications Call provider office for new concerns or questions  Tips for everyday care of patients with dementia: keep a routine, such as bathing dressing eating at the same time each day, plan activities that the person enjoys and try to do them at the same time each day, serve meals in a consistent, familiar place and give patient enough time to eat, encourage use of loose-fitting, comfortable, easy to use clothing such as, elastic waistbands, fabric fasteners, or large zipper pulls instead of shoelaces, buttons, or buckles. Social worker will outreach you on 08/25/22 at 215 pm  Follow Up Plan: Telephone follow up appointment with care management team member scheduled for:  10/22/22 at 215 pm       CCM (HYPERLIPIDEMIA) EXPECTED OUTCOME: MONITOR, SELF-MANAGE AND REDUCE SYMPTOMS OF HYPERLIPIDEMIA       Current Barriers:  Knowledge Deficits related to Hyperlipidemia management Chronic Disease Management  support and education needs related to Hyperlipidemia, diet Cognitive Deficits No Advanced Directives in place-documents previously mailed Patient reports he does not do a lot of exercise  Planned Interventions: Provider established cholesterol goals reviewed; Counseled on importance of regular laboratory monitoring as prescribed; Reviewed importance of limiting foods high in cholesterol; Reviewed exercise goals and target of 150 minutes per week; Reinforced heart healthy diet  Symptom Management: Take medications as prescribed   Attend all scheduled provider appointments Call pharmacy for medication refills 3-7 days in advance of running out of medications Attend church or other social activities Call provider office for new concerns or questions  - call doctor with any symptoms you believe are related to your medicine - call doctor when you experience any new symptoms - go to all doctor appointments as scheduled - adhere to prescribed diet: heart healthy - develop an exercise routine Follow heart healthy - bake or broil food instead of frying  Follow Up Plan: Telephone follow up appointment with care management team member scheduled for:  10/22/22 at 215 pm          Patient verbalizes understanding of instructions and care plan provided today and agrees to view in MyChart. Active MyChart status and patient understanding of how to access instructions and care plan via MyChart confirmed with patient.  Telephone follow up appointment with care management team member scheduled for:  10/22/22 at 215 pm  Living With Alzheimer's Disease Alzheimer's disease is a brain disease that makes you forget things that you used to  know. It also makes it hard to pay attention, remember information you just learned, communicate, and do routine tasks. Alzheimer's disease gets worse over time. At the start of the disease, you may be able to take care of yourself, but you will eventually need someone  to help care for you. There are actions you can take to help manage your life while living with this condition. How to manage lifestyle changes Managing emotions and stress It is normal to have many emotions about this condition, such as fear, sadness, anger, and loss. Here are some ways to help yourself manage these emotions: Accept your emotions. Write down your thoughts and feelings in a journal. Build a supportive group of friends and family to help you. Join a support group for people with Alzheimer's disease. Find healthy ways to manage your stress. Try any of the following: Spending time with others. Doing meditation and deep breathing exercises. Talking about how you are feeling. Listening to music. Doing creative artwork. Thinking about what is most stressful for you. Find ways to change or avoid these things if possible. Ask for help from others to help relieve the stress. Adjusting to changes The changes caused by Alzheimer's disease can be frightening and confusing. Here are some things you can do to make adjusting to these changes a little easier: Keep a daily routine. Keep a calendar in a central location. Write down your appointments and activities on the calendar. Keep a list of things you need to do. Focus on one task at a time. Organize medicines in a pillbox for each day of the week. Accept that some things may need to change for your safety, such as driving. Accept help from others. Do not be ashamed if you need help with certain tasks. Create a plan for any legal or financial actions that are needed. Get professional advice if you are not sure what should be done.  How to recognize stress It is normal to feel stressed from time to time. Here are some signs that you are feeling stressed: Avoiding contact with other people. Irritability, anger, or frustration. Denying that you have the disease. Trouble sleeping. Trouble  concentrating. Anxiety. Depression. Developing other health problems. Follow these instructions at home:  Create a bedtime and sleeping routine that includes: Sleeping in a cool bedroom. Using darkening shades on bedroom windows. No physical activity or eating for a few hours before bedtime. Get regular exercise. Get exposure to daytime sunlight. Use safety devices, such as a cane or walker, if you have trouble with balance. Have a safety plan for emergencies. Consider using a safety alert system that allows you to get help quickly. Avoid caffeine and alcohol. Take over-the-counter and prescription medicines only as told by your health care provider. Where to find support Friends and family. Your place of worship. Counselors or therapists. Support groups. Home health care services. Where to find more information Alzheimer's Association: LimitLaws[employer]hu Contact a health care provider if: You are unable to care for yourself. You feel that you are in danger. Get help right away if: You have thoughts of harming yourself. You feel depressed. If you ever feel like you may hurt yourself or others, or have thoughts about taking your own life, get help right away. Go to your nearest emergency department or: Call your local emergency services (911 in the [place] Call a suicide crisis helpline, such as the National Suicide Prevention Lifeline at (209) 835-8298 or 988 in the [place] This is open 24 hours a day  in the [place] Text the Crisis Text Line at 314-377-3666 (in the [place] Summary Alzheimer's disease is a brain disease that makes you forget things that you used to know. It also makes it hard to pay attention, remember new information just learned, communicate, and do routine tasks. Alzheimer's disease gets worse over time. At the start of the disease, you may be able to take care of yourself, but you will eventually need someone to help care for you. The changes caused by Alzheimer's disease can be  frightening and confusing. You may need to make changes in your daily routine. Build a supportive group of friends and family to help you. This information is not intended to replace advice given to you by your health care provider. Make sure you discuss any questions you have with your health care provider. Document Revised: 08/22/2020 Document Reviewed: 06/13/2019 Elsevier Patient Education  2024 [employer].

## 2022-08-22 NOTE — Chronic Care Management (AMB) (Signed)
Chronic Care Management   CCM RN Visit Note  08/22/2022 Name: Jonathan Poole MRN: [id number] DOB: [birth year]  Subjective: Jonathan Poole is a 67 y.o. year old male who is a primary care patient of Anabel Halon, MD. The patient was referred to the Chronic Care Management team for assistance with care management needs subsequent to provider initiation of CCM services and plan of care.    Today's Visit:  Engaged with patient by telephone for follow up visit.        Goals Addressed             This Visit's Progress    CCM (ATRIAL FIBRILLATION) EXPECTED OUTCOME: MONITOR, SELF-MANAGE AND REDUCE SYMPTOMS OF ATRIAL FIBRILLATION       Current Barriers:  Knowledge Deficits related to Atrial Fibrillation management Chronic Disease Management support and education needs related to Atrial Fibrillation Cognitive Deficits No Advanced Directives in place- documents previously mailed  Planned Interventions: Counseled on increased risk of stroke due to Afib and benefits of anticoagulation for stroke prevention           Reviewed importance of adherence to anticoagulant exactly as prescribed Counseled on importance of regular laboratory monitoring as prescribed Reviewed Atrial Fibrillation  Symptom Management: Take medications as prescribed   Attend all scheduled provider appointments Call pharmacy for medication refills 3-7 days in advance of running out of medications Call provider office for new concerns or questions  - begin a symptom diary - bring symptom diary to all appointments - check pulse (heart) rate once a day - make a plan to exercise regularly - make a plan to eat healthy - keep all lab appointments - take medicine as prescribed - wear medical alert identification Follow Atrial Fibrillation action plan, call your doctor early on for change in health status or symptoms  Follow Up Plan: Telephone follow up appointment with care management team member scheduled  for:  10/22/22 at 215 pm       CCM (DEMENTIA) EXPECTED OUTCOME: MONITOR, SELF-MANAGE AND REDUCE SYMPTOMS OF DEMENTIA       Current Barriers:  Knowledge Deficits related to Alzheimer's dementia management, resources Care Coordination needs related to caregiver, dementia resources in a patient with Alzheimer's dementia Chronic Disease Management support and education needs related to Alzheimer's dementia Cognitive Deficits No Advanced Directives in place- documents previously mailed Spoke with patient and spouse who report pt lives with spouse and adult son, pt is usually independent with ADL's and continues to drive but not alone, spouse states pt is forgetful most of the time and this is getting worse, pt recognizes family members, has agitation, verbal outbursts and anger that is getting worse, pt is to see primary care provider on 08/26/22 to discuss medication for agitation, social worker continues to work with patient  Planned Interventions: Reviewed medications including importance of taking as prescribed, Depression screen completed, Emotional Support Provided to patient/caregiver, Discussed Health Care Power of [place] , Discussed importance of discussing diagnosis with provider, Discussed importance of attendance to all provider appointments, and Advised to contact provider for new or worsening symptoms Reinforced tips for everyday care of patients with dementia: keep a routine, such as bathing dressing eating at the same time each day, plan activities that the person enjoys and try to do them at the same time each day, serve meals in a consistent, familiar place and give patient enough time to eat, encourage use of loose-fitting, comfortable, easy to use clothing such as, elastic waistbands, fabric fasteners,  or large zipper pulls instead of shoe laces, buttons or buckles Reviewed upcoming scheduled appointments  Symptom Management: Take medications as prescribed   Attend all scheduled  provider appointments Call pharmacy for medication refills 3-7 days in advance of running out of medications Call provider office for new concerns or questions  Tips for everyday care of patients with dementia: keep a routine, such as bathing dressing eating at the same time each day, plan activities that the person enjoys and try to do them at the same time each day, serve meals in a consistent, familiar place and give patient enough time to eat, encourage use of loose-fitting, comfortable, easy to use clothing such as, elastic waistbands, fabric fasteners, or large zipper pulls instead of shoelaces, buttons, or buckles. Social worker will outreach you on 08/25/22 at 215 pm  Follow Up Plan: Telephone follow up appointment with care management team member scheduled for:  10/22/22 at 215 pm       CCM (HYPERLIPIDEMIA) EXPECTED OUTCOME: MONITOR, SELF-MANAGE AND REDUCE SYMPTOMS OF HYPERLIPIDEMIA       Current Barriers:  Knowledge Deficits related to Hyperlipidemia management Chronic Disease Management support and education needs related to Hyperlipidemia, diet Cognitive Deficits No Advanced Directives in place-documents previously mailed Patient reports he does not do a lot of exercise  Planned Interventions: Provider established cholesterol goals reviewed; Counseled on importance of regular laboratory monitoring as prescribed; Reviewed importance of limiting foods high in cholesterol; Reviewed exercise goals and target of 150 minutes per week; Reinforced heart healthy diet  Symptom Management: Take medications as prescribed   Attend all scheduled provider appointments Call pharmacy for medication refills 3-7 days in advance of running out of medications Attend church or other social activities Call provider office for new concerns or questions  - call doctor with any symptoms you believe are related to your medicine - call doctor when you experience any new symptoms - go to all doctor  appointments as scheduled - adhere to prescribed diet: heart healthy - develop an exercise routine Follow heart healthy - bake or broil food instead of frying  Follow Up Plan: Telephone follow up appointment with care management team member scheduled for:  10/22/22 at 215 pm          Plan:Telephone follow up appointment with care management team member scheduled for:  10/22/22 at 215 pm  Irving Shows [facility], BSN RN Case Manager [facility] [phone]

## 2022-08-25 ENCOUNTER — Ambulatory Visit: Payer: Self-pay | Admitting: *Deleted

## 2022-08-26 ENCOUNTER — Ambulatory Visit: Payer: Medicare HMO | Admitting: Internal Medicine

## 2022-08-26 NOTE — Patient Outreach (Signed)
  Care Coordination   08/26/2022  Name: Jonathan Poole MRN: [id number] DOB: 05-10-1955   Care Coordination Outreach Attempts:  An unsuccessful telephone outreach was attempted today to offer the patient information about available care coordination services. HIPAA compliant messages left on voicemail for both patient and wife, providing contact information for CSW, encouraging them to return CSW's call at their earliest convenience.  Follow Up Plan:  Additional outreach attempts will be made to offer the patient care coordination information and services.   Encounter Outcome:  No Answer.   Care Coordination Interventions:  No, not indicated.    Danford Bad, BSW, MSW, LCSW  Licensed Restaurant manager, [facility] [employer] [facility]  Mailing [place] [address] [place], [place] [address] Physical Address[address], [place], [place] [address] Toll Free Main # [phone] Fax # [phone] Cell # [phone] [email or web address]

## 2022-09-08 ENCOUNTER — Ambulatory Visit: Payer: Self-pay | Admitting: *Deleted

## 2022-09-08 NOTE — Patient Outreach (Signed)
  Care Coordination   09/08/2022  Name: Jonathan Poole MRN: [id number] DOB: [birth year]   Care Coordination Outreach Attempts:  An unsuccessful telephone outreach was attempted today to offer the patient information about available care coordination services. HIPAA compliant messages left on voicemail for patient and wife, providing contact information for CSW, encouraging them to return CSW's call at their earliest convenience.  Follow Up Plan:  Additional outreach attempts will be made to offer the patient care coordination information and services.   Encounter Outcome:  No Answer.   Care Coordination Interventions:  No, not indicated.    Danford Bad, BSW, MSW, LCSW  Licensed Restaurant manager, fast food [facility]  Mailing [place] [address] [place], [place] [address] Physical Address[address] [place], [place] [address] Toll Free Main # [phone] Fax # [phone] Cell # [phone] [email or web address]

## 2022-09-10 DIAGNOSIS — F0280 Dementia in other diseases classified elsewhere without behavioral disturbance: Secondary | ICD-10-CM

## 2022-09-10 DIAGNOSIS — E785 Hyperlipidemia, unspecified: Secondary | ICD-10-CM | POA: Diagnosis not present

## 2022-09-10 DIAGNOSIS — I4891 Unspecified atrial fibrillation: Secondary | ICD-10-CM

## 2022-09-11 ENCOUNTER — Ambulatory Visit: Payer: Self-pay | Admitting: *Deleted

## 2022-09-11 ENCOUNTER — Encounter: Payer: Self-pay | Admitting: *Deleted
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
## 2022-09-18 ENCOUNTER — Ambulatory Visit: Payer: Medicare HMO | Admitting: Internal Medicine

## 2022-09-19 ENCOUNTER — Ambulatory Visit (HOSPITAL_COMMUNITY): Payer: Medicare HMO | Attending: Physician Assistant

## 2022-09-22 DIAGNOSIS — M546 Pain in thoracic spine: Secondary | ICD-10-CM | POA: Diagnosis not present

## 2022-09-22 DIAGNOSIS — Z79891 Long term (current) use of opiate analgesic: Secondary | ICD-10-CM | POA: Diagnosis not present

## 2022-09-22 DIAGNOSIS — Z79899 Other long term (current) drug therapy: Secondary | ICD-10-CM | POA: Diagnosis not present

## 2022-09-22 DIAGNOSIS — G894 Chronic pain syndrome: Secondary | ICD-10-CM | POA: Diagnosis not present

## 2022-09-22 DIAGNOSIS — M542 Cervicalgia: Secondary | ICD-10-CM | POA: Diagnosis not present

## 2022-09-22 DIAGNOSIS — M5116 Intervertebral disc disorders with radiculopathy, lumbar region: Secondary | ICD-10-CM | POA: Diagnosis not present

## 2022-09-22 NOTE — Patient Outreach (Signed)
  Care Coordination   Documentation  Note   09/22/2022 Name: Jonathan Poole MRN: [id number] DOB: December 09, 1955  Jonathan Poole is a 67 y.o. year old male who sees Anabel Halon, MD for primary care. I  performed chart review in anticipation for today's scheduled follow up with the patient and his spouse. Outbound call to spouse was unsuccessful. BSW noted patients active engagement with LCSW, collaboration with LCSW to notify of BSW plan to sign off at this time.  What matters to the patients health and wellness today?  SW did not speak with the patient today.    Goals Addressed             This Visit's Progress    COMPLETED: Care Coordination Activities       Care Coordination Interventions: Performed chart review to note patients spouse is actively engaged with LCSW to focus on care coordination goals. Collaboration with LCSW [employer] advising of BSW plan to sign off at this time        SDOH assessments and interventions completed:  No     Care Coordination Interventions:  Yes, provided   Interventions Today    Flowsheet Row Most Recent Value  Chronic Disease   Chronic disease during today's visit Other  [Alzheimer's Dementia]  General Interventions   General Interventions Discussed/Reviewed Communication with  Communication with Social Work  [Communication with LCSW [employer] of BSW plan to sign off as patient and spouse engaged with LCSW for care coordination needs]        Follow up plan: No further intervention required. The patient will remain engaged with LCSW to address care coordination needs.    Encounter Outcome:  Pt. Visit Completed   Bevelyn Ngo, BSW, CDP Social Worker, Certified Dementia Practitioner [facility] Care Management  Care Coordination [phone]

## 2022-09-30 ENCOUNTER — Telehealth: Payer: Self-pay | Admitting: Internal Medicine

## 2022-09-30 ENCOUNTER — Ambulatory Visit (INDEPENDENT_AMBULATORY_CARE_PROVIDER_SITE_OTHER): Payer: Medicare HMO

## 2022-09-30 ENCOUNTER — Encounter: Payer: Self-pay | Admitting: *Deleted

## 2022-09-30 DIAGNOSIS — I480 Paroxysmal atrial fibrillation: Secondary | ICD-10-CM

## 2022-09-30 LAB — CUP PACEART REMOTE DEVICE CHECK
Battery Remaining Longevity: 88 mo
Battery Remaining Percentage: 75 %
Battery Voltage: 3.01 V
Brady Statistic AP VP Percent: 1.8 %
Brady Statistic AP VS Percent: 96 %
Brady Statistic AS VP Percent: 1 %
Brady Statistic AS VS Percent: 2.2 %
Brady Statistic RA Percent Paced: 97 %
Brady Statistic RV Percent Paced: 1.8 %
Date Time Interrogation Session: 20240820040012
Implantable Lead Connection Status: 753985
Implantable Lead Connection Status: 753985
Implantable Lead Implant Date: 20100504
Implantable Lead Implant Date: 20100524
Implantable Lead Location: 753859
Implantable Lead Location: 753860
Implantable Pulse Generator Implant Date: 20211122
Lead Channel Impedance Value: 450 Ohm
Lead Channel Impedance Value: 510 Ohm
Lead Channel Pacing Threshold Amplitude: 0.75 V
Lead Channel Pacing Threshold Amplitude: 1.25 V
Lead Channel Pacing Threshold Pulse Width: 0.3 ms
Lead Channel Pacing Threshold Pulse Width: 0.7 ms
Lead Channel Sensing Intrinsic Amplitude: 4.6 mV
Lead Channel Sensing Intrinsic Amplitude: 5 mV
Lead Channel Setting Pacing Amplitude: 1.5 V
Lead Channel Setting Pacing Amplitude: 1.5 V
Lead Channel Setting Pacing Pulse Width: 0.7 ms
Lead Channel Setting Sensing Sensitivity: 0.7 mV
Pulse Gen Model: 2272
Pulse Gen Serial Number: 3864825

## 2022-09-30 NOTE — Telephone Encounter (Signed)
Patient calling the office for samples of medication:   1.  What medication and dosage are you requesting samples for? apixaban (ELIQUIS) 5 MG TABS tablet  2.  Are you currently out of this medication? Yes    

## 2022-10-01 MED ORDER — APIXABAN 5 MG PO TABS
5.0000 mg | ORAL_TABLET | Freq: Two times a day (BID) | ORAL | 0 refills | Status: DC
Start: 2022-10-01 — End: 2022-11-28

## 2022-10-01 NOTE — Telephone Encounter (Signed)
Pt requesting Eliquis samples:  Indication: PAF Last office visit: 01/07/22  [name] MD Scr: 1.00 on 02/05/22  Epic Age: 67 Weight: 78.9kg  Based on above findings Eliquis 5mg  twice daily is the appropriate dose.  OK to provide samples if available.

## 2022-10-01 NOTE — Telephone Encounter (Signed)
Called to notify that samples are ready. No answer left msg to call back.

## 2022-10-07 ENCOUNTER — Ambulatory Visit: Payer: Medicare HMO | Admitting: Internal Medicine

## 2022-10-10 NOTE — Progress Notes (Signed)
Remote pacemaker transmission.   

## 2022-10-14 ENCOUNTER — Encounter: Payer: Self-pay | Admitting: *Deleted

## 2022-10-14 ENCOUNTER — Ambulatory Visit: Payer: Self-pay | Admitting: *Deleted
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
## 2022-10-16 ENCOUNTER — Telehealth: Payer: Self-pay | Admitting: Internal Medicine

## 2022-10-16 MED ORDER — APIXABAN 5 MG PO TABS: 5.0000 mg | ORAL_TABLET | Freq: Two times a day (BID) | ORAL | 0 refills | Status: DC

## 2022-10-16 NOTE — Telephone Encounter (Signed)
Sample request for Eliquis received. Indication: PAF Last office visit: 01/07/22  [name] MD Scr: 1.00 on 02/05/22  Epic Age: 67 Weight: 78.9kg   Based on above findings Eliquis 5mg  twice daily is the appropriate dose.  OK to provide samples if available.

## 2022-10-16 NOTE — Telephone Encounter (Signed)
Patient calling the office for samples of medication:   1.  What medication and dosage are you requesting samples for? apixaban (ELIQUIS) 5 MG TABS tablet  2.  Are you currently out of this medication? Yes    

## 2022-10-16 NOTE — Telephone Encounter (Signed)
Pt c/o dizziness and presyncopal episodes daily. Pt also c/o SOB. Pts wife stated that pt spends a lot of time laying around. Pts wife stated that pt has alzheimer's/dementia. Pt wife is questioning if pt should be seen sooner than December.   Please advise.

## 2022-10-16 NOTE — Telephone Encounter (Signed)
Eliquis 5 mg tablets- Sample at the front desk waiting for pickup.   Pt is returning PAF forms.

## 2022-10-22 ENCOUNTER — Encounter: Payer: Self-pay | Admitting: *Deleted

## 2022-10-22 ENCOUNTER — Other Ambulatory Visit: Payer: Medicare HMO | Admitting: *Deleted

## 2022-10-22 NOTE — Patient Instructions (Signed)
Visit Information  Thank you for taking time to visit with me today. Please don't hesitate to contact me if I can be of assistance to you before our next scheduled telephone appointment.  Following are the goals we discussed today:   Goals Addressed             This Visit's Progress    CCM (ATRIAL FIBRILLATION) EXPECTED OUTCOME: MONITOR, SELF-MANAGE AND REDUCE SYMPTOMS OF ATRIAL FIBRILLATION       Current Barriers:  Knowledge Deficits related to Atrial Fibrillation management Chronic Disease Management support and education needs related to Atrial Fibrillation Cognitive Deficits No Advanced Directives in place- documents previously mailed  Planned Interventions: Counseled on increased risk of stroke due to Afib and benefits of anticoagulation for stroke prevention           Reviewed importance of adherence to anticoagulant exactly as prescribed Counseled on importance of regular laboratory monitoring as prescribed Reinforced Atrial Fibrillation Reviewed upcoming cardiology appointment  Symptom Management: Take medications as prescribed   Attend all scheduled provider appointments Call pharmacy for medication refills 3-7 days in advance of running out of medications Call provider office for new concerns or questions  - begin a symptom diary - bring symptom diary to all appointments - check pulse (heart) rate once a day - make a plan to exercise regularly - make a plan to eat healthy - keep all lab appointments - take medicine as prescribed - wear medical alert identification Follow Atrial Fibrillation action plan, call your doctor early on for change in health status or symptoms Continue following up with cardiologist  Follow Up Plan: Telephone follow up appointment with care management team member scheduled for:  01/05/23 at 215 pm       CCM (DEMENTIA) EXPECTED OUTCOME: MONITOR, SELF-MANAGE AND REDUCE SYMPTOMS OF DEMENTIA       Current Barriers:  Knowledge Deficits  related to Alzheimer's dementia management, resources Care Coordination needs related to caregiver, dementia resources in a patient with Alzheimer's dementia Chronic Disease Management support and education needs related to Alzheimer's dementia Cognitive Deficits No Advanced Directives in place- documents previously mailed Spoke with patient and spouse who report pt lives with spouse and adult son, pt is usually independent with ADL's and continues to drive but not alone, spouse states pt is forgetful most of the time and this is getting worse, pt recognizes family members, has agitation, verbal outbursts and anger that is getting worse, pt was supposed to see primary care provider on 08/26/22 to discuss medication for agitation and did not go, has not rescheduled but plans to, social worker continues to work with patient and spouse states " this has been very helpful"  Planned Interventions: Reviewed medications including importance of taking as prescribed, Depression screen completed, Emotional Support Provided to patient/caregiver, Discussed Health Care Power of Attorney , Discussed importance of discussing diagnosis with provider, Discussed importance of attendance to all provider appointments, and Advised to contact provider for new or worsening symptoms Reviewed tips for everyday care of patients with dementia: keep a routine, such as bathing dressing eating at the same time each day, plan activities that the person enjoys and try to do them at the same time each day, serve meals in a consistent, familiar place and give patient enough time to eat, encourage use of loose-fitting, comfortable, easy to use clothing such as, elastic waistbands, fabric fasteners, or large zipper pulls instead of shoe laces, buttons or buckles Reviewed all upcoming scheduled appointments  Symptom Management:  Take medications as prescribed   Attend all scheduled provider appointments Call pharmacy for medication refills  3-7 days in advance of running out of medications Call provider office for new concerns or questions  Tips for everyday care of patients with dementia: keep a routine, such as bathing dressing eating at the same time each day, plan activities that the person enjoys and try to do them at the same time each day, serve meals in a consistent, familiar place and give patient enough time to eat, encourage use of loose-fitting, comfortable, easy to use clothing such as, elastic waistbands, fabric fasteners, or large zipper pulls instead of shoelaces, buttons, or buckles. Social worker will outreach you in September  Follow Up Plan: Telephone follow up appointment with care management team member scheduled for:  01/05/23 at 215 pm       CCM (HYPERLIPIDEMIA) EXPECTED OUTCOME: MONITOR, SELF-MANAGE AND REDUCE SYMPTOMS OF HYPERLIPIDEMIA       Current Barriers:  Knowledge Deficits related to Hyperlipidemia management Chronic Disease Management support and education needs related to Hyperlipidemia, diet Cognitive Deficits No Advanced Directives in place-documents previously mailed Patient reports he does not do a lot of exercise  Planned Interventions: Provider established cholesterol goals reviewed; Counseled on importance of regular laboratory monitoring as prescribed; Reviewed importance of limiting foods high in cholesterol; Reviewed exercise goals and target of 150 minutes per week; Reviewed heart healthy diet  Symptom Management: Take medications as prescribed   Attend all scheduled provider appointments Call pharmacy for medication refills 3-7 days in advance of running out of medications Attend church or other social activities Call provider office for new concerns or questions  - call doctor with any symptoms you believe are related to your medicine - call doctor when you experience any new symptoms - go to all doctor appointments as scheduled - adhere to prescribed diet: heart healthy -  develop an exercise routine Follow heart healthy - bake or broil food instead of frying Limit fast food  Follow Up Plan: Telephone follow up appointment with care management team member scheduled for:  01/05/23 at 215 pm           Our next appointment is by telephone on 01/05/23 at 215 pm  Please call the care guide team at [phone] if you need to cancel or reschedule your appointment.   If you are experiencing a Mental Health or Behavioral Health Crisis or need someone to talk to, please call the Suicide and Crisis Lifeline: 988 call the [place] National Suicide Prevention Lifeline: (303)376-7336 or TTY: 615 734 2888 TTY (701)743-1663) to talk to a trained counselor call 1-800-273-TALK (toll free, 24 hour hotline) go to [facility] [address] [place] [phone]) call 911   Patient verbalizes understanding of instructions and care plan provided today and agrees to view in MyChart. Active MyChart status and patient understanding of how to access instructions and care plan via MyChart confirmed with patient.     Telephone follow up appointment with care management team member scheduled for: 01/05/23 at 215 pm  Irving Shows [facility], BSN [place]/ Ambulatory Care Management [phone]

## 2022-10-22 NOTE — Patient Outreach (Signed)
Care Management   Visit Note  10/22/2022 Name: Jonathan Poole MRN: [id number] DOB: [birth year]  Subjective: Jonathan Poole is a 67 y.o. year old male who is a primary care patient of Jonathan Halon, MD. The Care Management team was consulted for assistance.      Engaged with patient spoke with patient by telephone for follow up   Goals Addressed             This Visit's Progress    CCM (ATRIAL FIBRILLATION) EXPECTED OUTCOME: MONITOR, SELF-MANAGE AND REDUCE SYMPTOMS OF ATRIAL FIBRILLATION       Current Barriers:  Knowledge Deficits related to Atrial Fibrillation management Chronic Disease Management support and education needs related to Atrial Fibrillation Cognitive Deficits No Advanced Directives in place- documents previously mailed  Planned Interventions: Counseled on increased risk of stroke due to Afib and benefits of anticoagulation for stroke prevention           Reviewed importance of adherence to anticoagulant exactly as prescribed Counseled on importance of regular laboratory monitoring as prescribed Reinforced Atrial Fibrillation Reviewed upcoming cardiology appointment  Symptom Management: Take medications as prescribed   Attend all scheduled provider appointments Call pharmacy for medication refills 3-7 days in advance of running out of medications Call provider office for new concerns or questions  - begin a symptom diary - bring symptom diary to all appointments - check pulse (heart) rate once a day - make a plan to exercise regularly - make a plan to eat healthy - keep all lab appointments - take medicine as prescribed - wear medical alert identification Follow Atrial Fibrillation action plan, call your doctor early on for change in health status or symptoms Continue following up with cardiologist  Follow Up Plan: Telephone follow up appointment with care management team member scheduled for:  01/05/23 at 215 pm       CCM (DEMENTIA) EXPECTED  OUTCOME: MONITOR, SELF-MANAGE AND REDUCE SYMPTOMS OF DEMENTIA       Current Barriers:  Knowledge Deficits related to Alzheimer's dementia management, resources Care Coordination needs related to caregiver, dementia resources in a patient with Alzheimer's dementia Chronic Disease Management support and education needs related to Alzheimer's dementia Cognitive Deficits No Advanced Directives in place- documents previously mailed Spoke with patient and spouse who report pt lives with spouse and adult son, pt is usually independent with ADL's and continues to drive but not alone, spouse states pt is forgetful most of the time and this is getting worse, pt recognizes family members, has agitation, verbal outbursts and anger that is getting worse, pt was supposed to see primary care provider on 08/26/22 to discuss medication for agitation and did not go, has not rescheduled but plans to, social worker continues to work with patient and spouse states " this has been very helpful"  Planned Interventions: Reviewed medications including importance of taking as prescribed, Depression screen completed, Emotional Support Provided to patient/caregiver, Discussed Health Care Power of Attorney , Discussed importance of discussing diagnosis with provider, Discussed importance of attendance to all provider appointments, and Advised to contact provider for new or worsening symptoms Reviewed tips for everyday care of patients with dementia: keep a routine, such as bathing dressing eating at the same time each day, plan activities that the person enjoys and try to do them at the same time each day, serve meals in a consistent, familiar place and give patient enough time to eat, encourage use of loose-fitting, comfortable, easy to use clothing such as, elastic  waistbands, fabric fasteners, or large zipper pulls instead of shoe laces, buttons or buckles Reviewed all upcoming scheduled appointments  Symptom Management: Take  medications as prescribed   Attend all scheduled provider appointments Call pharmacy for medication refills 3-7 days in advance of running out of medications Call provider office for new concerns or questions  Tips for everyday care of patients with dementia: keep a routine, such as bathing dressing eating at the same time each day, plan activities that the person enjoys and try to do them at the same time each day, serve meals in a consistent, familiar place and give patient enough time to eat, encourage use of loose-fitting, comfortable, easy to use clothing such as, elastic waistbands, fabric fasteners, or large zipper pulls instead of shoelaces, buttons, or buckles. Social worker will outreach you in September  Follow Up Plan: Telephone follow up appointment with care management team member scheduled for:  01/05/23 at 215 pm       CCM (HYPERLIPIDEMIA) EXPECTED OUTCOME: MONITOR, SELF-MANAGE AND REDUCE SYMPTOMS OF HYPERLIPIDEMIA       Current Barriers:  Knowledge Deficits related to Hyperlipidemia management Chronic Disease Management support and education needs related to Hyperlipidemia, diet Cognitive Deficits No Advanced Directives in place-documents previously mailed Patient reports he does not do a lot of exercise  Planned Interventions: Provider established cholesterol goals reviewed; Counseled on importance of regular laboratory monitoring as prescribed; Reviewed importance of limiting foods high in cholesterol; Reviewed exercise goals and target of 150 minutes per week; Reviewed heart healthy diet  Symptom Management: Take medications as prescribed   Attend all scheduled provider appointments Call pharmacy for medication refills 3-7 days in advance of running out of medications Attend church or other social activities Call provider office for new concerns or questions  - call doctor with any symptoms you believe are related to your medicine - call doctor when you experience  any new symptoms - go to all doctor appointments as scheduled - adhere to prescribed diet: heart healthy - develop an exercise routine Follow heart healthy - bake or broil food instead of frying Limit fast food  Follow Up Plan: Telephone follow up appointment with care management team member scheduled for:  01/05/23 at 215 pm           Plan: Telephone follow up appointment with care management team member scheduled for: 01/05/23 at 215 pm  Jonathan Poole [facility], BSN [place]/ Ambulatory Care Management [phone]

## 2022-10-27 DIAGNOSIS — M5116 Intervertebral disc disorders with radiculopathy, lumbar region: Secondary | ICD-10-CM | POA: Diagnosis not present

## 2022-10-27 DIAGNOSIS — G894 Chronic pain syndrome: Secondary | ICD-10-CM | POA: Diagnosis not present

## 2022-10-27 DIAGNOSIS — Z79891 Long term (current) use of opiate analgesic: Secondary | ICD-10-CM | POA: Diagnosis not present

## 2022-10-27 DIAGNOSIS — M542 Cervicalgia: Secondary | ICD-10-CM | POA: Diagnosis not present

## 2022-10-27 DIAGNOSIS — M546 Pain in thoracic spine: Secondary | ICD-10-CM | POA: Diagnosis not present

## 2022-10-27 DIAGNOSIS — F1120 Opioid dependence, uncomplicated: Secondary | ICD-10-CM | POA: Diagnosis not present

## 2022-10-28 ENCOUNTER — Ambulatory Visit: Payer: Self-pay | Admitting: *Deleted

## 2022-10-28 NOTE — Patient Outreach (Signed)
Care Coordination   Follow Up Visit Note   10/28/2022  Name: Jonathan Poole MRN: [id number] DOB: 1955/06/09  Pearletha Furl Westby is a 67 y.o. year old male who sees Anabel Halon, MD for primary care. I spoke with patient's wife, Emile Mention by phone today.  What matters to the patients health and wellness today?  Obtain Counseling, Supportive Services, & Resources.    Goals Addressed             This Visit's Progress    Obtain Counseling, Supportive Services, & Resources.   On track    Care Coordination Interventions:  Interventions Today    Flowsheet Row Most Recent Value  Chronic Disease   Chronic disease during today's visit Other  [Alzheimer's Dementia with Behavioral Disturbance, Caregiver Burnout & Fatigue, Inability to Perform Activities of Daily Living Independently]  General Interventions   General Interventions Discussed/Reviewed General Interventions Discussed, Labs, Vaccines, Doctor Visits, Referral to Nurse, Communication with, Level of Care, Community Resources, Horticulturist, commercial (DME), Annual Eye Exam, General Interventions Reviewed, Lipid Profile, Health Screening, Annual Foot Exam  [Encouraged]  Labs Hgb A1c every 3 months, Kidney Function  [Encouraged]  Vaccines COVID-19, Tetanus/Pertussis/Diphtheria, RSV, Shingles, Pneumonia, Flu  [Encouraged]  Doctor Visits Discussed/Reviewed Doctor Visits Discussed, Specialist, Annual Wellness Visits, PCP, Doctor Visits Reviewed  [Encouraged]  Health Screening Bone Density, Colonoscopy, Prostate  [Encouraged]  Durable Medical Equipment (DME) Walker  PCP/Specialist Visits Compliance with follow-up visit  [Encouraged]  Communication with PCP/Specialists, RN, Pharmacists  [Encouraged]  Level of Care Skilled Nursing Facility, Assisted Living, Applications, Adult Daycare, Personal Care Services  [Encouraged]  Applications Medicaid, Personal Care Services, [place]  [Encouraged]  Exercise Interventions   Exercise  Discussed/Reviewed Exercise Discussed, Assistive device use and maintanence, Weight Managment, Physical Activity, Exercise Reviewed  [Encouraged]  Physical Activity Discussed/Reviewed Physical Activity Discussed, Home Exercise Program (HEP), PREP, Gym, Types of exercise, Physical Activity Reviewed  [Encouraged]  Weight Management Weight loss  [Encouraged]  Education Interventions   Education Provided Provided Therapist, sports, Provided Web-based Education, Provided Education  [Encouraged]  Provided Engineer, petroleum On Nutrition, Mental Health/Coping with Illness, When to see the doctor, [employer], [employer], Medication, Exercise, Applications, Eye Care, Foot Care  [Encouraged]  Applications Medicaid, Personal Care Services, [place]  [Encouraged]  Mental Health Interventions   Mental Health Discussed/Reviewed Mental Health Discussed, Depression, Anxiety, Grief and Loss, Mental Health Reviewed, Substance Abuse, Suicide, Coping Strategies, Crisis, Other  [Domestic Violence]  Nutrition Interventions   Nutrition Discussed/Reviewed Nutrition Discussed, Nutrition Reviewed, Carbohydrate meal planning, Decreasing sugar intake, Decreasing salt, Portion sizes, Decreasing fats, Increasing proteins, Fluid intake, Adding fruits and vegetables  [Encouraged]  Pharmacy Interventions   Pharmacy Dicussed/Reviewed Pharmacy Topics Discussed, Medications and their functions, Medication Adherence, Affording Medications, Pharmacy Topics Reviewed  [Encouraged]  Medication Adherence --  [N/A]  Safety Interventions   Safety Discussed/Reviewed Safety Discussed, Safety Reviewed, Fall Risk, Home Safety  [Encouraged]  Home Safety Assistive Devices, Need for home safety assessment, Refer for home visit, Refer for community resources  [Encouraged]  Advanced Directive Interventions   Advanced Directives Discussed/Reviewed Advanced Directives Discussed  [Completed]      Active Listening & Reflection Utilized.   Verbalization of Feelings Encouraged.  Emotional Support Provided. Feelings of Anxiety & Anxiousness Validated. Symptoms of Caregiver Stress, Burnout, & Fatigue Acknowledged. Problem Solving Interventions Implemented. Task-Centered Solutions Indicated.   Solution-Focused Strategies Employed. Acceptance & Commitment Therapy Conducted. Cognitive Behavioral Therapy Performed. Client-Centered Therapy Initiated. Encouraged Routine Engagement in Activities of Interest. Encouraged  Increased Level of Activity & Exercise, as Tolerated. Encouraged Administration of Medications, Exactly as Prescribed. Encouraged Daily Implementation of Deep Breathing Exercises, Relaxation Techniques, & Mindfulness Meditation Strategies. Encouraged Completion of Advance Directives (Living Will & Health Care Power of Attorney Documents) & Submission to Dr. Trena Platt, Primary Care Provider with [facility] [phone]), to Scan into Electronic Medical Record in Epic. Encouraged Self-Enrollment with Psychiatrist of Interest in [facility], from List Provided, to Receive Psychotropic Medication Administration & Management, in An Effort to Reduce & Manage Symptoms of Anxiety. Encouraged Self-Enrollment with Therapist of Interest in [facility], from List Provided, to Receive Psychotherapeutic Counseling & Supportive Services, in An Effort to Reduce & Manage Symptoms of Anxiety. Confirmed Receipt & Thoroughly Reviewed the Following List of Alzheimer's/Dementia Support Agencies, Services, & Resources in [place], to [employer] & Entertain Questions: ~ Alzheimer's & Dementia ResourcesAlzheimer's Association ~ Alzheimer's & Dementia ResourcesAlzheimer's.Gov ~ Resources for Alzheimer's & Dementia CaregiversAlzheimer's Foundation of     [place] ~ Resources for Patients & Caregivers of Alzheimer's & Dementia Department of Neurology ~ Resources for Alzheimer's &  Dementia CaregiversNCBrain ~ Alzheimer's & Dementia Capable NCNCDHHS Confirmed Disinterest in Pursuing Higher Level of Care Placement Options in [facility], from List Provided.  Confirmed Disinterest in Pursuing Palliative Care Services in [place], through [facility] [phone]). Encouraged Follow-Up Appointment with Dr. Trena Platt, Primary Care Provider with [facility] [phone]), Offering to Assist Via 3-Way Call with Receptionist. Encouraged Contact with CSW (# [phone]), if You Have Questions, Need Assistance, or If Additional Social Work Needs Are Identified Between Now & Our Next Follow-Up Outreach Call, Scheduled on 11/12/2022 at 9:00 AM. Encouraged Engagement with Irving Shows, Nurse Care Coordinator with [facility] [phone]), for Follow-Up Outreach Call, Scheduled on 01/05/2023 at 2:15 PM. Encouraged Attendance at Follow-Up Appointment with Dr. Lewayne Bunting, Cardiologist with [facility] at [facility] [phone]), Scheduled on 01/28/2023 at 9:30 AM.      SDOH assessments and interventions completed:  Yes.  Care Coordination Interventions:  Yes, provided.   Follow up plan: Follow up call scheduled for 11/12/2022 at 9:00 am.  Encounter Outcome:  Patient Visit Completed.   Danford Bad, BSW, MSW, Printmaker Social Work Case Set designer Health  [facility], Population Health Direct Dial: [phone]  Fax: [phone] Email: [email or web address] Website: [email or web address]
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
## 2022-11-04 ENCOUNTER — Telehealth: Payer: Self-pay | Admitting: Internal Medicine

## 2022-11-04 ENCOUNTER — Ambulatory Visit: Payer: Medicare HMO | Admitting: *Deleted
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
## 2022-11-04 NOTE — Telephone Encounter (Signed)
Spouse called in regard to patients alzheimers.  Patient has gotten extremely worse over the past week.  Patient is looking ad feeling things as if he has never seen them.  Spouse and son are unable to take eyes off patient due to not knowing what patient may do   Has been opening all door and gates to home and leaving them open after already being closed.   Spouse wants a call back to see what she can do to help patient.  No available appt with provider before 10/3 and spouse does not want to wait that long
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
## 2022-11-11 ENCOUNTER — Telehealth: Payer: Self-pay | Admitting: *Deleted

## 2022-11-11 NOTE — Telephone Encounter (Signed)
Pt was denied BMS ( Eliquis) patient assistance d/t not meeting his 3 % out of pocket for 2024. Pt needs to pay an additional $549.21.

## 2022-11-12 ENCOUNTER — Ambulatory Visit: Payer: Self-pay | Admitting: *Deleted

## 2022-11-12 NOTE — Patient Outreach (Signed)
  Care Coordination   11/12/2022  Name: Jonathan Poole MRN: [id number] DOB: [birth year]   Care Coordination Outreach Attempts:  An unsuccessful telephone outreach was attempted today to offer the patient information about available care coordination services. HIPAA compliant messages left on voicemail for patient's wife, Jonathan Poole providing contact information for CSW, encouraging her to return CSW's call at her earliest convenience.  Follow Up Plan:  Additional outreach attempts will be made to offer the patient care coordination information and services.   Encounter Outcome:  No Answer.   Care Coordination Interventions:  No, not indicated.    Danford Bad, BSW, MSW, Printmaker Social Work Case Set designer Health  [facility], Population Health Direct Dial: [phone]  Fax: [phone] Email: [email or web address] Website: [email or web address]

## 2022-11-13 ENCOUNTER — Ambulatory Visit: Payer: Medicare HMO | Admitting: *Deleted

## 2022-11-13 DIAGNOSIS — R252 Cramp and spasm: Secondary | ICD-10-CM | POA: Diagnosis not present

## 2022-11-13 DIAGNOSIS — R413 Other amnesia: Secondary | ICD-10-CM | POA: Diagnosis not present

## 2022-11-13 DIAGNOSIS — E531 Pyridoxine deficiency: Secondary | ICD-10-CM | POA: Diagnosis not present

## 2022-11-13 DIAGNOSIS — R634 Abnormal weight loss: Secondary | ICD-10-CM | POA: Diagnosis not present

## 2022-11-13 DIAGNOSIS — E538 Deficiency of other specified B group vitamins: Secondary | ICD-10-CM | POA: Diagnosis not present

## 2022-11-13 DIAGNOSIS — G300 Alzheimer's disease with early onset: Secondary | ICD-10-CM | POA: Diagnosis not present

## 2022-11-13 DIAGNOSIS — G894 Chronic pain syndrome: Secondary | ICD-10-CM | POA: Diagnosis not present

## 2022-11-13 DIAGNOSIS — M7918 Myalgia, other site: Secondary | ICD-10-CM | POA: Diagnosis not present

## 2022-11-13 DIAGNOSIS — R202 Paresthesia of skin: Secondary | ICD-10-CM | POA: Diagnosis not present

## 2022-11-13 NOTE — Patient Outreach (Signed)
  Care Coordination   Follow Up Visit Note   11/13/2022  Name: Jonathan Poole MRN: [id number] DOB: 1956-01-21  Jonathan Poole is a 67 y.o. year old male who sees Anabel Halon, MD for primary care. I spoke with patient's wife, Aydn Ferrara by phone today.  What matters to the patients health and wellness today?  Obtain Counseling, Supportive Services, & Resources.    Goals Addressed             This Visit's Progress    Obtain Counseling, Supportive Services, & Resources.   On track    Care Coordination Interventions:  Interventions Today    Flowsheet Row Most Recent Value  General Interventions   General Interventions Discussed/Reviewed [employer], Communication with  [Provided Adult Day Care Program Resources, Communicated with PCP]  Level of Care Adult Daycare      CSW Collaboration with Wife, Darl Pikes [place] to Validate Feelings of Caregiver Burnout, Stress, & Fatigue. CSW Collaboration with Wife, Darl Pikes [place] to [employer] of Feelings, Provide Active Listening, & Elicit Emotional Support. CSW Collaboration with Wife, Add [place] to [employer] a Journal, As a Means of Expressing Thoughts, Feelings, & Emotions. CSW Collaboration with Wife, Darl Pikes [place] to [employer] Receipt of Brochure for Adult Day Care Program, Thoroughly Reviewed, & Encouraged Contact with Representative with Aging, Disability, & Transit Services of [place] [phone]), to Place Referral. CSW Collaboration with Wife, Braxtin Bamba to Encourage Follow-Up Appointment with Dr. Trena Platt, Primary Care Provider with [facility] Primary Care [phone]), Offering to Assist Via 3-Way Call with Receptionist. CSW Collaboration with Wife, Jarnell Cordaro to Encouraged Engagement with Danford Bad, Social Work Case Manager with [facility] [phone]), if She Has Questions, Needs Assistance, or If Additional Social Work  Needs Are Identified Between Now & Our Next Follow-Up Outreach Call, Scheduled on 12/16/2022 at 3:30 PM. CSW Collaboration with Wife, Darl Pikes [place] to Encourage Engagement with Irving Shows, Nurse Care Coordinator with [facility] Health Department [phone]), During Follow-Up Outreach Call, Scheduled on 01/05/2023 at 2:15 PM. CSW Collaboration with Wife, Isador Castille to Encourage Attendance at Follow-Up Appointment with Dr. Lewayne Bunting, Cardiologist with [facility] HeartCare at [facility] [phone]), Scheduled on 01/28/2023 at 9:30 AM.      SDOH assessments and interventions completed:  Yes.  Care Coordination Interventions:  Yes, provided.   Follow up plan: Follow up call scheduled for 12/16/2022 at 3:30 pm.  Encounter Outcome:  Patient Visit Completed.   Danford Bad, BSW, MSW, Printmaker Social Work Case Set designer Health  [facility], Population Health Direct Dial: [phone]  Fax: [phone] Email: [email or web address] Website: [email or web address]

## 2022-11-13 NOTE — Patient Instructions (Signed)
Visit Information  Thank you for taking time to visit with me today. Please don't hesitate to contact me if I can be of assistance to you.   Following are the goals we discussed today:   Goals Addressed             This Visit's Progress    Obtain Counseling, Supportive Services, & Resources.   On track    Care Coordination Interventions:  Interventions Today    Flowsheet Row Most Recent Value  General Interventions   General Interventions Discussed/Reviewed [employer], Communication with  [Provided Adult Day Care Program Resources, Communicated with PCP]  Level of Care Adult Daycare      CSW Collaboration with Wife, Darl Pikes [place] to Validate Feelings of Caregiver Burnout, Stress, & Fatigue. CSW Collaboration with Wife, Darl Pikes [place] to [employer] of Feelings, Provide Active Listening, & Elicit Emotional Support. CSW Collaboration with Wife, Avinash Maltos to [employer] a Journal, As a Means of Expressing Thoughts, Feelings, & Emotions. CSW Collaboration with Wife, Darl Pikes [place] to [employer] Receipt of Brochure for Adult Day Care Program, Thoroughly Reviewed, & Encouraged Contact with Representative with Aging, Disability, & Transit Services of [place] [phone]), to Place Referral. CSW Collaboration with Wife, Tome Wilson to Encourage Follow-Up Appointment with Dr. Trena Platt, Primary Care Provider with [facility] Primary Care [phone]), Offering to Assist Via 3-Way Call with Receptionist. CSW Collaboration with Wife, Giordan Fordham to Encouraged Engagement with Danford Bad, Social Work Case Manager with [facility] [phone]), if She Has Questions, Needs Assistance, or If Additional Social Work Needs Are Identified Between Now & Our Next Follow-Up Outreach Call, Scheduled on 12/16/2022 at 3:30 PM. CSW Collaboration with Wife, Darl Pikes [place] to Encourage Engagement with Irving Shows, Nurse  Care Coordinator with [facility] Health Department [phone]), During Follow-Up Outreach Call, Scheduled on 01/05/2023 at 2:15 PM. CSW Collaboration with Wife, Jkai Arwood to Encourage Attendance at Follow-Up Appointment with Dr. Lewayne Bunting, Cardiologist with [facility] HeartCare at [facility] [phone]), Scheduled on 01/28/2023 at 9:30 AM.      Our next appointment is by telephone on 12/16/2022 at 3:30 pm.  Please call the care guide team at [phone] if you need to cancel or reschedule your appointment.   If you are experiencing a Mental Health or Behavioral Health Crisis or need someone to talk to, please call the Suicide and Crisis Lifeline: 988 call the [place] National Suicide Prevention Lifeline: 918-094-8660 or TTY: (845)433-6169 TTY 878-353-7961) to talk to a trained counselor call 1-800-273-TALK (toll free, 24 hour hotline) go to [facility] Urgent Care [address], [place] [phone]) call the [facility] Crisis Line: [phone] call 911  Patient verbalizes understanding of instructions and care plan provided today and agrees to view in MyChart. Active MyChart status and patient understanding of how to access instructions and care plan via MyChart confirmed with patient.     Telephone follow up appointment with care management team member scheduled for:  12/16/2022 at 3:30 pm.  Danford Bad, BSW, MSW, LCSW  Embedded Practice Social Work Case Manager  [facility], Population Health Direct Dial: [phone]  Fax: [phone] Email: [email or web address] Website: [email or web address]

## 2022-11-14 ENCOUNTER — Other Ambulatory Visit (HOSPITAL_COMMUNITY): Payer: Self-pay | Admitting: Specialist

## 2022-11-14 DIAGNOSIS — R413 Other amnesia: Secondary | ICD-10-CM

## 2022-11-20 ENCOUNTER — Telehealth: Payer: Self-pay | Admitting: Internal Medicine

## 2022-11-20 NOTE — Telephone Encounter (Signed)
Sample request for Eliquis received. Indication: PAF Last office visit: 01/03/22  [name] MD Scr:  1.00 on 02/05/22 Age: 67 Weight: 78.9kg  Based on above findings Eliquis 5mg  twice daily is the appropriate dose.  OK to provide samples if available.

## 2022-11-20 NOTE — Telephone Encounter (Signed)
Left a message for patient to call office regarding Eliquis samples.

## 2022-11-20 NOTE — Telephone Encounter (Signed)
Patient calling the office for samples of medication:   1.  What medication and dosage are you requesting samples for? apixaban (ELIQUIS) 5 MG TABS tablet  2.  Are you currently out of this medication? Yes    

## 2022-11-21 NOTE — Telephone Encounter (Signed)
Patient is returning call and is requesting return call.

## 2022-11-22 ENCOUNTER — Ambulatory Visit (HOSPITAL_COMMUNITY)
Admission: RE | Admit: 2022-11-22 | Discharge: 2022-11-22 | Disposition: A | Discharge status: Home / Self Care | Payer: Medicare HMO | Source: Ambulatory Visit | Attending: Specialist | Admitting: Specialist

## 2022-11-22 DIAGNOSIS — R413 Other amnesia: Secondary | ICD-10-CM | POA: Insufficient documentation

## 2022-11-22 DIAGNOSIS — R42 Dizziness and giddiness: Secondary | ICD-10-CM | POA: Diagnosis not present

## 2022-11-22 DIAGNOSIS — I6782 Cerebral ischemia: Secondary | ICD-10-CM | POA: Diagnosis not present

## 2022-11-22 DIAGNOSIS — R410 Disorientation, unspecified: Secondary | ICD-10-CM | POA: Diagnosis not present

## 2022-11-24 NOTE — Telephone Encounter (Signed)
[employer], Karl Pock, LPN      12/19/12  3:46 PM Note Pt was denied BMS ( Eliquis) patient assistance d/t not meeting his 3 % out of pocket for 2024. Pt needs to pay an additional $549.21.          I will ask PharmD if there are any patient pharmacy funds available to help.I have already heard from [facility], there are no resources available at this time

## 2022-11-24 NOTE — Telephone Encounter (Signed)
There are no additional resources. He will need to either enroll in the Xarelto program (this would be ~89/month), see if his insurance will cover dabigartan at a better price (hit or miss) or change to warfarin. This will be the least expensive, but he will need to come in frequently for finger sticks to check dose (could be a copay)

## 2022-11-25 DIAGNOSIS — M542 Cervicalgia: Secondary | ICD-10-CM | POA: Diagnosis not present

## 2022-11-25 DIAGNOSIS — Z79891 Long term (current) use of opiate analgesic: Secondary | ICD-10-CM | POA: Diagnosis not present

## 2022-11-25 DIAGNOSIS — F1120 Opioid dependence, uncomplicated: Secondary | ICD-10-CM | POA: Diagnosis not present

## 2022-11-25 DIAGNOSIS — G894 Chronic pain syndrome: Secondary | ICD-10-CM | POA: Diagnosis not present

## 2022-11-25 DIAGNOSIS — M5116 Intervertebral disc disorders with radiculopathy, lumbar region: Secondary | ICD-10-CM | POA: Diagnosis not present

## 2022-11-25 DIAGNOSIS — M546 Pain in thoracic spine: Secondary | ICD-10-CM | POA: Diagnosis not present

## 2022-11-28 MED ORDER — DABIGATRAN ETEXILATE MESYLATE 150 MG PO CAPS: 150.0000 mg | ORAL_CAPSULE | Freq: Two times a day (BID) | ORAL | 11 refills | Status: DC

## 2022-11-28 NOTE — Telephone Encounter (Signed)
Returned call to pt's wife per DPR and left detailed message letting them know that I just sent rx into pharmacy for Pradaxa and attached note on RX to use GoodRx if primary insurance doesn't cover.

## 2022-11-28 NOTE — Telephone Encounter (Signed)
Best GoodRx coupon rate is currently at [facility] for dabigatran (~$58/month). His dose would be dabigatran 150mg  twice daily.

## 2022-11-28 NOTE — Telephone Encounter (Signed)
Returned call to wife and patient who would like to try Pradaxa. Routing back to pharmD for dosing. (May need repeat BMET since last I see is from December 2023). Aware of Good RX and how to get card/coupon.

## 2022-11-28 NOTE — Telephone Encounter (Signed)
Patient's wife is returning call and is requesting call back. She requests a more detailed message in VM if they leave one.

## 2022-12-08 ENCOUNTER — Ambulatory Visit: Payer: Medicare HMO | Admitting: *Deleted

## 2022-12-08 NOTE — Patient Instructions (Signed)
Visit Information  Thank you for taking time to visit with me today. Please don't hesitate to contact me if I can be of assistance to you.   Following are the goals we discussed today:   Goals Addressed             This Visit's Progress    Obtain Counseling, Supportive Services & Resources.   On track    Care Coordination Interventions:  Interventions Today    Flowsheet Row Most Recent Value  Chronic Disease   Chronic disease during today's visit Other  [Alzheimer's Dementia with Behavioral Disturbance, Caregiver Burnout & Fatigue, Inability to Perform Activities of Daily Living Independently]  General Interventions   General Interventions Discussed/Reviewed General Interventions Discussed, General Interventions Reviewed, Annual Eye Exam, Labs, Annual Foot Exam, Lipid Profile, Durable Medical Equipment (DME), Vaccines, Health Screening, Walgreen, Doctor Visits, Communication with, Level of Care  [Communication with Care Team Members,  Encouraged]  Doctor Visits Discussed/Reviewed Doctor Visits Discussed, Specialist, Doctor Visits Reviewed, Annual Wellness Visits, PCP  [Encouraged Attendance]  Horticulturist, commercial (DME) Dan Humphreys, Shower bench  [Encouraged Use]  PCP/Specialist Visits Compliance with follow-up visit, Contact provider for referral to  [employer] provider for referral to PCP, Specialist, Dentist, Ophthalmologist  Communication with PCP/Specialists, RN, Pharmacists  Level of Care Adult Daycare, Applications  Education Interventions   Education Provided Provided Therapist, sports, Provided Web-based Education, Provided Education  Provided Verbal Education On Nutrition, Mental Health/Coping with Illness, When to see the doctor, Eye Care, Air traffic controller, [employer]  Mental Health Interventions   Mental Health Discussed/Reviewed Anxiety, Mental Health Discussed, Depression, Mental Health Reviewed, Grief and Loss, Substance Abuse, Coping  Strategies, Suicide, Crisis, Other  Nutrition Interventions   Nutrition Discussed/Reviewed Nutrition Discussed, Adding fruits and vegetables, Increasing proteins, Decreasing fats, Decreasing salt, Decreasing sugar intake, Portion sizes, Carbohydrate meal planning, Fluid intake, Nutrition Reviewed  Pharmacy Interventions   Pharmacy Dicussed/Reviewed Pharmacy Topics Discussed, Medications and their functions, Medication Adherence, Affording Medications, Pharmacy Topics Reviewed  Safety Interventions   Safety Discussed/Reviewed Safety Discussed, Safety Reviewed, Fall Risk  Home Safety Assistive Devices  Advanced Directive Interventions   Advanced Directives Discussed/Reviewed Advanced Care Planning, Advanced Directives Discussed, Advanced Directives Reviewed      Active Listening & Reflection Utilized. Verbalization of Feelings Encouraged. Emotional Support Provided. Solution-Focused Strategies Employed. Problem Solving Interventions Activated. Cognitive Behavioral Therapy Initiated. Acceptance & Commitment Therapy Performed. Encouraged Review of The Following List of [employer], [employer], Emailed on 12/08/2022:  ~ Personal Care Service Providers  ~ Home Health Care Agencies  ~ In-Home Care & Respite Agencies  ~ Respite Care Agencies & Facilities  ~ Adult Day Care Programs Encouraged Review of The Following List of [employer], Services & Resources, Emailed on 12/08/2022: ~ Alzheimers.gov https://www.alzheimers.gov ~ Alzheimer's & Dementia Resources  Alzheimer's Association BuyingShow.uy  ~ Alzheimer's Foundation of [place]  Resources for Alzheimer's and Dementia Caregivers https://alzfdn.org/caregiving-resources ~ Resources for Patients and Caregivers  Department of Neurology LoyaltyUs.is ~ Dementia Capable [place]   NCDHHS https://www.lawson-evans.com/  ~ Resources  NCBrain TripMetro.co.za  Encouraged Review of The Following List of [employer], [employer], Emailed on 12/08/2022: ~ Economist in [place] ~ Mental Health Resources ~ Referrals Chart for Group Programs ~ Marriage, Relationship, Individual & Family Counseling Services ~ Psychiatrists in [place], [place] & [place] ~ Support Groups in [place] CSW Collaboration with Wife, Darl Pikes [place] to Encourage Engagement with Danford Bad, Social Work Sports coach with [employer] [phone]),  if She Has Questions, Needs Assistance, or If Additional Social Work Needs Are Identified Between Now & Our Next Follow-Up Outreach Call, Scheduled on 12/16/2022 at 3:30 PM.      Our next appointment is by telephone on 12/16/2022 at 3:30 pm.  Please call the care guide team at [phone] if you need to cancel or reschedule your appointment.   If you are experiencing a Mental Health or Behavioral Health Crisis or need someone to talk to, please call the Suicide and Crisis Lifeline: 988 call the [place] National Suicide Prevention Lifeline: 3864451259 or TTY: 561 204 2049 TTY 272-601-1071) to talk to a trained counselor call 1-800-273-TALK (toll free, 24 hour hotline) go to [facility] Urgent Care [address] [place] [phone]) call the [facility] Crisis Line: [phone] call 911  Patient verbalizes understanding of instructions and care plan provided today and agrees to view in MyChart. Active MyChart status and patient understanding of how to access instructions and care plan via MyChart confirmed with patient.     Telephone follow up appointment with care management team member scheduled for:  12/16/2022 at 3:30 pm.  Danford Bad, BSW, MSW, LCSW  Embedded Practice Social Work Case Manager  [facility], Population Health Direct Dial: [phone]  Fax: [phone] Email: [email or web address]: [email or web address]

## 2022-12-08 NOTE — Patient Outreach (Signed)
Care Coordination   Follow Up Visit Note   12/09/2022  Name: Jonathan Poole MRN: [id number] DOB: 1955/06/22  Jonathan Poole is a 67 y.o. year old male who sees Anabel Halon, MD for primary care. I spoke with patient's wife, Adriano Kaba by phone today.  What matters to the patients health and wellness today?  Obtain Counseling, Supportive Services & Resources.   Goals Addressed             This Visit's Progress    Obtain Counseling, Supportive Services & Resources.   On track    Care Coordination Interventions:  Interventions Today    Flowsheet Row Most Recent Value  Chronic Disease   Chronic disease during today's visit Other  [Alzheimer's Dementia with Behavioral Disturbance, Caregiver Burnout & Fatigue, Inability to Perform Activities of Daily Living Independently]  General Interventions   General Interventions Discussed/Reviewed General Interventions Discussed, General Interventions Reviewed, Annual Eye Exam, Labs, Annual Foot Exam, Lipid Profile, Durable Medical Equipment (DME), Vaccines, Health Screening, [employer], Doctor Visits, Communication with, Level of Care  [Communication with Care Team Members,  Encouraged]  Doctor Visits Discussed/Reviewed Doctor Visits Discussed, Specialist, Doctor Visits Reviewed, Annual Wellness Visits, PCP  [Encouraged Attendance]  Horticulturist, commercial (DME) Dan Humphreys, Shower bench  [Encouraged Use]  PCP/Specialist Visits Compliance with follow-up visit, Contact provider for referral to  [employer] provider for referral to PCP, Specialist, Dentist, Ophthalmologist  Communication with PCP/Specialists, RN, Pharmacists  Level of Care Adult Daycare, Applications  Education Interventions   Education Provided Provided Therapist, sports, Provided Web-based Education, Provided Education  Provided Verbal Education On Nutrition, Mental Health/Coping with Illness, When to see the doctor, Eye Care, Air traffic controller,  [employer]  Mental Health Interventions   Mental Health Discussed/Reviewed Anxiety, Mental Health Discussed, Depression, Mental Health Reviewed, Grief and Loss, Substance Abuse, Coping Strategies, Suicide, Crisis, Other  Nutrition Interventions   Nutrition Discussed/Reviewed Nutrition Discussed, Adding fruits and vegetables, Increasing proteins, Decreasing fats, Decreasing salt, Decreasing sugar intake, Portion sizes, Carbohydrate meal planning, Fluid intake, Nutrition Reviewed  Pharmacy Interventions   Pharmacy Dicussed/Reviewed Pharmacy Topics Discussed, Medications and their functions, Medication Adherence, Affording Medications, Pharmacy Topics Reviewed  Safety Interventions   Safety Discussed/Reviewed Safety Discussed, Safety Reviewed, Fall Risk  Home Safety Assistive Devices  Advanced Directive Interventions   Advanced Directives Discussed/Reviewed Advanced Care Planning, Advanced Directives Discussed, Advanced Directives Reviewed      Active Listening & Reflection Utilized. Verbalization of Feelings Encouraged. Emotional Support Provided. Solution-Focused Strategies Employed. Problem Solving Interventions Activated. Cognitive Behavioral Therapy Initiated. Acceptance & Commitment Therapy Performed. Encouraged Review of The Following List of [employer], Emailed on 12/08/2022:  ~ Personal Care Service Providers  ~ Home Health Care Agencies  ~ In-Home Care & Respite Agencies  ~ Respite Care Agencies & Facilities  ~ Adult Day Care Programs Encouraged Review of The Following List of [employer], Services & Resources, Emailed on 12/08/2022: ~ Alzheimers.gov https://www.alzheimers.gov ~ Alzheimer's & Dementia Resources  Alzheimer's Association BuyingShow.uy  ~ Alzheimer's Foundation of [place]  Resources for Alzheimer's and Dementia Caregivers https://alzfdn.org/caregiving-resources ~ Resources for Patients  and Caregivers  Department of Neurology LoyaltyUs.is ~ Dementia Capable [place]  NCDHHS [email or web address].lawson-evans.com/  ~ Resources  NCBrain [email or web address]  Encouraged Review of The Following List of [employer], [employer], Emailed on 12/08/2022: ~ Economist in [place] ~ Mental Health Resources ~ Referrals Chart for Group Programs ~ Marriage, Relationship, Individual & Family Counseling Services ~ Psychiatrists in [place], [place] &  [facility] ~ Support Groups in [place] CSW Collaboration with Wife, Jonathan Poole to Encourage Engagement with Danford Bad, Social Work Case Production designer, theatre/television/film with [facility] [phone]), if She Has Questions, Needs Assistance, or If Additional Social Work Needs Are Identified Between Now & Our Next Follow-Up Outreach Call, Scheduled on 12/16/2022 at 3:30 PM.      SDOH assessments and interventions completed:  Yes.  Care Coordination Interventions:  Yes, provided.   Follow up plan: Follow up call scheduled for 12/16/2022 at 3:30 pm.  Encounter Outcome:  Patient Visit Completed.   Danford Bad, BSW, MSW, Printmaker Social Work Case Set designer Health  [facility], Population Health Direct Dial: [phone]  Fax: [phone] Email: [email or web address] Website: [email or web address]

## 2022-12-10 ENCOUNTER — Telehealth: Payer: Self-pay | Admitting: *Deleted

## 2022-12-10 NOTE — Progress Notes (Signed)
  Care Coordination Note  12/10/2022 Name: PAXTYN EVARD MRN: [id number] DOB: 04-05-1955  Pearletha Furl Florer is a 67 y.o. year old male who is a primary care patient of No primary care provider on file. and is actively engaged with the care management team. I reached out to Shamere A Pelham by phone today to assist with re-scheduling a follow up visit with the RN Case Manager  Follow up plan: Unsuccessful telephone outreach attempt made. A HIPAA compliant phone message was left for the patient providing contact information and requesting a return call.   [facility]  Care Coordination Care Guide  Direct Dial: [phone]

## 2022-12-12 NOTE — Progress Notes (Unsigned)
  Care Coordination Note  12/12/2022 Name: Jonathan Poole MRN: [id number] DOB: 08-21-1955  Jonathan Poole is a 67 y.o. year old male who is a primary care patient of No primary care provider on file. and is actively engaged with the care management team. I reached out to Jonathan Poole by phone today to assist with re-scheduling a follow up visit with the RN Case Manager  Follow up plan: Unsuccessful telephone outreach attempt made. A HIPAA compliant phone message was left for the patient providing contact information and requesting a return call.   [facility]  Care Coordination Care Guide  Direct Dial: [phone]

## 2022-12-15 NOTE — Progress Notes (Signed)
  Care Coordination Note  12/15/2022 Name: JACOBY ZANNI MRN: [id number] DOB: January 04, 1956  Pearletha Furl Boyd is a 67 y.o. year old male who is a primary care patient of No primary care provider on file. and is actively engaged with the care management team. I reached out to Jatavious A Lorincz by phone today to assist with re-scheduling a follow up visit with the RN Case Manager  Follow up plan: Telephone appointment with care management team member scheduled for:01/06/23  [facility] Coordination Care Guide  Direct Dial: [phone]

## 2022-12-16 ENCOUNTER — Ambulatory Visit: Payer: Self-pay | Admitting: *Deleted

## 2022-12-16 DIAGNOSIS — G309 Alzheimer's disease, unspecified: Secondary | ICD-10-CM

## 2022-12-16 DIAGNOSIS — I480 Paroxysmal atrial fibrillation: Secondary | ICD-10-CM

## 2022-12-16 DIAGNOSIS — Z950 Presence of cardiac pacemaker: Secondary | ICD-10-CM

## 2022-12-16 DIAGNOSIS — E782 Mixed hyperlipidemia: Secondary | ICD-10-CM

## 2022-12-16 NOTE — Patient Instructions (Signed)
Visit Information  Thank you for taking time to visit with me today. Please don't hesitate to contact me if I can be of assistance to you.   Following are the goals we discussed today:   Goals Addressed             This Visit's Progress    Obtain Counseling, Supportive Services & Resources.   On track    Care Coordination Interventions:  Interventions Today    Flowsheet Row Most Recent Value  Chronic Disease   Chronic disease during today's visit Other  [Alzheimer's Dementia with Behavioral Disturbance, Caregiver Burnout & Fatigue, Inability to Perform Activities of Daily Living Independently]  General Interventions   General Interventions Discussed/Reviewed General Interventions Discussed, General Interventions Reviewed, Annual Eye Exam, Labs, Annual Foot Exam, Lipid Profile, Durable Medical Equipment (DME), Vaccines, Health Screening, [employer], Doctor Visits, Communication with, Level of Care  [Communication with Care Team Members,  Encouraged]  Doctor Visits Discussed/Reviewed Doctor Visits Discussed, Specialist, Doctor Visits Reviewed, Annual Wellness Visits, PCP  [Encouraged Attendance]  Horticulturist, commercial (DME) Dan Humphreys, Shower bench  [Encouraged Use]  PCP/Specialist Visits Compliance with follow-up visit, Contact provider for referral to  [employer] provider for referral to PCP, Specialist, Dentist, Ophthalmologist  Communication with PCP/Specialists, RN, Pharmacists  Level of Care Adult Daycare, Applications  Education Interventions   Education Provided Provided Therapist, sports, Provided Web-based Education, Provided Education  Provided Verbal Education On Nutrition, Mental Health/Coping with Illness, When to see the doctor, Eye Care, Air traffic controller, [employer]  Mental Health Interventions   Mental Health Discussed/Reviewed Anxiety, Mental Health Discussed, Depression, Mental Health Reviewed, Grief and Loss, Substance Abuse, Coping  Strategies, Suicide, Crisis, Other  Nutrition Interventions   Nutrition Discussed/Reviewed Nutrition Discussed, Adding fruits and vegetables, Increasing proteins, Decreasing fats, Decreasing salt, Decreasing sugar intake, Portion sizes, Carbohydrate meal planning, Fluid intake, Nutrition Reviewed  Pharmacy Interventions   Pharmacy Dicussed/Reviewed Pharmacy Topics Discussed, Medications and their functions, Medication Adherence, Affording Medications, Pharmacy Topics Reviewed  Safety Interventions   Safety Discussed/Reviewed Safety Discussed, Safety Reviewed, Fall Risk  Home Safety Assistive Devices  Advanced Directive Interventions   Advanced Directives Discussed/Reviewed Advanced Care Planning, Advanced Directives Discussed, Advanced Directives Reviewed      Active Listening & Reflection Utilized. Verbalization of Feelings Encouraged. Emotional Support Provided. Solution-Focused Strategies Revisited. Problem Solving Interventions Revised. Cognitive Behavioral Therapy Implemented. Acceptance & Commitment Therapy Initiated. CSW Collaboration with Dr. Trena Platt, Primary Care Provider with [facility] [phone]), Via Routed Note in Epic, to Provide Update on Patient Status.  CSW Collaboration with Dr. Aletha Halim, Neurologist with [facility] in [place] [phone]; # [id number]), Via Routed Note in Epic, to Pose List of Questions & Concerns to Address During Follow-Up Appointment on 12/18/2022 at 3:10 PM, Per Wife, Darl Pikes Hagerty's Request.  Confirmed Receipt, Thoroughly Reviewed & Encouraged Engagement with The Following List of [employer], Wellsite geologist of Interest in [place]: ~ Personal Care Service Providers ~ Home Health Care Agencies ~ In-Home Care & Respite Agencies ~ Respite Care Agencies & Facilities ~ Adult Day Care Programs Confirmed Receipt, Thoroughly Reviewed & Encouraged Engagement with  The Following List of [employer], Services & Resources of Interest: ~ Alzheimers.gov https://www.alzheimers.gov ~ Alzheimer's & Dementia Resources  Alzheimer's Association BuyingShow.uy  ~ Alzheimer's Foundation of [place]  Resources for Alzheimer's and Dementia Caregivers https://alzfdn.org/caregiving-resources ~ Resources for Patients and Caregivers  Department of Neurology LoyaltyUs.is ~ Dementia Capable [place]  NCDHHS https://www.lawson-evans.com/  ~  Resources  NCBrain [email or web address]  Confirmed Receipt, Thoroughly Reviewed & Encouraged Engagement with The Following List of [employer], Services & Resources of Interest in [place]: ~ [employer] in [place] ~ Mental Health Resources ~ Referrals Chart for [employer] ~ Marriage, Relationship, Individual & Family Counseling Services ~ Psychiatrists in [place], [place] & [place] ~ Support Groups in [place] CSW Collaboration with Wife, Darl Pikes Fiallos to Encourage Engagement with Danford Bad, Social Work Sports coach with [employer] [phone]), if She Has Questions, Needs Assistance, or If Additional Social Work Needs Are Identified Between Now & Our Next Follow-Up Outreach Call, Scheduled on 12/29/2022 at 3:45 PM.      Our next appointment is by telephone on 12/29/2022 at 3:45 pm.  Please call the care guide team at [phone] if you need to cancel or reschedule your appointment.   If you are experiencing a Mental Health or Behavioral Health Crisis or need someone to talk to, please call the Suicide and Crisis Lifeline: 988 call the [place] National Suicide Prevention Lifeline: (440)093-3584 or TTY: 4437923947 TTY 825-858-0736) to talk to a trained counselor call 1-800-273-TALK (toll  free, 24 hour hotline) go to [facility] [address] [place] [phone]) call the [facility] Crisis Line: (619)877-7145 call 911  Patient verbalizes understanding of instructions and care plan provided today and agrees to view in MyChart. Active MyChart status and patient understanding of how to access instructions and care plan via MyChart confirmed with patient.     Telephone follow up appointment with care management team member scheduled for:  12/29/2022 at 3:45 pm.  Danford Bad, BSW, MSW, LCSW  Embedded Practice Social Work Case Manager  [facility], Population Health Direct Dial: [phone]  Fax: [phone] Email: [email or web address] Website: [email or web address]

## 2022-12-16 NOTE — Patient Outreach (Signed)
Care Coordination   Follow Up Visit Note   12/16/2022  Name: Jonathan Poole MRN: [id number] DOB: 10-22-1955  Jonathan Poole is a 67 y.o. year old male who sees No primary care provider on file. for primary care. I spoke with patient's wife, Jonathan Poole by phone today.  What matters to the patients health and wellness today?  Obtain Counseling, Supportive Services & Resources.    Goals Addressed             This Visit's Progress    Obtain Counseling, Supportive Services & Resources.   On track    Care Coordination Interventions:  Interventions Today    Flowsheet Row Most Recent Value  Chronic Disease   Chronic disease during today's visit Other  [Alzheimer's Dementia with Behavioral Disturbance, Caregiver Burnout & Fatigue, Inability to Perform Activities of Daily Living Independently]  General Interventions   General Interventions Discussed/Reviewed General Interventions Discussed, General Interventions Reviewed, Annual Eye Exam, Labs, Annual Foot Exam, Lipid Profile, Durable Medical Equipment (DME), Vaccines, Health Screening, [employer], Doctor Visits, Communication with, Level of Care  [Communication with Care Team Members,  Encouraged]  Doctor Visits Discussed/Reviewed Doctor Visits Discussed, Specialist, Doctor Visits Reviewed, Annual Wellness Visits, PCP  [Encouraged Attendance]  Horticulturist, commercial (DME) Dan Humphreys, Shower bench  [Encouraged Use]  PCP/Specialist Visits Compliance with follow-up visit, Contact provider for referral to  [employer] provider for referral to PCP, Specialist, Dentist, Ophthalmologist  Communication with PCP/Specialists, RN, Pharmacists  Level of Care Adult Daycare, Applications  Education Interventions   Education Provided Provided Therapist, sports, Provided Web-based Education, Provided Education  Provided Verbal Education On Nutrition, Mental Health/Coping with Illness, When to see the doctor, Eye Care,  Air traffic controller, [employer]  Mental Health Interventions   Mental Health Discussed/Reviewed Anxiety, Mental Health Discussed, Depression, Mental Health Reviewed, Grief and Loss, Substance Abuse, Coping Strategies, Suicide, Crisis, Other  Nutrition Interventions   Nutrition Discussed/Reviewed Nutrition Discussed, Adding fruits and vegetables, Increasing proteins, Decreasing fats, Decreasing salt, Decreasing sugar intake, Portion sizes, Carbohydrate meal planning, Fluid intake, Nutrition Reviewed  Pharmacy Interventions   Pharmacy Dicussed/Reviewed Pharmacy Topics Discussed, Medications and their functions, Medication Adherence, Affording Medications, Pharmacy Topics Reviewed  Safety Interventions   Safety Discussed/Reviewed Safety Discussed, Safety Reviewed, Fall Risk  Home Safety Assistive Devices  Advanced Directive Interventions   Advanced Directives Discussed/Reviewed Advanced Care Planning, Advanced Directives Discussed, Advanced Directives Reviewed      Active Listening & Reflection Utilized. Verbalization of Feelings Encouraged. Emotional Support Provided. Solution-Focused Strategies Revisited. Problem Solving Interventions Revised. Cognitive Behavioral Therapy Implemented. Acceptance & Commitment Therapy Initiated. CSW Collaboration with Dr. Trena Platt, Primary Care Provider with [facility] [phone]), Via Routed Note in Epic, to Provide Update on Patient Status.  CSW Collaboration with Dr. Aletha Halim, Neurologist with [facility] in [place] [phone]; # [id number]), Via Routed Note in Epic, to Pose List of Questions & Concerns to Address During Follow-Up Appointment on 12/18/2022 at 3:10 PM, Per Wife, Jonathan Poole's Request.  Confirmed Receipt, Thoroughly Reviewed & Encouraged Engagement with The Following List of [employer], Wellsite geologist of Interest in [place]: ~ Personal Care Service  Providers ~ Home Health Care Agencies ~ In-Home Care & Respite Agencies ~ Respite Care Agencies & Facilities ~ Adult Day Care Programs Confirmed Receipt, Thoroughly Reviewed & Encouraged Engagement with The Following List of [employer], Services & Resources of Interest: ~ Alzheimers.gov https://www.alzheimers.gov ~ Alzheimer's & Dementia Resources  Alzheimer's Association  BuyingShow.uy  ~ Alzheimer's Foundation of [place]  Resources for Alzheimer's and Dementia Caregivers https://alzfdn.org/caregiving-resources ~ Resources for Patients and Caregivers  Department of Neurology LoyaltyUs.is ~ Dementia Capable [place]  NCDHHS [email or web address].lawson-[email or web address]/  ~ Resources  NCBrain TripMetro.[email or web address]  Confirmed Receipt, Thoroughly Reviewed & Encouraged Engagement with The Following List of [employer], Wellsite geologist of Interest in [place]: ~ [employer] in [place] ~ Mental Health Resources ~ Referrals Chart for [employer] ~ Marriage, Relationship, Individual & Family Counseling Services ~ Psychiatrists in [place], [place] & [place] ~ Support Groups in [place] CSW Collaboration with Wife, Jonathan Poole to Encourage Engagement with Jonathan Poole, Social Work Sports coach with [employer] [phone]), if She Has Questions, Needs Assistance, or If Additional Social Work Needs Are Identified Between Now & Our Next Follow-Up Outreach Call, Scheduled on 12/29/2022 at 3:45 PM.      SDOH assessments and interventions completed:  Yes.  Care Coordination Interventions:  Yes, provided.   Follow up plan: Follow up call scheduled for 12/29/2022 at 3:45 pm.  Encounter Outcome:  Patient Visit Completed.   Jonathan Poole, BSW, MSW, Location manager Social Work Case Set designer Health  [facility], Population Health Direct Dial: [phone]  Fax: [phone] Email: [email or web address] Website: [email or web address]

## 2022-12-17 ENCOUNTER — Other Ambulatory Visit: Payer: Self-pay | Admitting: Internal Medicine

## 2022-12-17 DIAGNOSIS — E785 Hyperlipidemia, unspecified: Secondary | ICD-10-CM

## 2022-12-18 DIAGNOSIS — G894 Chronic pain syndrome: Secondary | ICD-10-CM | POA: Diagnosis not present

## 2022-12-18 DIAGNOSIS — G9349 Other encephalopathy: Secondary | ICD-10-CM | POA: Diagnosis not present

## 2022-12-18 DIAGNOSIS — G300 Alzheimer's disease with early onset: Secondary | ICD-10-CM | POA: Diagnosis not present

## 2022-12-18 DIAGNOSIS — M7918 Myalgia, other site: Secondary | ICD-10-CM | POA: Diagnosis not present

## 2022-12-18 DIAGNOSIS — R451 Restlessness and agitation: Secondary | ICD-10-CM | POA: Diagnosis not present

## 2022-12-18 DIAGNOSIS — R252 Cramp and spasm: Secondary | ICD-10-CM | POA: Diagnosis not present

## 2022-12-19 ENCOUNTER — Telehealth: Payer: Self-pay | Admitting: *Deleted

## 2022-12-19 NOTE — Progress Notes (Unsigned)
  Care Coordination Note  12/19/2022 Name: KORNELL HOR MRN: [id number] DOB: 12-19-1955  Jonathan Poole is a 67 y.o. year old male who is a primary care patient of No primary care provider on file. and is actively engaged with the care management team. I reached out to Kelven A Polson by phone today to assist with scheduling an initial visit with the BSW  Follow up plan: Unsuccessful telephone outreach attempt made. A HIPAA compliant phone message was left for the patient providing contact information and requesting a return call.   [facility]  Care Coordination Care Guide  Direct Dial: [phone]

## 2022-12-19 NOTE — Addendum Note (Signed)
Addended by: Danford Bad D on: 12/19/2022 08:36 AM   Modules accepted: Orders

## 2022-12-22 NOTE — Progress Notes (Signed)
  Care Coordination Note  12/22/2022 Name: Jonathan Poole MRN: [id number] DOB: 04/12/1955  [name] is a 67 y.o. year old male who is a primary care patient of No primary care provider on file. and is actively engaged with the care management team. I reached out to Daryle A Krempasky by phone today to assist with scheduling an initial visit with the BSW  Follow up plan: Telephone appointment with care management team member scheduled for:12/30/22  [facility] Coordination Care Guide  Direct Dial: [phone]

## 2022-12-23 DIAGNOSIS — G894 Chronic pain syndrome: Secondary | ICD-10-CM | POA: Diagnosis not present

## 2022-12-23 DIAGNOSIS — M542 Cervicalgia: Secondary | ICD-10-CM | POA: Diagnosis not present

## 2022-12-23 DIAGNOSIS — M5116 Intervertebral disc disorders with radiculopathy, lumbar region: Secondary | ICD-10-CM | POA: Diagnosis not present

## 2022-12-23 DIAGNOSIS — M546 Pain in thoracic spine: Secondary | ICD-10-CM | POA: Diagnosis not present

## 2022-12-23 DIAGNOSIS — F1120 Opioid dependence, uncomplicated: Secondary | ICD-10-CM | POA: Diagnosis not present

## 2022-12-29 ENCOUNTER — Encounter: Payer: Self-pay | Admitting: *Deleted

## 2022-12-29 ENCOUNTER — Ambulatory Visit: Payer: Self-pay | Admitting: *Deleted

## 2022-12-29 NOTE — Patient Outreach (Signed)
  Care Coordination   12/29/2022  Name: Jonathan Poole MRN: [id number] DOB: [birth year]   Care Coordination Outreach Attempts:  An unsuccessful telephone outreach was attempted today to offer the patient information about available care coordination services. HIPAA compliant messages left on voicemail for patient's wife, Jonathan Poole, providing contact information for CSW, encouraging her to return CSW's call at her earliest convenience.  Follow Up Plan:  Additional outreach attempts will be made to offer the patient care coordination information and services.   Encounter Outcome:  No Answer.   Care Coordination Interventions:  No, not indicated.    Danford Bad, BSW, MSW, Printmaker Social Work Case Set designer Health  [facility], Population Health Direct Dial: [phone]  Fax: [phone] Email: [email or web address]: [email or web address]

## 2022-12-30 ENCOUNTER — Ambulatory Visit (INDEPENDENT_AMBULATORY_CARE_PROVIDER_SITE_OTHER): Payer: Medicare HMO

## 2022-12-30 ENCOUNTER — Ambulatory Visit: Payer: Self-pay

## 2022-12-30 DIAGNOSIS — I495 Sick sinus syndrome: Secondary | ICD-10-CM | POA: Diagnosis not present

## 2022-12-30 LAB — CUP PACEART REMOTE DEVICE CHECK
Battery Remaining Longevity: 86 mo
Battery Remaining Percentage: 73 %
Battery Voltage: 2.99 V
Brady Statistic AP VP Percent: 1.6 %
Brady Statistic AP VS Percent: 96 %
Brady Statistic AS VP Percent: 1 %
Brady Statistic AS VS Percent: 2.2 %
Brady Statistic RA Percent Paced: 97 %
Brady Statistic RV Percent Paced: 1.6 %
Date Time Interrogation Session: 20241119040015
Implantable Lead Connection Status: 753985
Implantable Lead Connection Status: 753985
Implantable Lead Implant Date: 20100504
Implantable Lead Implant Date: 20100524
Implantable Lead Location: 753859
Implantable Lead Location: 753860
Implantable Pulse Generator Implant Date: 20211122
Lead Channel Impedance Value: 460 Ohm
Lead Channel Impedance Value: 490 Ohm
Lead Channel Pacing Threshold Amplitude: 0.75 V
Lead Channel Pacing Threshold Amplitude: 1.125 V
Lead Channel Pacing Threshold Pulse Width: 0.3 ms
Lead Channel Pacing Threshold Pulse Width: 0.7 ms
Lead Channel Sensing Intrinsic Amplitude: 5 mV
Lead Channel Sensing Intrinsic Amplitude: 5.6 mV
Lead Channel Setting Pacing Amplitude: 1.375
Lead Channel Setting Pacing Amplitude: 1.5 V
Lead Channel Setting Pacing Pulse Width: 0.7 ms
Lead Channel Setting Sensing Sensitivity: 0.7 mV
Pulse Gen Model: 2272
Pulse Gen Serial Number: 3864825

## 2022-12-30 NOTE — Patient Instructions (Signed)
Visit Information  Thank you for taking time to visit with me today. Please don't hesitate to contact me if I can be of assistance to you.   Following are the goals we discussed today:   Goals Addressed             This Visit's Progress    Obtain Counseling, Supportive Services & Resources.   On track    Care Coordination Interventions:  Interventions Today    Flowsheet Row Most Recent Value  Chronic Disease   Chronic disease during today's visit Other  [Alzheimer's Dementia with Behavioral Disturbance, Caregiver Burnout, Stress & Fatigue, Inability to Perform Activities of Daily Living Independently, Inappropriate & Argumentative Behavior, Shuffling Feet, Unsteady Balance/Gait]  General Interventions   General Interventions Discussed/Reviewed General Interventions Discussed, General Interventions Reviewed, Durable Medical Equipment (DME), Community Resources, Communication with, Level of Care, Doctor Visits  [Communication with Care Team Members & Primary Care Provider]  Doctor Visits Discussed/Reviewed Doctor Visits Discussed, Specialist, Doctor Visits Reviewed, Annual Wellness Visits, PCP  [Encouraged Routine Engagement]  Horticulturist, commercial (DME) BP Cuff, Environmental consultant, Tour manager, Other  [Prescription Eyeglasses, Manufacturing systems engineer, Engineer, materials in [place]]  PCP/Specialist Visits Compliance with follow-up visit  [Encouraged Routine Engagement]  Contacted provider for referral to --  [facility] Consult & Neurology Consult]  Communication with PCP/Specialists  [Request Order for Palliative Care Consult & Neurology Consult]  Level of Care Adult Daycare, Assisted Living  [Confirmed Disinterest in Pursuing Higher Level of Care Placement Options or Adult Day Care Programs]  Applications Medicaid, Personal Care Services  [Confirmed Disinterest in Applying for Medicaid or Personal Care Services]  Education Interventions   Education Provided Provided Education  Provided Verbal  Education On Mental Health/Coping with Illness, When to see the doctor, Applications, [employer], Human resources officer, Personal Care Services  [Confirmed Disinterest in Applying for [employer] or Personal Care Services]  Mental Health Interventions   Mental Health Discussed/Reviewed Anxiety, Mental Health Discussed, Depression, Mental Health Reviewed, Grief and Loss, Substance Abuse, Coping Strategies, Suicide, Crisis  [Assessed Mental Health of Patient & Caregiver/Wife, Jonathan Poole]  Safety Interventions   Safety Discussed/Reviewed Safety Discussed, Safety Reviewed, Fall Risk, Home Safety  [Encouraged Consideration of Home Safety Evaluation]  Journalist, newspaper, Refer for community resources  [Encouraged Consistent Use of Assistive Devices]  Advanced Directive Interventions   Advanced Directives Discussed/Reviewed Advanced Directives Discussed      Active Listening & Reflection Utilized. Verbalization of Feelings Encouraged. Emotional Support Provided. Solution-Focused Strategies Activated. Problem Solving Interventions Employed. Cognitive Behavioral Therapy Initiated. Acceptance & Commitment Therapy Performed. CSW Collaboration with Dr. Trena Platt, Primary Care Provider with [facility] [phone]), Via Routed Note in Epic, to Request Order & Referral for Palliative Care Consult, Per Wife, Jonathan Poole's Request.   Encouraged Continued Engagement with In-Home Care Agencies, Services & Resources of Interest, from List Provided:  ~ Personal Care Service Providers ~ Home Health Care Agencies ~ In-Home Care & Respite Agencies ~ Respite Care Agencies & Facilities ~ Adult Day Care Programs Encouraged Continued Engagement with Support Group Agencies, Services & Resources of Interest, from List Provided: ~ Alzheimers.gov https://www.alzheimers.gov ~ Alzheimer's & Dementia Resources  Alzheimer's  Association BuyingShow.uy  ~ Alzheimer's Foundation of [place]  Resources for Alzheimer's and Dementia Caregivers https://alzfdn.org/caregiving-resources ~ Resources for Patients and Caregivers  Department of Neurology LoyaltyUs.is ~ Dementia Capable [place]  NCDHHS https://www.lawson-evans.com/  ~ Resources  [employer] [email or web address]  Encouraged Continued Engagement with [employer], Services &  Resources of Interest, from List Provided: ~ Counseling Agencies in [place] ~ Mental Health Resources ~ Referrals Chart for Group Programs ~ Marriage, Relationship, Individual & Family Counseling Services ~ Psychiatrists in [place], [place] & [place] ~ Support Groups in [place] CSW Collaboration with Wife, Jonathan Poole to Encourage Engagement with Jonathan Poole, Licensed Clinical Social Worker with [employer] [phone]), if She Has Questions, Needs Assistance, or If Additional Social Work Needs Are Identified Between Now & Our Next Follow-Up Outreach Call, Scheduled on 01/20/2023 at 10:15 AM.      Our next appointment is by telephone on 01/20/2023 at 10:15 am.  Please call the care guide team at [phone] if you need to cancel or reschedule your appointment.   If you are experiencing a Mental Health or Behavioral Health Crisis or need someone to talk to, please call the Suicide and Crisis Lifeline: 988 call the [place] National Suicide Prevention Lifeline: 605-519-2950 or TTY: (646)392-0311 TTY 434-058-7671) to talk to a trained counselor call 1-800-273-TALK (toll free, 24 hour hotline) go to [facility] [address], [place] [phone]) call the [facility] Crisis Line: ([phone] call 911  Patient verbalizes  understanding of instructions and care plan provided today and agrees to view in MyChart. Active MyChart status and patient understanding of how to access instructions and care plan via MyChart confirmed with patient.     Telephone follow up appointment with care management team member scheduled for:   01/20/2023 at 10:15 am.  Jonathan Poole, BSW, MSW, LCSW  Embedded Practice Social Work Case Manager  [facility], Population Health Direct Dial: [phone]  Fax: [phone] Email: [email or web address] Website: [email or web address]

## 2022-12-30 NOTE — Patient Outreach (Signed)
Care Coordination   Initial Visit Note   12/30/2022 Name: Jonathan Poole MRN: [id number] DOB: 1955-04-01  Jonathan Poole is a 67 y.o. year old male who sees No primary care provider on file. for primary care. I spoke with  Jonathan Poole's wife by phone today.  What matters to the patients health and wellness today?  Patients wife wants someone to talk to patient to give him a different perspective.  Wife struggles to explain exactly what is needed.  SW does encourage wife to utilize resources provide during previous encounter with LCSW for additional counseling.     Goals Addressed             This Visit's Progress    Coping with Dementia       Interventions Today    Flowsheet Row Most Recent Value  Chronic Disease   Chronic disease during today's visit Atrial Fibrillation (AFib)  General Interventions   General Interventions Discussed/Reviewed General Interventions Discussed, General Interventions Reviewed, Level of Care  [Pt is cared for by his wife and son. Pt wife wants more assistance. Wife states they can't pay out of pocket. SW encourage to connect w/resources provided by LCSW to inquire on additional funding. Wife does not feel pt needs placement at this time.]           Obtain Counseling, Supportive Services & Resources.       Care Coordination Interventions:  Interventions Today    Flowsheet Row Most Recent Value  Chronic Disease   Chronic disease during today's visit Other  [Alzheimer's Dementia with Behavioral Disturbance, Caregiver Burnout, Stress & Fatigue, Inability to Perform Activities of Daily Living Independently, Inappropriate & Argumentative Behavior, Shuffling Feet, Unsteady Balance/Gait]  General Interventions   General Interventions Discussed/Reviewed General Interventions Discussed, General Interventions Reviewed, Durable Medical Equipment (DME), Community Resources, Communication with, Level of Care, Doctor Visits  [Communication with  Care Team Members & Primary Care Provider]  Doctor Visits Discussed/Reviewed Doctor Visits Discussed, Specialist, Doctor Visits Reviewed, Annual Wellness Visits, PCP  [Encouraged Routine Engagement]  Horticulturist, commercial (DME) BP Cuff, Environmental consultant, Tour manager, Other  [Prescription Eyeglasses, Manufacturing systems engineer, Engineer, materials in [place]]  PCP/Specialist Visits Compliance with follow-up visit  [Encouraged Routine Engagement]  Contacted provider for referral to --  [facility] Consult & Neurology Consult]  Communication with PCP/Specialists  [Request Order for Palliative Care Consult & Neurology Consult]  Level of Care Adult Daycare, Assisted Living  [Confirmed Disinterest in Pursuing Higher Level of Care Placement Options or Adult Day Care Programs]  Applications Medicaid, Personal Care Services  [Confirmed Disinterest in Applying for Medicaid or Personal Care Services]  Education Interventions   Education Provided Provided Education  Provided Verbal Education On Mental Health/Coping with Illness, When to see the doctor, Applications, [employer], Human resources officer, Personal Care Services  [Confirmed Disinterest in Applying for [employer] or Personal Care Services]  Mental Health Interventions   Mental Health Discussed/Reviewed Anxiety, Mental Health Discussed, Depression, Mental Health Reviewed, Grief and Loss, Substance Abuse, Coping Strategies, Suicide, Crisis  [Assessed Mental Health of Patient & Caregiver/Wife, Jonathan Poole]  Safety Interventions   Safety Discussed/Reviewed Safety Discussed, Safety Reviewed, Fall Risk, Home Safety  [Encouraged Consideration of Home Safety Evaluation]  Journalist, newspaper, Refer for community resources  [Encouraged Consistent Use of Assistive Devices]  Advanced Directive Interventions   Advanced Directives Discussed/Reviewed Advanced Directives Discussed      Active Listening & Reflection Utilized. Verbalization of  Feelings Encouraged. Emotional  Support Provided. Solution-Focused Strategies Activated. Problem Solving Interventions Employed. Cognitive Behavioral Therapy Initiated. Acceptance & Commitment Therapy Performed. CSW Collaboration with Dr. Trena Platt, Primary Care Provider with [facility] Primary [facility] [phone]), Via Routed Note in Epic, to Request Order & Referral for Palliative Care Consult, Per Wife, Jonathan Poole's Request.   Encouraged Continued Engagement with In-Home Care Agencies, Services & Resources of Interest, from List Provided:  ~ Personal Care Service Providers ~ Home Health Care Agencies ~ In-Home Care & Respite Agencies ~ Respite Care Agencies & Facilities ~ Adult Day Care Programs Encouraged Continued Engagement with Support Group Agencies, Services & Resources of Interest, from List Provided: ~ Alzheimers.gov https://www.alzheimers.gov ~ Alzheimer's & Dementia Resources  Alzheimer's Association BuyingShow.uy  ~ Alzheimer's Foundation of [place]  Resources for Alzheimer's and Dementia Caregivers https://alzfdn.org/caregiving-resources ~ Resources for Patients and Caregivers  Department of Neurology LoyaltyUs.is ~ Dementia Capable [place]  NCDHHS https://www.lawson-evans.com/  ~ Resources  NC[employer]rain TripMetro.[email or web address]  Encouraged Continued Engagement with [employer], Services & Resources of Interest, from List Provided: ~ Economist in [place] ~ Mental Health Resources ~ Referrals Chart for Group Programs ~ Marriage, Relationship, Individual & Family Counseling Services ~ Psychiatrists in [place], [place] & [place] ~ Support Groups in [place] CSW Collaboration with Wife, Jonathan Poole to Encourage Engagement with Danford Bad, Licensed  Clinical Social Worker with [facility] [phone]), if She Has Questions, Needs Assistance, or If Additional Social Work Needs Are Identified Between Now & Our Next Follow-Up Outreach Call, Scheduled on 01/20/2023 at 10:15 AM.        SDOH assessments and interventions completed:  No     Care Coordination Interventions:  Yes, provided   Follow up plan: No further intervention required.   Encounter Outcome:  Patient Visit Completed

## 2022-12-30 NOTE — Patient Outreach (Signed)
Care Coordination   Follow Up Visit Note   12/30/2022  Name: CLIFF ROTHFELD MRN: [id number] DOB: 1955-09-30  Pearletha Furl Damiani is a 67 y.o. year old male who sees No primary care provider on file. for primary care. I spoke with patient's wife, Asim Abad by phone today.  What matters to the patients health and wellness today?  Obtain Counseling, Supportive Services & Resources.     Goals Addressed             This Visit's Progress    Obtain Counseling, Supportive Services & Resources.   On track    Care Coordination Interventions:  Interventions Today    Flowsheet Row Most Recent Value  Chronic Disease   Chronic disease during today's visit Other  [Alzheimer's Dementia with Behavioral Disturbance, Caregiver Burnout, Stress & Fatigue, Inability to Perform Activities of Daily Living Independently, Inappropriate & Argumentative Behavior, Shuffling Feet, Unsteady Balance/Gait]  General Interventions   General Interventions Discussed/Reviewed General Interventions Discussed, General Interventions Reviewed, Durable Medical Equipment (DME), Community Resources, Communication with, Level of Care, Doctor Visits  [Communication with Care Team Members & Primary Care Provider]  Doctor Visits Discussed/Reviewed Doctor Visits Discussed, Specialist, Doctor Visits Reviewed, Annual Wellness Visits, PCP  [Encouraged Routine Engagement]  Horticulturist, commercial (DME) BP Cuff, Environmental consultant, Tour manager, Other  [Prescription Eyeglasses, Manufacturing systems engineer, Engineer, materials in [place]ower]  PCP/Specialist Visits Compliance with follow-up visit  [Encouraged Routine Engagement]  Contacted provider for referral to --  [facility] Consult & Neurology Consult]  Communication with PCP/Specialists  [Request Order for Palliative Care Consult & Neurology Consult]  Level of Care Adult Daycare, Assisted Living  [Confirmed Disinterest in Pursuing Higher Level of Care Placement Options or Adult Day Care Programs]   Applications Medicaid, Personal Care Services  [Confirmed Disinterest in Applying for Medicaid or Personal Care Services]  Education Interventions   Education Provided Provided Education  Provided Verbal Education On Mental Health/Coping with Illness, When to see the doctor, Applications, [employer], Human resources officer, Personal Care Services  [Confirmed Disinterest in Applying for [employer] or Personal Care Services]  Mental Health Interventions   Mental Health Discussed/Reviewed Anxiety, Mental Health Discussed, Depression, Mental Health Reviewed, Grief and Loss, Substance Abuse, Coping Strategies, Suicide, Crisis  [Assessed Mental Health of Patient & Caregiver/Wife, [name]]  Safety Interventions   Safety Discussed/Reviewed Safety Discussed, Safety Reviewed, Fall Risk, Home Safety  [Encouraged Consideration of Home Safety Evaluation]  Journalist, newspaper, Refer for community resources  [Encouraged Consistent Use of Assistive Devices]  Advanced Directive Interventions   Advanced Directives Discussed/Reviewed Advanced Directives Discussed      Active Listening & Reflection Utilized. Verbalization of Feelings Encouraged. Emotional Support Provided. Solution-Focused Strategies Activated. Problem Solving Interventions Employed. Cognitive Behavioral Therapy Initiated. Acceptance & Commitment Therapy Performed. CSW Collaboration with Dr. Trena Platt, Primary Care Provider with [facility] [phone]), Via Routed Note in Epic, to Request Order & Referral for Palliative Care Consult, Per Wife, Darl Pikes [place]'s Request.   Encouraged Continued Engagement with In-Home Care Agencies, Services & Resources of Interest, from List Provided:  ~ Personal Care Service Providers ~ Home Health Care Agencies ~ In-Home Care & Respite Agencies ~ Respite Care Agencies & Facilities ~ Adult Day Care Programs Encouraged Continued Engagement  with Support Group Agencies, Services & Resources of Interest, from List Provided: ~ Alzheimers.gov https://www.alzheimers.gov ~ Alzheimer's & Dementia Resources  Alzheimer's Association BuyingShow.uy  ~ Alzheimer's Foundation of [place]  Resources for [employer] and  Dementia Caregivers https://alzfdn.org/caregiving-resources ~ Resources for Patients and Caregivers  Department of Neurology LoyaltyUs.is ~ Dementia Capable [place]  NCDHHS https://www.lawson-evans.com/  ~ Resources  NCBrain TripMetro.[email or web address]  Encouraged Continued Engagement with [employer], Services & Resources of Interest, from List Provided: ~ Economist in [place] ~ Mental Health Resources ~ Referrals Chart for Group Programs ~ Marriage, Relationship, Individual & Family Counseling Services ~ Psychiatrists in [place], [place] & [place] ~ Support Groups in [place] CSW Collaboration with Wife, Darl Pikes [place] to Encourage Engagement with Danford Bad, Licensed Clinical Social Worker with [facility] [phone]), if She Has Questions, Needs Assistance, or If Additional Social Work Needs Are Identified Between Now & Our Next Follow-Up Outreach Call, Scheduled on 01/20/2023 at 10:15 AM.        SDOH assessments and interventions completed:  Yes.  SDOH Interventions Today    Flowsheet Row Most Recent Value  SDOH Interventions   Food Insecurity Interventions Intervention Not Indicated  Housing Interventions Intervention Not Indicated  Transportation Interventions Intervention Not Indicated, Patient Resources (Friends/Family)  Utilities Interventions Intervention Not Indicated  Alcohol Usage Interventions Intervention Not Indicated (Score <7)  Financial Strain Interventions Intervention Not  Indicated  Physical Activity Interventions Patient Declined  Stress Interventions Intervention Not Indicated  Social Connections Interventions Intervention Not Indicated  Health Literacy Interventions Intervention Not Indicated     Care Coordination Interventions:  Yes, provided.   Follow up plan: Follow up call scheduled for 01/20/2023 at 10:15 am.  Encounter Outcome:  Patient Visit Completed.   Danford Bad, BSW, MSW, Printmaker Social Work Case Set designer Health  [facility], Population Health Direct Dial: [phone]  Fax: [phone] Email: [email or web address]

## 2022-12-30 NOTE — Patient Instructions (Signed)
Visit Information  Thank you for taking time to visit with me today. Please don't hesitate to contact me if I can be of assistance to you.   Following are the goals we discussed today:  Patient wife will connect to community resources for additional supportive services to provide counseling to patient.  If you are experiencing a Mental Health or Behavioral Health Crisis or need someone to talk to, please call 911  Patient verbalizes understanding of instructions and care plan provided today and agrees to view in MyChart. Active MyChart status and patient understanding of how to access instructions and care plan via MyChart confirmed with patient.     No further follow up required: Patient does not request an additional visit.  Lysle Morales, BSW Social Worker [phone]

## 2023-01-05 ENCOUNTER — Telehealth: Payer: Self-pay

## 2023-01-05 ENCOUNTER — Other Ambulatory Visit: Payer: Medicare HMO | Admitting: *Deleted

## 2023-01-05 NOTE — Telephone Encounter (Signed)
Copied from CRM [id number]. Topic: General - Other >> Jan 05, 2023 12:06 PM Dondra Prader A wrote: Reason for CRM: Patient would like to schedule and appointment, but it shows patient is dismissed from practice. Darl Pikes call back number is P: [phone]

## 2023-01-05 NOTE — Telephone Encounter (Signed)
Patient has been dismissed , has to find new pcp

## 2023-01-06 ENCOUNTER — Ambulatory Visit: Payer: Self-pay | Admitting: *Deleted

## 2023-01-06 NOTE — Patient Outreach (Signed)
  Care Coordination   01/06/2023 Name: Jonathan Poole MRN: [id number] DOB: 12-27-1955   Care Coordination Outreach Attempts:  An unsuccessful telephone outreach was attempted for a scheduled appointment today.  Follow Up Plan:  Additional outreach attempts will be made to offer the patient care coordination information and services.   Encounter Outcome:  No Answer   Care Coordination Interventions:  No, not indicated    Rinda Rollyson L. [name], RN, BSN, [facility]  VBCI Care Management Coordinator  [phone]  Fax: [phone]

## 2023-01-06 NOTE — Patient Outreach (Signed)
Care Coordination   Initial Visit Note   01/07/2023 Name: TONG MURK MRN: [id number] DOB: 1955/08/14  Pearletha Furl Cuthrell is a 67 y.o. year old male who sees No primary care provider on file. for primary care. I spoke with  Markes A Bigley by phone today.  What matters to the patients health and wellness today?  NO Pcp -dismissed from recent practice, wife not understanding why, Voices she is aware of 2 missed appointments only. States she did not receive a letter from the office and able to state patient had not been seen since 2023.  Worsening symptoms - "not doing well stares,  sleeping more, headache, no fever, poor mobility does not use device, headache Wife has not checked the blood pressure, or any other vital signs even after discussed  Able to hold arms up for over 30 seconds, no drooling, denies any weaknesses nor drooping of face, Not jerking when staring, he reports he is aware when he is staring but does not know why & wife states he responds to her during this time but may be delayed, PMH dementia  Wife voices understanding of why RN CM asked BE FAST and seizure symptoms but patient noted to become resistant to answering questions and asking "why?"  Wife voices understanding of the importance of seeking emergency services if symptoms worsen.     Goals Addressed               This Visit's Progress     Patient Stated     Coping Skills Enhanced. Manage memory issues. Manage pain issues (pt-stated)        duplicate      Other     CCM (DEMENTIA) EXPECTED OUTCOME: MONITOR, SELF-MANAGE AND REDUCE SYMPTOMS OF DEMENTIA        Current Barriers:  Knowledge Deficits related to Alzheimer's dementia management, resources Care Coordination needs related to caregiver, dementia resources in a patient with Alzheimer's dementia Chronic Disease Management support and education needs related to Alzheimer's dementia Cognitive Deficits No Advanced Directives in place-  documents previously mailed Spoke with patient and spouse who report pt lives with spouse and adult son, pt is usually independent with ADL's and continues to drive but not alone, spouse states pt is forgetful most of the time and this is getting worse, pt recognizes family members, has agitation, verbal outbursts and anger that is getting worse, pt was supposed to see primary care provider on 08/26/22 to discuss medication for agitation and did not go, has not rescheduled but plans to, social worker continues to work with patient and spouse states " this has been very helpful" Interventions Today    Flowsheet Row Most Recent Value  Chronic Disease   Chronic disease during today's visit Other  [Dementia, pcp, worsening symptoms]  General Interventions   General Interventions Discussed/Reviewed General Interventions Discussed, General Interventions Reviewed, Durable Medical Equipment (DME), Community Resources, Doctor Visits, Communication with  Doctor Visits Discussed/Reviewed Doctor Visits Discussed, Doctor Visits Reviewed, PCP, Specialist  PCP/Specialist Visits Compliance with follow-up visit  [poor follow up, missed 2 office visits, dismissed from [employer]. review of insurance site to find list of available pcps accepting new patients. Left message updating wife on offices at [facility] in [place]  Communication with PCP/Specialists  [spoke with previous pcp/confirmed dismissal reason]  Exercise Interventions   Exercise Discussed/Reviewed Exercise Discussed, Physical Activity  Physical Activity Discussed/Reviewed Physical Activity Discussed  [wife states he is active in the home.]  Education Interventions  Education Provided Provided Education  [employer] dismissal policies, stroke & seizure symptoms]  Provided Verbal Education On Mental Health/Coping with Illness, Exercise, When to see the doctor, Sick Day Rules, [employer], Development worker, community  Mental Health Interventions    Mental Health Discussed/Reviewed Mental Health Reviewed, Coping Strategies  Production manager with Social worker]  Pharmacy Interventions   Pharmacy Dicussed/Reviewed Pharmacy Topics Discussed, Medications and their functions, Affording Medications  Safety Interventions   Safety Discussed/Reviewed Safety Discussed, Fall Risk, Home Safety  Home Safety Assistive Devices  [encouraged use of assistive devices]            COMPLETED: CCM Expected Outcome:  Monitor, Self-Manage and Reduce Symptoms of:        Duplicate         SDOH assessments and interventions completed:  Yes     Care Coordination Interventions:  Yes, provided   Follow up plan: Follow up call scheduled for 01/07/23    Encounter Outcome:  Patient Visit Completed   Cala Bradford L. [name], RN, BSN, [facility]  VBCI Care Management Coordinator  [phone]  Fax: [phone]

## 2023-01-06 NOTE — Patient Outreach (Signed)
  Care Coordination   01/06/2023 Name: Jonathan Poole MRN: [id number] DOB: Aug 06, 1955   Care Coordination Outreach Attempts:  An unsuccessful telephone outreach was attempted for a scheduled appointment today.  Follow Up Plan:  Additional outreach attempts will be made to offer the patient care coordination information and services.   Encounter Outcome:  No Answer   Care Coordination Interventions:  No, not indicated    [name] L. [name], RN, BSN, [facility]  VBCI Care Management Coordinator  [phone]  Fax: [phone]

## 2023-01-07 ENCOUNTER — Encounter: Payer: Self-pay | Admitting: Internal Medicine

## 2023-01-07 ENCOUNTER — Ambulatory Visit: Payer: Self-pay | Admitting: *Deleted

## 2023-01-07 NOTE — Patient Outreach (Signed)
  Care Coordination   01/07/2023 Name: Jonathan Poole MRN: [id number] DOB: [birth year]   Care Coordination Outreach Attempts:  An unsuccessful telephone outreach was attempted today to offer the patient information about available care coordination services.  Follow Up Plan:  Additional outreach attempts will be made to offer the patient care coordination information and services.   Encounter Outcome:  No Answer   Care Coordination Interventions:  Yes, provided Made several attempts to reach wife/patient today Will try again 01/13/23    Cala Bradford L. [name], RN, BSN, [facility]  VBCI Care Management Coordinator  [phone]  Fax: [phone]

## 2023-01-07 NOTE — Patient Outreach (Signed)
  Care Coordination   Follow Up Visit Note   01/07/2023 Name: Jonathan Poole MRN: [id number] DOB: 1955-07-08  Jonathan Poole is a 67 y.o. year old male who sees No primary care provider on file. for primary care. I  chatted with previous pcp  What matters to the patients health and wellness today?  Pcp services- confirmed to be dismissed from previous practice. Letter to be sent out by office staff. Wife did not answer during the initial & second attempts to reach her today. Pending a possible return call back   Goals Addressed   None     SDOH assessments and interventions completed:  No     Care Coordination Interventions:  Yes, provided   Follow up plan: Follow up call scheduled for 01/13/23    Encounter Outcome:  Patient Visit Completed   Cala Bradford L. [name], RN, BSN, [facility]  VBCI Care Management Coordinator  [phone]  Fax: [phone]

## 2023-01-07 NOTE — Patient Instructions (Signed)
Visit Information  Thank you for taking time to visit with me today. Please don't hesitate to contact me if I can be of assistance to you.   Following are the goals we discussed today:   Goals Addressed               This Visit's Progress     Patient Stated     Coping Skills Enhanced. Manage memory issues. Manage pain issues (pt-stated)        duplicate      Other     CCM (DEMENTIA) EXPECTED OUTCOME: MONITOR, SELF-MANAGE AND REDUCE SYMPTOMS OF DEMENTIA        Current Barriers:  Knowledge Deficits related to Alzheimer's dementia management, resources Care Coordination needs related to caregiver, dementia resources in a patient with Alzheimer's dementia Chronic Disease Management support and education needs related to Alzheimer's dementia Cognitive Deficits No Advanced Directives in place- documents previously mailed Spoke with patient and spouse who report pt lives with spouse and adult son, pt is usually independent with ADL's and continues to drive but not alone, spouse states pt is forgetful most of the time and this is getting worse, pt recognizes family members, has agitation, verbal outbursts and anger that is getting worse, pt was supposed to see primary care provider on 08/26/22 to discuss medication for agitation and did not go, has not rescheduled but plans to, social worker continues to work with patient and spouse states " this has been very helpful" Interventions Today    Flowsheet Row Most Recent Value  Chronic Disease   Chronic disease during today's visit Other  [Dementia, pcp, worsening symptoms]  General Interventions   General Interventions Discussed/Reviewed General Interventions Discussed, General Interventions Reviewed, Durable Medical Equipment (DME), Community Resources, Doctor Visits, Communication with  Doctor Visits Discussed/Reviewed Doctor Visits Discussed, Doctor Visits Reviewed, PCP, Specialist  PCP/Specialist Visits Compliance with follow-up visit   [poor follow up, missed 2 office visits, dismissed from [employer]. review of insurance site to find list of available pcps accepting new patients. Left message updating wife on offices at [facility] in [place]  Communication with PCP/Specialists  [spoke with previous pcp/confirmed dismissal reason]  Exercise Interventions   Exercise Discussed/Reviewed Exercise Discussed, Physical Activity  Physical Activity Discussed/Reviewed Physical Activity Discussed  [wife states he is active in the home.]  Education Interventions   Education Provided Provided Education  [office dismissal policies, stroke & seizure symptoms]  Provided Verbal Education On Mental Health/Coping with Illness, Exercise, When to see the doctor, Sick Day Rules, [employer], Development worker, community  Mental Health Interventions   Mental Health Discussed/Reviewed Mental Health Reviewed, Coping Strategies  Production manager with Social worker]  Pharmacy Interventions   Pharmacy Dicussed/Reviewed Pharmacy Topics Discussed, Medications and their functions, Affording Medications  Safety Interventions   Safety Discussed/Reviewed Safety Discussed, Fall Risk, Home Safety  Home Safety Assistive Devices  [encouraged use of assistive devices]            COMPLETED: CCM Expected Outcome:  Monitor, Self-Manage and Reduce Symptoms of:        Duplicate         Our next appointment is by telephone on 01/07/23 at [phone]  Please call the care guide team at [phone] if you need to cancel or reschedule your appointment.   If you are experiencing a Mental Health or Behavioral Health Crisis or need someone to talk to, please call the Suicide and Crisis Lifeline: 988 call the [place] National Suicide Prevention Lifeline: 972-869-6174  or TTY: 825-096-1226 TTY (650)802-5292) to talk to a trained counselor call 1-800-273-TALK (toll free, 24 hour hotline) go to [facility] [address] [place]  [phone]) call the [facility] Line: [phone] call 911   Patient verbalizes understanding of instructions and care plan provided today and agrees to view in MyChart. Active MyChart status and patient understanding of how to access instructions and care plan via MyChart confirmed with patient.     The patient has been provided with contact information for the care management team and has been advised to call with any health related questions or concerns.    Tihanna Goodson L. [name], RN, BSN, [facility]  VBCI Care Management Coordinator  [phone]  Fax: [phone]

## 2023-01-13 ENCOUNTER — Ambulatory Visit: Payer: Self-pay | Admitting: *Deleted

## 2023-01-13 NOTE — Patient Instructions (Signed)
Visit Information  Thank you for taking time to visit with me today. Please don't hesitate to contact me if I can be of assistance to you.   Following are the goals we discussed today:   Goals Addressed             This Visit's Progress    CCM (DEMENTIA) EXPECTED OUTCOME: MONITOR, SELF-MANAGE AND REDUCE SYMPTOMS OF DEMENTIA   Not on track    Current Barriers:  Knowledge Deficits related to Alzheimer's dementia management, resources Care Coordination needs related to caregiver, dementia resources in a patient with Alzheimer's dementia Chronic Disease Management support and education needs related to Alzheimer's dementia Cognitive Deficits No Advanced Directives in place- documents previously mailed Spoke with patient and spouse who report pt lives with spouse and adult son, pt is usually independent with ADL's 01/13/23 Wife reports he does not drive,  spouse states pt is forgetful most of the time and this is getting worse, pt recognizes family members, has agitation, verbal outbursts and anger that is getting worse, pt was supposed to see primary care provider on 08/26/22 to discuss medication for agitation and did not go, has not rescheduled but plans to, social worker continues to work with patient and spouse states " this has been very helpful" Interventions Today    Flowsheet Row Most Recent Value  Chronic Disease   Chronic disease during today's visit Other  [dementia, pcp services, letter from [facility],]  General Interventions   General Interventions Discussed/Reviewed General Interventions Reviewed, Doctor Visits, Community Resources  Doctor Visits Discussed/Reviewed Doctor Visits Reviewed, PCP  PCP/Specialist Visits Contact provider for referral to  [spoke with Noreene Larsson & sent email to [place] at Dr Margo Aye office (accepting but form & 2 week response) , Spoke with Toniann Fail at Dr Ouida Sills office (not accepting)]  Communication with PCP/Specialists  Education Interventions   Education Provided  Provided Education  [pcp office dismissal policy, new pcp via [employer] site, update on called providers in [place]]              Our next appointment is by telephone on 01/15/23 at 1130  Please call the care guide team at [phone] if you need to cancel or reschedule your appointment.   If you are experiencing a Mental Health or Behavioral Health Crisis or need someone to talk to, please call the Suicide and Crisis Lifeline: 988 call the [place] National Suicide Prevention Lifeline: (778)011-7696 or TTY: 916-175-8979 TTY (678)417-6781) to talk to a trained counselor call 1-800-273-TALK (toll free, 24 hour hotline) call the [facility]: 425-577-3294 call 911   Patient verbalizes understanding of instructions and care plan provided today and agrees to view in MyChart. Active MyChart status and patient understanding of how to access instructions and care plan via MyChart confirmed with patient.     The patient has been provided with contact information for the care management team and has been advised to call with any health related questions or concerns.   Ariell Gunnels L. [name], RN, BSN, [facility]  VBCI Care Management Coordinator  [phone]  Fax: [phone]

## 2023-01-13 NOTE — Patient Outreach (Signed)
Care Coordination   Follow Up Visit Note   01/13/2023 Name: Jonathan Poole MRN: [id number] DOB: 1955-09-30  Jonathan Poole is a 67 y.o. year old male who sees No primary care provider on file. for primary care. I spoke with  Jonathan Poole by phone today.  What matters to the patients health and wellness today?  New accepting pcp using [employer] ID number  Wife reports the patient is without changes since the 01/06/23 outreach but has not sought medical services as recommended    On 01/06/23 wife reported Worsening symptoms - "not doing well stares,  sleeping more, headache, no fever, poor mobility does not use device, headache    Wife inquired about other [facility] providers. She voiced understanding that [facility] medicare site listed in network providers. She voiced understanding that RN CM would try the site again today to see if there are other providers. She was able to verify the patient's [employer] ID (compared to the card scanned in [facility]) to do today's search using it vs his insurance plan name Wife requested a call back during the RN CM search process.  1237 01/13/23 RN CM had an unsuccessful call attempt to wife. Message updated left that Dr Margo Aye office is accepting the [facility] coverage, requests completion of a new patient form and has a 2 week waiting response    Goals Addressed             This Visit's Progress    CCM (DEMENTIA) EXPECTED OUTCOME: MONITOR, SELF-MANAGE AND REDUCE SYMPTOMS OF DEMENTIA   Not on track    Current Barriers:  Knowledge Deficits related to Alzheimer's dementia management, resources Care Coordination needs related to caregiver, dementia resources in a patient with Alzheimer's dementia Chronic Disease Management support and education needs related to Alzheimer's dementia Cognitive Deficits No Advanced Directives in place- documents previously mailed Spoke with patient and spouse who report pt lives with spouse and adult son,  pt is usually independent with ADL's 01/13/23 Wife reports he does not drive,  spouse states pt is forgetful most of the time and this is getting worse, pt recognizes family members, has agitation, verbal outbursts and anger that is getting worse, pt was supposed to see primary care provider on 08/26/22 to discuss medication for agitation and did not go, has not rescheduled but plans to, social worker continues to work with patient and spouse states " this has been very helpful" Interventions Today    Flowsheet Row Most Recent Value  Chronic Disease   Chronic disease during today's visit Other  [dementia, pcp services, letter from [facility],]  General Interventions   General Interventions Discussed/Reviewed General Interventions Reviewed, Doctor Visits, Community Resources  Doctor Visits Discussed/Reviewed Doctor Visits Reviewed, PCP  PCP/Specialist Visits Contact provider for referral to  [spoke with Jonathan Poole & sent email to [place] at Dr Margo Aye office (accepting but form & 2 week response) , Spoke with Jonathan Poole at Dr Ouida Sills office (not accepting)]  Communication with PCP/Specialists  Education Interventions   Education Provided Provided Education  [pcp office dismissal policy, new pcp via [employer] site, update on called providers in [place]]              SDOH assessments and interventions completed:  Yes     Care Coordination Interventions:  Yes, provided   Follow up plan: Follow up call scheduled for 01/15/23 1130    Encounter Outcome:  Patient Visit Completed   Jonathan Bradford L. [name], RN, BSN, CCM  [facility]  Care Management Coordinator  [phone]  Fax: [phone]

## 2023-01-15 ENCOUNTER — Ambulatory Visit: Payer: Self-pay | Admitting: *Deleted

## 2023-01-15 NOTE — Patient Outreach (Addendum)
  Care Coordination   Follow Up Visit Note   01/15/2023 Name: Jonathan Poole MRN: [id number] DOB: 1955-07-27  Jonathan Poole is a 67 y.o. year old male who sees No primary care provider on file. for primary care. I spoke with Jonathan Poole wife of Jonathan Poole by phone today.  What matters to the patients health and wellness today?  New pcp process Wife reports the patient is sleeping at the time of this outreach. Reports he sleeps late most days.   Wife voiced understanding to report to a medical provider if patient begins to sleep for  longer than 12-18 hours Wife confirmed she received the message left by RN CM a few days ago She confirms she will stop by Dr Margo Aye office to get forms Wife reports preparing to go with son to an appointment. She reports she will call RN CM back     Goals Addressed               This Visit's Progress     Patient Stated     Coping Skills Enhanced. Manage memory issues. Manage pain issues (pt-stated)   On track     duplicate      Other     CCM (DEMENTIA) EXPECTED OUTCOME: MONITOR, SELF-MANAGE AND REDUCE SYMPTOMS OF DEMENTIA   On track     Current Barriers:  Knowledge Deficits related to Alzheimer's dementia management, resources Care Coordination needs related to caregiver, dementia resources in a patient with Alzheimer's dementia Chronic Disease Management support and education needs related to Alzheimer's dementia Cognitive Deficits No Advanced Directives in place- documents previously mailed Spoke with patient and spouse who report pt lives with spouse and adult son, pt is usually independent with ADL's 01/13/23 Wife reports he does not drive,  spouse states pt is forgetful most of the time and this is getting worse, pt recognizes family members, has agitation, verbal outbursts and anger that is getting worse, pt was supposed to see primary care provider on 08/26/22 to discuss medication for agitation and did not go, wife did not reschedule,  01/07/23 letter of dismissal from [facility], social worker continues to work with patient and spouse states " this has been very helpful" 01/15/23 Each outreach patient reported by his wife to be "doing about the same" Patient has not been taken to urgent care or ED as of today Interventions Today    Flowsheet Row Most Recent Value  Chronic Disease   Chronic disease during today's visit Other  [new pcp process, worsening symptoms, Wife reports Patient is about the same, agreed to have the wife return a call to RN CM as needed]  General Interventions   General Interventions Discussed/Reviewed General Interventions Reviewed, Doctor Visits  Doctor Visits Discussed/Reviewed PCP, Doctor Visits Reviewed  [Reviewed the message left related to Dr Margo Aye office's new patient process & need for wife to pick up new patient forms]  Education Interventions   Education Provided Provided Education  Provided Verbal Education On Other  [Dr Margo Aye new patient process/forms]              SDOH assessments and interventions completed:  No     Care Coordination Interventions:  Yes, provided   Follow up plan: Follow up call scheduled for 02/17/23    Encounter Outcome:  Patient Visit Completed   Cala Bradford L. [name], RN, BSN, [facility]  VBCI Care Management Coordinator  [phone]  Fax: [phone]

## 2023-01-15 NOTE — Patient Instructions (Addendum)
Visit Information  Thank you for taking time to visit with me today. Please don't hesitate to contact me if I can be of assistance to you.   Following are the goals we discussed today:   Goals Addressed               This Visit's Progress     Patient Stated     Coping Skills Enhanced. Manage memory issues. Manage pain issues (pt-stated)   On track     duplicate      Other     CCM (DEMENTIA) EXPECTED OUTCOME: MONITOR, SELF-MANAGE AND REDUCE SYMPTOMS OF DEMENTIA   On track     Current Barriers:  Knowledge Deficits related to Alzheimer's dementia management, resources Care Coordination needs related to caregiver, dementia resources in a patient with Alzheimer's dementia Chronic Disease Management support and education needs related to Alzheimer's dementia Cognitive Deficits No Advanced Directives in place- documents previously mailed Spoke with patient and spouse who report pt lives with spouse and adult son, pt is usually independent with ADL's 01/13/23 Wife reports he does not drive,  spouse states pt is forgetful most of the time and this is getting worse, pt recognizes family members, has agitation, verbal outbursts and anger that is getting worse, pt was supposed to see primary care provider on 08/26/22 to discuss medication for agitation and did not go, wife did not reschedule, 01/07/23 letter of dismissal from [facility], social worker continues to work with patient and spouse states " this has been very helpful" 01/15/23 Each outreach patient reported by his wife to be "doing about the same" Patient has not been taken to urgent care or ED as of today Interventions Today    Flowsheet Row Most Recent Value  Chronic Disease   Chronic disease during today's visit Other  [new pcp process, worsening symptoms, Wife reports Patient is about the same, agreed to have the wife return a call to RN CM as needed]  General Interventions   General Interventions Discussed/Reviewed General Interventions  Reviewed, Doctor Visits  Doctor Visits Discussed/Reviewed PCP, Doctor Visits Reviewed  [Reviewed the message left related to Dr Margo Aye office's new patient process & need for wife to pick up new patient forms]  Education Interventions   Education Provided Provided Education  Provided Verbal Education On Other  [Dr Margo Aye new patient process/forms]              Our next appointment is by telephone on 02/17/23 at 1 pm  Please call the care guide team at [phone] if you need to cancel or reschedule your appointment.   If you are experiencing a Mental Health or Behavioral Health Crisis or need someone to talk to, please call the Suicide and Crisis Lifeline: 988 call the [place] National Suicide Prevention Lifeline: (952) 403-1819 or TTY: 367-323-1826 TTY 970 100 4001) to talk to a trained counselor call 1-800-273-TALK (toll free, 24 hour hotline) call the [facility]: [phone]   Patient verbalizes understanding of instructions and care plan provided today and agrees to view in MyChart. Active MyChart status and patient understanding of how to access instructions and care plan via MyChart confirmed with patient.     The patient has been provided with contact information for the care management team and has been advised to call with any health related questions or concerns.   Willman Cuny L. [name], RN, BSN, [place]  VBCI Care Management Coordinator  [phone]  Fax: [phone]

## 2023-01-20 ENCOUNTER — Ambulatory Visit: Payer: Self-pay | Admitting: *Deleted

## 2023-01-20 NOTE — Patient Outreach (Signed)
  Care Coordination   01/20/2023  Name: Jonathan Poole MRN: [id number] DOB: 06/27/1955   Care Coordination Outreach Attempts:  An unsuccessful telephone outreach was attempted today to offer the patient information about available care coordination services. HIPAA compliant messages left on voicemail providing contact information for CSW, encouraging patient's wife, Jonathan Poole to return CSW's call at her earliest convenience.  Follow Up Plan:  Additional outreach attempts will be made to offer the patient care coordination information and services.   Encounter Outcome:  No Answer.   Care Coordination Interventions:  No, not indicated.    Danford Bad, BSW, MSW, Printmaker Social Work Case Set designer Health  [facility], Population Health Direct Dial: [phone]  Fax: [phone] Email: [email or web address]: [email or web address]

## 2023-01-23 ENCOUNTER — Telehealth: Payer: Self-pay | Admitting: Internal Medicine

## 2023-01-23 NOTE — Telephone Encounter (Signed)
Patient calling the office for samples of medication:   1.  What medication and dosage are you requesting samples for? Eliquis  2.  Are you currently out of this medication? Patient does not have money to pay for his Eliquis

## 2023-01-26 NOTE — Telephone Encounter (Signed)
You are correct about Pradaxa being the last prescribed anticoagulant.  Please call pt and confirm.  Looks like he has tried Xarelto and Eliquis and can't afford.  That's why he was changed to Pradaxa. Do you have any Pradaxa samples?  If not, he will have to decide what he wants to do since we will not be able to provide samples long term.

## 2023-01-26 NOTE — Progress Notes (Signed)
Remote pacemaker transmission.   

## 2023-01-27 NOTE — Telephone Encounter (Signed)
Left a message for patient to call office back regarding Pradaxa/Eliquis.

## 2023-01-28 ENCOUNTER — Telehealth: Payer: Self-pay | Admitting: *Deleted

## 2023-01-28 ENCOUNTER — Encounter: Payer: Medicare HMO | Admitting: Internal Medicine

## 2023-01-28 NOTE — Progress Notes (Signed)
  Care Coordination Note  01/28/2023 Name: Jonathan Poole MRN: [id number] DOB: March 28, 1955  Jonathan Poole is a 67 y.o. year old male who is a primary care patient of No primary care provider on file. and is actively engaged with the care management team. I reached out to Matai A Paulson by phone today to assist with re-scheduling a follow up visit with the Licensed Clinical Social Worker  Follow up plan: Unsuccessful telephone outreach attempt made. A HIPAA compliant phone message was left for the patient providing contact information and requesting a return call.   [facility]  Care Coordination Care Guide  Direct Dial: [phone]

## 2023-01-29 ENCOUNTER — Ambulatory Visit: Payer: Medicare HMO | Admitting: *Deleted

## 2023-01-29 NOTE — Patient Outreach (Signed)
Care Coordination   Follow Up Visit Note   01/29/2023  Name: Jonathan Poole MRN: [id number] DOB: 1955-03-29  Pearletha Furl Kope is a 67 y.o. year old male who sees No primary care provider on file. for primary care. I spoke with patient's wife, Marquel Dent by phone today.  What matters to the patients health and wellness today?  Obtain Counseling, Supportive Services & Resources.    Goals Addressed             This Visit's Progress    Obtain Counseling, Supportive Services & Resources.   On track    Care Coordination Interventions:  Interventions Today    Flowsheet Row Most Recent Value  Chronic Disease   Chronic disease during today's visit Atrial Fibrillation (AFib), Other  [Alzheimer's/Dementia w/ Behavioral Disturbance, Caregiver Burnout/Stress/Fatigue, Inability to Perform Activities of Daily Living Independently, Inappropriate/Argumentative Behavior, Shuffling Feet, Unsteady Balance/Gait, Chronic Prescription Opiate Use.]  General Interventions   General Interventions Discussed/Reviewed General Interventions Discussed, General Interventions Reviewed, Annual Eye Exam, Labs, Annual Foot Exam, [employer], Health Screening, Vaccines, Horticulturist, commercial (DME), Doctor Visits, Communication with, Level of Care  [Encouraged Routine Engagement with Care Team Members.]  Labs Hgb A1c every 3 months, Kidney Function  [Encouraged Routine Labwork.]  Vaccines COVID-19, Flu, Pneumonia, RSV, Shingles, Tetanus/Pertussis/Diphtheria  [Encouraged Annual Vaccinations.]  Doctor Visits Discussed/Reviewed Doctor Visits Discussed, Doctor Visits Reviewed, Annual Wellness Visits, PCP, Specialist  [Encouraged Routine Engagement with Care Team Members.]  Health Screening Bone Density, Colonoscopy, Prostate  [Encouraged Annual Health Screenings.]  Durable Medical Equipment (DME) BP Cuff, Environmental consultant, Wheelchair, Tour manager, Other, Bed side commode  [Prescription Eyeglasses, Marketing executive, Engineer, materials in Shower.]  Wheelchair Standard  PCP/Specialist Visits Compliance with follow-up visit  [Encouraged Routine Engagement with Care Team Members.]  Communication with PCP/Specialists, Charity fundraiser, Pharmacists, Social Work  [employer] Routine Engagement with Care Team Members.]  Level of Care Adult Daycare, Air traffic controller, Assisted Living, Skilled Nursing Facility  [Confirmed Disinterest in Enrollment in Adult Day Care Program or Receiving Assistance Pursuing Higher Level of Care Placement Options (I.e Assisted Living Versus Skilled Nursing Facility).]  Applications Medicaid, Personal Care Services  [Confirmed Interest in Applying for Mediciaid & Personal Care Services, Mailing Applications & Offering to Assist with Completion.]  Exercise Interventions   Exercise Discussed/Reviewed Exercise Discussed, Exercise Reviewed, Assistive device use and maintanence, Weight Managment, Physical Activity  [Encouraged Daily Exercise Regimen, as Tolerated.]  Physical Activity Discussed/Reviewed Physical Activity Discussed, Physical Activity Reviewed, Types of exercise, Home Exercise Program (HEP), PREP, Gym  [Encouraged Increased Engagment in Activities of Interest, Inside & Outside the Home.]  Weight Management Weight loss  Education Interventions   Education Provided Provided Education  [Thoroughly Reviewed Educational Material to Ensure Understanding & Entertain Questions.]  Provided Verbal Education On Nutrition, Exercise, Medication, When to see the doctor, Mental Health/Coping with Illness, Development worker, community, Applications, [employer]  [employer] Review, Consideration & Implementation of Educational Material Reviewed.]  Ship broker, Personal Care Services  [employer] Interest in Applying for Mediciaid & Development worker, international aid, Teaching laboratory technician & Offering to Assist with Completion.]  Mental Health Interventions   Mental Health Discussed/Reviewed Mental Health Discussed, Substance  Abuse, Suicide, Other, Mental Health Reviewed, Coping Strategies, Crisis, Anxiety, Depression, Grief and Loss  [Assessed Mental Health & Cognitive Status.]  Nutrition Interventions   Nutrition Discussed/Reviewed Nutrition Discussed, Portion sizes, Decreasing sugar intake, Nutrition Reviewed, Increasing proteins, Carbohydrate meal planning, Decreasing fats, Adding fruits and vegetables, Decreasing salt, Fluid intake  [Encouraged Heart-Healthy, Low Fat, Reduced  Sugar, Low Sodium Diet.]  Pharmacy Interventions   Pharmacy Dicussed/Reviewed Pharmacy Topics Discussed, Affording Medications, Pharmacy Topics Reviewed, Medications and their functions, Medication Adherence  [Confirmed Ability to Afford Prescription Medications & Medication Compliance.]  Safety Interventions   Safety Discussed/Reviewed Safety Discussed, Safety Reviewed, Fall Risk, Home Safety  [Encouraged Consideration of Home Safety Evaluation.]  Home Safety Assistive Devices, Need for home safety assessment  [Encouraged Routine Use of Home Assistive Devices & Durable Medical Equipment.]  Advanced Directive Interventions   Advanced Directives Discussed/Reviewed Advanced Directives Discussed, Advanced Directives Reviewed  [Confirmed Initiation of Advanced Directives (Living Will & Healthcare Power of Attorney Documents), Requesting Copies to Scan into Electronic Medical Record in Epic.]      Active Listening & Reflection Utilized. Verbalization of Feelings Encouraged. Emotional Support Provided. Client-Centered Therapy Initiated. Cognitive Behavioral Therapy Indicated. Acceptance & Commitment Therapy Implemented. CSW Collaboration with Wife, Darl Pikes Ortman to Encourage Engagement with Danford Bad, Licensed Clinical Social Worker with [facility] [phone]), if She Has Questions, Needs Assistance, or If Additional Social Work Needs Are Identified Between Now & Our Next Follow-Up Outreach Call, Scheduled on 02/19/2023  at 11:45 AM.      SDOH assessments and interventions completed:  Yes.  Care Coordination Interventions:  Yes, provided.   Follow up plan: Follow up call scheduled for 02/19/2023 at 11:45 am.  Encounter Outcome:  Patient Visit Completed.   Danford Bad, BSW, MSW, Printmaker Social Work Case Set designer Health  [facility], Population Health Direct Dial: [phone]  Fax: [phone] Email: [email or web address] Website: [email or web address]

## 2023-01-29 NOTE — Patient Instructions (Signed)
Visit Information  Thank you for taking time to visit with me today. Please don't hesitate to contact me if I can be of assistance to you.   Following are the goals we discussed today:   Goals Addressed             This Visit's Progress    Obtain Counseling, Supportive Services & Resources.   On track    Care Coordination Interventions:  Interventions Today    Flowsheet Row Most Recent Value  Chronic Disease   Chronic disease during today's visit Atrial Fibrillation (AFib), Other  [Alzheimer's/Dementia w/ Behavioral Disturbance, Caregiver Burnout/Stress/Fatigue, Inability to Perform Activities of Daily Living Independently, Inappropriate/Argumentative Behavior, Shuffling Feet, Unsteady Balance/Gait, Chronic Prescription Opiate Use.]  General Interventions   General Interventions Discussed/Reviewed General Interventions Discussed, General Interventions Reviewed, Annual Eye Exam, Labs, Annual Foot Exam, [employer], Health Screening, Vaccines, Horticulturist, commercial (DME), Doctor Visits, Communication with, Level of Care  [Encouraged Routine Engagement with Care Team Members.]  Labs Hgb A1c every 3 months, Kidney Function  [Encouraged Routine Labwork.]  Vaccines COVID-19, Flu, Pneumonia, RSV, Shingles, Tetanus/Pertussis/Diphtheria  [Encouraged Annual Vaccinations.]  Doctor Visits Discussed/Reviewed Doctor Visits Discussed, Doctor Visits Reviewed, Annual Wellness Visits, PCP, Specialist  [Encouraged Routine Engagement with Care Team Members.]  Health Screening Bone Density, Colonoscopy, Prostate  [Encouraged Annual Health Screenings.]  Durable Medical Equipment (DME) BP Cuff, Environmental consultant, Wheelchair, Tour manager, Other, Bed side commode  [Prescription Eyeglasses, Manufacturing systems engineer, Engineer, materials in Shower.]  Wheelchair Standard  PCP/Specialist Visits Compliance with follow-up visit  [Encouraged Routine Engagement with Care Team Members.]  Communication with PCP/Specialists, Charity fundraiser,  Pharmacists, Social Work  [employer] Routine Engagement with Care Team Members.]  Level of Care Adult Daycare, Air traffic controller, Assisted Living, Skilled Nursing Facility  [Confirmed Disinterest in Enrollment in Adult Day Care Program or Receiving Assistance Pursuing Higher Level of Care Placement Options (I.e Assisted Living Versus Skilled Nursing Facility).]  Applications Medicaid, Personal Care Services  [Confirmed Interest in Applying for Mediciaid & Personal Care Services, Mailing Applications & Offering to Assist with Completion.]  Exercise Interventions   Exercise Discussed/Reviewed Exercise Discussed, Exercise Reviewed, Assistive device use and maintanence, Weight Managment, Physical Activity  [Encouraged Daily Exercise Regimen, as Tolerated.]  Physical Activity Discussed/Reviewed Physical Activity Discussed, Physical Activity Reviewed, Types of exercise, Home Exercise Program (HEP), PREP, Gym  [Encouraged Increased Engagment in Activities of Interest, Inside & Outside the Home.]  Weight Management Weight loss  Education Interventions   Education Provided Provided Education  [Thoroughly Reviewed Educational Material to Ensure Understanding & Entertain Questions.]  Provided Verbal Education On Nutrition, Exercise, Medication, When to see the doctor, Mental Health/Coping with Illness, Development worker, community, Applications, [employer]  [employer] Review, Consideration & Implementation of Educational Material Reviewed.]  Ship broker, Personal Care Services  [employer] Interest in Applying for Mediciaid & Development worker, international aid, Teaching laboratory technician & Offering to Assist with Completion.]  Mental Health Interventions   Mental Health Discussed/Reviewed Mental Health Discussed, Substance Abuse, Suicide, Other, Mental Health Reviewed, Coping Strategies, Crisis, Anxiety, Depression, Grief and Loss  [Assessed Mental Health & Cognitive Status.]  Nutrition Interventions   Nutrition  Discussed/Reviewed Nutrition Discussed, Portion sizes, Decreasing sugar intake, Nutrition Reviewed, Increasing proteins, Carbohydrate meal planning, Decreasing fats, Adding fruits and vegetables, Decreasing salt, Fluid intake  [Encouraged Heart-Healthy, Low Fat, Reduced Sugar, Low Sodium Diet.]  Pharmacy Interventions   Pharmacy Dicussed/Reviewed Pharmacy Topics Discussed, Affording Medications, Pharmacy Topics Reviewed, Medications and their functions, Medication Adherence  [Confirmed Ability to Afford Prescription Medications & Medication  Compliance.]  Safety Interventions   Safety Discussed/Reviewed Safety Discussed, Safety Reviewed, Fall Risk, Home Safety  [Encouraged Consideration of Home Safety Evaluation.]  Home Safety Assistive Devices, Need for home safety assessment  [Encouraged Routine Use of Home Assistive Devices & Durable Medical Equipment.]  Advanced Directive Interventions   Advanced Directives Discussed/Reviewed Advanced Directives Discussed, Advanced Directives Reviewed  [Confirmed Initiation of Advanced Directives (Living Will & Healthcare Power of Attorney Documents), Requesting Copies to Scan into Electronic Medical Record in Epic.]      Active Listening & Reflection Utilized. Verbalization of Feelings Encouraged. Emotional Support Provided. Client-Centered Therapy Initiated. Cognitive Behavioral Therapy Indicated. Acceptance & Commitment Therapy Implemented. CSW Collaboration with Wife, Darl Pikes Knipfer to Encourage Engagement with Danford Bad, Licensed Clinical Social Worker with [facility] [phone]), if She Has Questions, Needs Assistance, or If Additional Social Work Needs Are Identified Between Now & Our Next Follow-Up Outreach Call, Scheduled on 02/19/2023 at 11:45 AM.      Our next appointment is by telephone on 02/19/2023 at 11:45 am.  Please call the care guide team at [phone] if you need to cancel or reschedule your appointment.    If you are experiencing a Mental Health or Behavioral Health Crisis or need someone to talk to, please call the Suicide and Crisis Lifeline: 988 call the [place] National Suicide Prevention Lifeline: 581-013-2881 or TTY: (902) 180-0499 TTY 484 572 1286) to talk to a trained counselor call 1-800-273-TALK (toll free, 24 hour hotline) go to [facility] [address] [place] [phone]) call the [facility] Crisis Line: [phone] call 911  Patient verbalizes understanding of instructions and care plan provided today and agrees to view in MyChart. Active MyChart status and patient understanding of how to access instructions and care plan via MyChart confirmed with patient.     Telephone follow up appointment with care management team member scheduled for:   02/19/2023 at 11:45 am.  Danford Bad, BSW, MSW, LCSW  Embedded Practice Social Work Case Manager  [facility], Population Health Direct Dial: [phone]  Fax: [phone] Email: [email or web address] Website: [email or web address]

## 2023-01-29 NOTE — Telephone Encounter (Signed)
Jonathan Poole returned call

## 2023-01-29 NOTE — Telephone Encounter (Signed)
Left a message for patient to call office back regarding Pradaxa/Eliquis.

## 2023-01-30 ENCOUNTER — Telehealth: Payer: Self-pay | Admitting: Internal Medicine

## 2023-01-30 NOTE — Telephone Encounter (Signed)
Wife returned staff call.

## 2023-01-30 NOTE — Telephone Encounter (Signed)
Please see previous note.

## 2023-01-30 NOTE — Telephone Encounter (Signed)
Spoke to pt's wife who stated that pt never picked up Pradaxa from his pharmacy as it was unaffordable. Pt's wife stated that Eliquis was affordable until pt went into the donut hole but since the new year is starting and the coverage for Medicare is changing, she believes this will be a better fit for pt. Pt's wife is asking for pt to be switched back to Eliquis.   Please advise.

## 2023-01-30 NOTE — Telephone Encounter (Signed)
 Left a message for patient to call office back regarding Pradaxa/Eliquis.

## 2023-02-05 NOTE — Telephone Encounter (Signed)
That is fine. Perhaps she can come by the office for a week of samples to help to protect her until after the first of the year.

## 2023-02-09 MED ORDER — APIXABAN 5 MG PO TABS: 5.0000 mg | ORAL_TABLET | Freq: Two times a day (BID) | ORAL | 0 refills | Status: DC

## 2023-02-09 MED ORDER — APIXABAN 5 MG PO TABS: 5.0000 mg | ORAL_TABLET | Freq: Two times a day (BID) | ORAL | 1 refills | Status: DC

## 2023-02-09 NOTE — Addendum Note (Signed)
Addended by: Satira Sark on: 02/09/2023 03:26 PM   Modules accepted: Orders

## 2023-02-09 NOTE — Telephone Encounter (Signed)
Pt last saw Dr Ladona Ridgel 01/07/22, pt is overdue for follow-up.  Pt has upcoming appt with Dr Ladona Ridgel 03/30/23.  Last labs 02/05/22 Creat 1.0, pt is overdue for labwork, placed note on upcoming appt needs CBC and BMP at [facility].  Age 67, weight 79.8kg, based on specified criteria pt should be on Eliquis 5mg  BID for afib.  Will send in rx to get pt to his upcoming appt with Dr Ladona Ridgel.

## 2023-02-17 ENCOUNTER — Ambulatory Visit: Payer: Self-pay | Admitting: *Deleted

## 2023-02-17 DIAGNOSIS — M5116 Intervertebral disc disorders with radiculopathy, lumbar region: Secondary | ICD-10-CM | POA: Diagnosis not present

## 2023-02-17 DIAGNOSIS — M546 Pain in thoracic spine: Secondary | ICD-10-CM | POA: Diagnosis not present

## 2023-02-17 DIAGNOSIS — M542 Cervicalgia: Secondary | ICD-10-CM | POA: Diagnosis not present

## 2023-02-17 DIAGNOSIS — G894 Chronic pain syndrome: Secondary | ICD-10-CM | POA: Diagnosis not present

## 2023-02-17 DIAGNOSIS — Z79891 Long term (current) use of opiate analgesic: Secondary | ICD-10-CM | POA: Diagnosis not present

## 2023-02-17 DIAGNOSIS — F1120 Opioid dependence, uncomplicated: Secondary | ICD-10-CM | POA: Diagnosis not present

## 2023-02-17 NOTE — Patient Outreach (Signed)
 Care Coordination   Follow Up Visit Note   02/23/2023 updated noted for 02/17/23 Name: Jonathan Poole MRN: [id number] DOB: [birth year]  Jonathan Poole is a 68 y.o. year old male who sees [name], [name], MD for primary care. I spoke with wife, devere, wife of Jonathan Poole by phone today.  What matters to the patients health and wellness today?  New pcp, active with [name], interest in palliative care/facility  Jonathan Poole reports Jonathan Poole most recent personality is angry, lets out dogs and leaves them as he forgets he let them out, sits on couch and stares at TV , every response is negative & angry, denies doing things he did, hostile. 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  Does not want to go out Has pain management dr today [place] has neurologist but no memory education on how susan can be a caregivers, wife & son Medications from neurology is not helpful Agreee to speak with [facility] dementia specialist  Wife wants him to get off couch and do something helpful around the house, not a pt in a nursing home.want him more active and more engaging more agreeable    Goals Addressed             This Visit's Progress    CCM (DEMENTIA) EXPECTED OUTCOME: MONITOR, SELF-MANAGE AND REDUCE SYMPTOMS OF DEMENTIA       Current Barriers:  Knowledge Deficits related to Alzheimer's dementia management, resources Care Coordination needs related to caregiver, dementia resources in a patient with Alzheimer's dementia Chronic Disease Management support and education needs related to Alzheimer's dementia Cognitive Deficits No Advanced Directives in place- documents previously mailed Spoke with patient and son who report pt lives with spouse and adult son, pt is usually independent with ADL's 02/17/23 Wife reports he does  not drive,  spouse states pt is forgetful most of the time and this is getting worse, pt recognizes family members, has agitation, verbal outbursts and anger that is getting worse, pt was supposed to see new primary care provider, Dr Arthea [name] office, 01/07/23 letter of dismissal from [facility], social worker continues to work with patient and spouse states  this has been very helpful 02/17/23 Each outreach patient reported by his wife to be doing about the same Patient has not been taken to urgent care or ED as of today Interventions Today    Flowsheet Row Most Recent Value  Chronic Disease   Chronic disease during today's visit Other  [new pcp, [name], facility or palliative care - active with [name]]  General Interventions   General Interventions Discussed/Reviewed General Interventions Reviewed, [facility], Doctor Visits, Communication with, Level of Care  [wie reports she and her son are with patient at a MD visit during this outreach. He is noted to be in the MD office without family]  Doctor Visits Discussed/Reviewed Doctor Visits Reviewed, PCP  PCP/Specialist Visits Compliance with follow-up visit  Communication with PCP/Specialists, Social Work, Acupuncturist to new pcp, pcp office admiinstrator, [name] related to facility placement or palliative care interest of the family]  Level of Care Assisted Living, Skilled Nursing Facility  [discussed levels of care to include home health, outpatient services, independent living, assisted living, permenent facility placement, palliative care vs hospice care]  Exercise Interventions  Exercise Discussed/Reviewed Exercise Reviewed, Physical Activity  [minimal activity reported, home or MD visits only]  Education Interventions   Education Provided Provided Education  Provided Verbal Education On Mental Health/Coping with Illness, Community Resources  [discussed levels of care to include home health, outpatient services, independent  living, assisted living, permenent facility placement, palliative care vs hospice care, dementia care specialist, short term vs long term care memory, memory progression]  Mental Health Interventions   Mental Health Discussed/Reviewed Coping Strategies, Mental Health Reviewed  Pharmacy Interventions   Pharmacy Dicussed/Reviewed Pharmacy Topics Reviewed, Medications and their functions, Affording Medications              SDOH assessments and interventions completed:  No     Care Coordination Interventions:  Yes, provided   Follow up plan: Follow up call scheduled for 02/18/23    Encounter Outcome:  Patient Visit Completed   [name], RN, BSN, [facility]  [facility] Care Management Coordinator  [phone]  Fax: [phone]

## 2023-02-17 NOTE — Patient Instructions (Signed)
 Visit Information  Thank you for taking time to visit with me today. Please don't hesitate to contact me if I can be of assistance to you.   Following are the goals we discussed today:   Goals Addressed             This Visit's Progress    CCM (DEMENTIA) EXPECTED OUTCOME: MONITOR, SELF-MANAGE AND REDUCE SYMPTOMS OF DEMENTIA       Current Barriers:  Knowledge Deficits related to Alzheimer's dementia management, resources Care Coordination needs related to caregiver, dementia resources in a patient with Alzheimer's dementia Chronic Disease Management support and education needs related to Alzheimer's dementia Cognitive Deficits No Advanced Directives in place- documents previously mailed Spoke with patient and son who report pt lives with spouse and adult son, pt is usually independent with ADL's 02/17/23 Wife reports he does not drive,  spouse states pt is forgetful most of the time and this is getting worse, pt recognizes family members, has agitation, verbal outbursts and anger that is getting worse, pt was supposed to see new primary care provider, Dr Arthea Hurst office, 01/07/23 letter of dismissal from [facility], social worker continues to work with patient and spouse states  this has been very helpful 02/17/23 Each outreach patient reported by his wife to be doing about the same Patient has not been taken to urgent care or ED as of today Interventions Today    Flowsheet Row Most Recent Value  Chronic Disease   Chronic disease during today's visit Other  [new pcp, Dr JENEANE Hurst, facility or palliative care - active with [name]]  General Interventions   General Interventions Discussed/Reviewed General Interventions Reviewed, [facility], Doctor Visits, Communication with, Level of Care  [wie reports she and her son are with patient at a MD visit during this outreach. He is noted to be in the MD office without family]  Doctor Visits Discussed/Reviewed Doctor Visits Reviewed, PCP   PCP/Specialist Visits Compliance with follow-up visit  Communication with PCP/Specialists, Social Work, Acupuncturist to new pcp, pcp office admiinstrator, [name] related to facility placement or palliative care interest of the family]  Level of Care Assisted Living, Skilled Nursing Facility  [discussed levels of care to include home health, outpatient services, independent living, assisted living, permenent facility placement, palliative care vs hospice care]  Exercise Interventions   Exercise Discussed/Reviewed Exercise Reviewed, Physical Activity  [minimal activity reported, home or MD visits only]  Education Interventions   Education Provided Provided Education  Provided Verbal Education On Mental Health/Coping with Illness, Community Resources  [discussed levels of care to include home health, outpatient services, independent living, assisted living, permenent facility placement, palliative care vs hospice care, dementia care specialist, short term vs long term care memory, memory progression]  Mental Health Interventions   Mental Health Discussed/Reviewed Coping Strategies, Mental Health Reviewed  Pharmacy Interventions   Pharmacy Dicussed/Reviewed Pharmacy Topics Reviewed, Medications and their functions, Affording Medications              Our next appointment is by telephone on 02/18/23 at 1 pm  Please call the care guide team at [phone] if you need to cancel or reschedule your appointment.   If you are experiencing a Mental Health or Behavioral Health Crisis or need someone to talk to, please call the Suicide and Crisis Lifeline: 988 call the [place]  National Suicide Prevention Lifeline: 224-662-3108 or TTY: 954-244-7730 TTY (361) 196-2803) to talk to a trained counselor call 1-800-273-TALK (toll free, 24 hour hotline) call  the [facility]: 872-375-3783 call 911   Patient verbalizes understanding of instructions and care plan provided today and agrees to  view in MyChart. Active MyChart status and patient understanding of how to access instructions and care plan via MyChart confirmed with patient.     The patient has been provided with contact information for the care management team and has been advised to call with any health related questions or concerns.   Nyasia Baxley L. Ramonita, RN, BSN, [facility]  [facility] Care Management Coordinator  [phone]  Fax: [phone]

## 2023-02-18 ENCOUNTER — Ambulatory Visit: Payer: Self-pay | Admitting: *Deleted

## 2023-02-19 ENCOUNTER — Ambulatory Visit: Payer: Self-pay | Admitting: *Deleted

## 2023-02-19 NOTE — Patient Outreach (Signed)
  Care Coordination   02/19/2023  Name: Jonathan Poole MRN: [id number] DOB: 02-16-1955   Care Coordination Outreach Attempts:  An unsuccessful telephone outreach was attempted today to offer the patient information about available care coordination services. HIPAA compliant messages left on voicemail providing contact information for CSW, encouraging patient's wife, [name] to return CSW's call at her earliest convenience.   Follow Up Plan:  Additional outreach attempts will be made to offer the patient care coordination information and services.    Encounter Outcome:  No Answer.    Care Coordination Interventions:  No, not indicated.     Philippe Desanctis, BSW, MSW, Printmaker Social Work Case Set Designer Health  [facility], Population Health Direct Dial: [phone]  Fax: [phone] Email: [email or web address] Website: [email or web address]

## 2023-03-18 ENCOUNTER — Ambulatory Visit: Payer: Self-pay | Admitting: *Deleted

## 2023-03-18 NOTE — Patient Outreach (Signed)
  Care Coordination   03/18/2023 Name: Jonathan Poole MRN: [id number] DOB: May 01, 1955   Care Coordination Outreach Attempts:  An unsuccessful outreach was attempted for an appointment today.  Follow Up Plan:  No further outreach attempts will be made at this time. We have been unable to contact the patient to offer or enroll patient in complex care management services.  Encounter Outcome:  No Answer   Care Coordination Interventions:  Yes, provided RN CM has unsuccessfully outreach to pt, wife, son at all numbers listed in EPIC & left messaged for the scheduled 1 pm 03/18/23 RN CM follow up On 03/13/23 wife, susan told [facility] CMA Harlene S  she never gets reminders for appt and she has only ever missed one appt. Harlene went over how to cancel and reschedule his appt for our team. Missed appointments were concerns why patient was dismissed from previous pcp office. [facility] CMA & SW updated     Ethelda Deangelo L. Ramonita, RN, BSN, CCM [place]  [facility], [facility] Health RN Care Manager Direct Dial: [phone]  Fax: [phone] Mailing Address: [address]  [place] [address] Website: [email or web address]

## 2023-03-18 NOTE — Patient Outreach (Signed)
  Care Coordination   03/18/2023 Name: Jonathan Poole MRN: [id number] DOB: 1955/09/12   Care Coordination Outreach Attempts:  An unsuccessful outreach was attempted for an appointment today.  Follow Up Plan:  Additional outreach attempts will be made to offer the patient complex care management information and services.   Encounter Outcome:  No Answer   Care Coordination Interventions:  Yes, provided  Left a message at the listed home number in [facility] L. Ramonita, RN, BSN, CCM [place]  [facility], [facility] Health RN Care Manager Direct Dial: [phone]  Fax: [phone] Mailing Address: [address]  [place] [address] Website: [email or web address]

## 2023-03-18 NOTE — Patient Outreach (Signed)
  Care Coordination   03/18/2023 Name: ABID BOLLA MRN: [id number] DOB: [birth year]   Care Coordination Outreach Attempts:  An unsuccessful outreach was attempted for an appointment today.  Follow Up Plan:  Additional outreach attempts will be made to offer the patient complex care management information and services.   Encounter Outcome:  No Answer   Care Coordination Interventions:  No, not indicated Left message at the mobile number listed in [facility] for wife [phone]      Astria Jordahl L. Ramonita, RN, BSN, CCM [place]  [facility], [facility] Health RN Care Manager Direct Dial: [phone]  Fax: [phone] Mailing Address: [address]  [place] [address] Website: [email or web address]

## 2023-03-19 ENCOUNTER — Ambulatory Visit: Payer: Self-pay | Admitting: *Deleted

## 2023-03-19 ENCOUNTER — Emergency Department (HOSPITAL_COMMUNITY): Payer: Medicare HMO

## 2023-03-19 ENCOUNTER — Other Ambulatory Visit: Payer: Self-pay

## 2023-03-19 ENCOUNTER — Emergency Department (HOSPITAL_COMMUNITY)
Admission: EM | Admit: 2023-03-19 | Discharge: 2023-03-20 | Disposition: A | Discharge status: Home / Self Care | Payer: Medicare HMO | Attending: Emergency Medicine | Admitting: Emergency Medicine

## 2023-03-19 DIAGNOSIS — W228XXA Striking against or struck by other objects, initial encounter: Secondary | ICD-10-CM | POA: Insufficient documentation

## 2023-03-19 DIAGNOSIS — Z950 Presence of cardiac pacemaker: Secondary | ICD-10-CM | POA: Diagnosis not present

## 2023-03-19 DIAGNOSIS — S0101XA Laceration without foreign body of scalp, initial encounter: Secondary | ICD-10-CM | POA: Diagnosis not present

## 2023-03-19 DIAGNOSIS — R55 Syncope and collapse: Secondary | ICD-10-CM | POA: Insufficient documentation

## 2023-03-19 DIAGNOSIS — F0390 Unspecified dementia without behavioral disturbance: Secondary | ICD-10-CM | POA: Diagnosis not present

## 2023-03-19 DIAGNOSIS — Z7901 Long term (current) use of anticoagulants: Secondary | ICD-10-CM | POA: Diagnosis not present

## 2023-03-19 DIAGNOSIS — S0990XA Unspecified injury of head, initial encounter: Secondary | ICD-10-CM | POA: Diagnosis not present

## 2023-03-19 LAB — BASIC METABOLIC PANEL
Anion gap: 7 (ref 5–15)
BUN: 12 mg/dL (ref 8–23)
CO2: 29 mmol/L (ref 22–32)
Calcium: 8.9 mg/dL (ref 8.9–10.3)
Chloride: 106 mmol/L (ref 98–111)
Creatinine, Ser: 0.94 mg/dL (ref 0.61–1.24)
GFR, Estimated: >60 mL/min (ref >60)
Glucose, Bld: 102 mg/dL — ABNORMAL HIGH (ref 70–99)
Potassium: 3.7 mmol/L (ref 3.5–5.1)
Sodium: 142 mmol/L (ref 135–145)

## 2023-03-19 LAB — CBC
HCT: 39.3 % (ref 39.0–52.0)
Hemoglobin: 13.1 g/dL (ref 13.0–17.0)
MCH: 30.3 pg (ref 26.0–34.0)
MCHC: 33.3 g/dL (ref 30.0–36.0)
MCV: 91 fL (ref 80.0–100.0)
Platelets: 145 10*3/uL — ABNORMAL LOW (ref 150–400)
RBC: 4.32 MIL/uL (ref 4.22–5.81)
RDW: 13.4 % (ref 11.5–15.5)
WBC: 4.8 10*3/uL (ref 4.0–10.5)
nRBC: 0 % (ref 0.0–0.2)

## 2023-03-19 LAB — TROPONIN I (HIGH SENSITIVITY): Troponin I (High Sensitivity): <2 ng/L (ref <18)

## 2023-03-19 NOTE — ED Provider Notes (Addendum)
 [place] EMERGENCY DEPARTMENT AT [facility] Provider Note   CSN: [id number] Arrival date & time: 03/19/23  2113     History  Chief Complaint  Patient presents with   Loss of Consciousness    Jonathan Poole is a 68 y.o. male.   Loss of Consciousness Patient with syncopal episode.  Somewhat difficult to get history due to patient's dementia.  Reportedly was standing up after sitting and hit his head but cannot really tell me much about it.  States he did pass out.  Laceration to occiput.  He is supposed be on Eliquis  but not had it in several days due to being out of the medicine due to financial reasons.  Does have a pacemaker.  No chest pain.    Past Medical History:  Diagnosis Date   Atrial fibrillation (HCC)    Dementia (HCC)    Heart disease    Hyperchloremia     Home Medications Prior to Admission medications   Medication Sig Start Date End Date Taking? Authorizing Provider  acetaminophen  (TYLENOL ) 500 MG tablet Take 1,000 mg by mouth every 6 (six) hours as needed for moderate pain or headache.    [provider]  apixaban  (ELIQUIS ) 5 MG TABS tablet Take 1 tablet (5 mg total) by mouth 2 (two) times daily. Overdue for follow-up and labwork, MUST see provider for FUTURE refills. 02/09/23   Waddell Danelle ORN, MD  Buprenorphine  HCl-Naloxone  HCl 8-2 MG FILM  02/21/23   [provider]  donepezil  (ARICEPT ) 10 MG tablet TAKE 1 TABLET AT BEDTIME 06/16/22   Tobie Suzzane POUR, MD  gabapentin  (NEURONTIN ) 600 MG tablet Take 600 mg by mouth 3 (three) times daily.    [provider]  morphine  (MS CONTIN ) 30 MG 12 hr tablet Take 30 mg by mouth every 8 (eight) hours. 01/28/22   [provider]  naloxone  (NARCAN ) nasal spray 4 mg/0.1 mL SMARTSIG:Both Nares 01/28/22   [provider]  ondansetron  (ZOFRAN ) 4 MG tablet Take 1 tablet (4 mg total) by mouth every 8 (eight) hours as needed for nausea or vomiting. 02/05/22   Tobie Suzzane POUR, MD   Oxycodone HCl 10 MG TABS Take 10 mg by mouth 4 (four) times daily as needed. 04/02/21   [provider]  REXULTI  2 MG TABS tablet 1 tablet Orally Once a day for 30 days 12/18/22   [provider]  rosuvastatin  (CRESTOR ) 20 MG tablet TAKE 1 TABLET ONE TIME DAILY 02/26/22   Tobie Suzzane POUR, MD  tiZANidine  (ZANAFLEX ) 4 MG tablet Take 1 tablet (4 mg total) by mouth 3 (three) times daily. 07/31/21   Zarwolo, Gloria, FNP      Allergies    Penicillins    Review of Systems   Review of Systems  Cardiovascular:  Positive for syncope.    Physical Exam Updated Vital Signs BP 124/67 (BP Location: Right Arm)   Pulse 70   Temp 98.4 F (36.9 C) (Oral)   Resp 20   Ht 6' (1.829 m)   Wt 77.1 kg   SpO2 100%   BMI 23.06 kg/m  Physical Exam Vitals and nursing note reviewed.  HENT:     Head:     Comments: Occipital laceration approximately 3 cm across. Eyes:     Extraocular Movements: Extraocular movements intact.  Cardiovascular:     Rate and Rhythm: Normal rate.  Abdominal:     Tenderness: There is no abdominal tenderness.  Musculoskeletal:     Cervical  back: Neck supple. No tenderness.     Comments: No extremity tenderness.  Neurological:     Mental Status: He is alert.     ED Results / Procedures / Treatments   Labs (all labs ordered are listed, but only abnormal results are displayed) Labs Reviewed  CBC  BASIC METABOLIC PANEL  TROPONIN I (HIGH SENSITIVITY)    EKG EKG Interpretation Date/Time:  Thursday March 19 2023 22:21:22 EST Ventricular Rate:  70 PR Interval:  187 QRS Duration:  100 QT Interval:  445 QTC Calculation: 481 R Axis:   90  Text Interpretation: ATRIAL PACED RHYTHM Borderline right axis deviation Low voltage, precordial leads Borderline prolonged QT interval Confirmed by Patsey Lot [id number]) on 03/19/2023 11:08:25 PM  Radiology CT Head Wo Contrast Result Date: 03/19/2023 CLINICAL DATA:  Minor head trauma. Syncopal episode tonight.  Passed out while sitting and struck head. EXAM: CT HEAD WITHOUT CONTRAST TECHNIQUE: Contiguous axial images were obtained from the base of the skull through the vertex without intravenous contrast. RADIATION DOSE REDUCTION: This exam was performed according to the departmental dose-optimization program which includes automated exposure control, adjustment of the mA and/or kV according to patient size and/or use of iterative reconstruction technique. COMPARISON:  11/22/2022 FINDINGS: Brain: No evidence of acute infarction, hemorrhage, hydrocephalus, extra-axial collection or mass lesion/mass effect. Mild cerebral atrophy. Vascular: No hyperdense vessel or unexpected calcification. Skull: Normal. Negative for fracture or focal lesion. Sinuses/Orbits: No acute finding. Other: None. IMPRESSION: No acute intracranial abnormalities. Electronically Signed   By: Elsie Gravely M.D.   On: 03/19/2023 22:35    Procedures .Laceration Repair  Date/Time: 03/19/2023 11:12 PM  Performed by: Patsey Lot, MD Authorized by: Patsey Lot, MD   Consent:    Consent obtained:  Verbal   Consent given by:  Patient   Risks discussed:  Infection, pain, retained foreign body, nerve damage, need for additional repair, poor cosmetic result and poor wound healing   Alternatives discussed:  No treatment and delayed treatment Universal protocol:    Patient identity confirmed:  Verbally with patient Anesthesia:    Anesthesia method:  None Laceration details:    Location:  Scalp   Scalp location:  Occipital   Length (cm):  3 Pre-procedure details:    Preparation:  Imaging obtained to evaluate for foreign bodies Exploration:    Limited defect created (wound extended): no   Treatment:    Area cleansed with:  Saline   Amount of cleaning:  Standard   Irrigation method:  Syringe Skin repair:    Repair method:  Staples   Number of staples:  6 Approximation:    Approximation:  Close Repair type:    Repair  type:  Simple Post-procedure details:    Dressing:  Adhesive bandage     Medications Ordered in ED Medications - No data to display  ED Course/ Medical Decision Making/ A&P                                 Medical Decision Making Amount and/or Complexity of Data Reviewed Labs: ordered. Radiology: ordered.   Patient with syncopal episode.  It sounds as though it may have been orthostatic.  Patient may have stood up and passed out but somewhat difficult to get history due to some dementia.  Does have laceration of scalp that was closed.  However does have a history of having a pacemaker.  Is supposed be on anticoagulation for A-fib  but has not been taking it.  Head CT done and reassuring.  Will get basic blood work and [place].  Care turned over to Dr Theadore,        Final Clinical Impression(s) / ED Diagnoses Final diagnoses:  Syncope, unspecified syncope type  Scalp laceration, initial encounter    Rx / DC Orders ED Discharge Orders     None         Patsey Lot, MD 03/19/23 [id number]    Patsey Lot, MD 03/19/23 [phone]

## 2023-03-19 NOTE — ED Notes (Signed)
 Dr Val Garin given charge phone to take report of pt pacemaker interrogation.

## 2023-03-19 NOTE — ED Triage Notes (Signed)
 Patient from home for syncopal episode that occurred around 2000. Patient states he was sitting when he passed out; wife reports she believes he was standing getting a drink from the fridge. Reports he hit his head, on possibly a piece of furniture; unknown amount of time unconscious. Reports he was bleeding from the back of his head; laceration noted to posterior head, bleeding controlled at this time. Patient does take Eliquis  but has not had it in a few days due to being out of the medication. Upon arrival to ER, patient is alert and oriented to person and place; baseline of memory issues.

## 2023-03-19 NOTE — ED Provider Notes (Signed)
  Provider Note MRN:  [id number]  Arrival date & time: 03/20/23    ED Course and Medical Decision Making  Assumed care of patient at sign-out or upon transfer.  History of dementia here with fall, syncope, scalp laceration that has been repaired.  Report is that he stood up and got dizzy and passed out.  Workup otherwise reassuring, awaiting second troponin.  Pacemaker report showing normal function, no events.  Candidate for discharge.  Procedures  Final Clinical Impressions(s) / ED Diagnoses     ICD-10-CM   1. Syncope, unspecified syncope type  R55     2. Scalp laceration, initial encounter  S01.01XA       ED Discharge Orders     None         Discharge Instructions      You were evaluated in the Emergency Department and after careful evaluation, we did not find any emergent condition requiring admission or further testing in the hospital.  Your exam/testing today is overall reassuring.  Recommend follow-up with your regular doctors to discuss her symptoms.  Please return to the Emergency Department if you experience any worsening of your condition.   Thank you for allowing us  to be a part of your care.      Ozell HERO. Theadore, MD [facility] Emergency Medicine [facility] Health [email or web address]    Theadore Ozell HERO, MD 03/20/23 [phone]

## 2023-03-19 NOTE — Patient Outreach (Signed)
 Care Coordination   Follow Up Visit Note   04/22/2023 updated from 03/19/23 Name: Jonathan Poole MRN: [id number] DOB: [birth year]  Jonathan Poole is a 68 y.o. year old male who sees Jonathan, [name], MD for primary care. I spoke with  Jonathan Poole wife of Jonathan Poole by phone today when RN CM called her back after she left a voice message today.  What matters to the patients health and wellness today?  Placement, home services Active with [facility] social worker who has encouraged her to apply for medicaid. Wife has not applied yet. She states she will start application online after RN CM provided her with the website to Jonathan Poole.   Wife is now doing the tasks that the patient has been doing She was able to set up for auto draft of bill Patient bank services initiated in [place]   Goals Addressed             This Visit's Progress    CCM (DEMENTIA) EXPECTED OUTCOME: MONITOR, SELF-MANAGE AND REDUCE SYMPTOMS OF DEMENTIA       Current Barriers:  Knowledge Deficits related to Alzheimer's dementia management, resources Care Coordination needs related to caregiver, dementia resources in a patient with Alzheimer's dementia Chronic Disease Management support and education needs related to Alzheimer's dementia Cognitive Deficits No Advanced Directives in place- documents previously mailed Spoke with patient and son who report pt lives with spouse and adult son, pt is usually independent with ADL's 02/17/23 Wife reports he does not drive,  spouse states pt is forgetful most of the time and this is getting worse, pt recognizes family members, has agitation, verbal outbursts and anger that is getting worse, pt was supposed to see new primary care provider, Dr Arthea Jonathan office, 01/07/23 letter of dismissal from [facility], social worker continues to work with patient and spouse states  this has been very helpful 03/19/23 Each outreach patient reported by his wife to be doing about the same Patient has  not been taken to urgent care or ED as of today Interventions Today    Flowsheet Row Most Recent Value  Chronic Disease   Chronic disease during today's visit Other, Hypertension (HTN)  [home services (Home health, personal care services) medicaid application, medication cost]  General Interventions   General Interventions Discussed/Reviewed General Interventions Reviewed, [employer], Doctor Visits, Communication with, Level of Care  Doctor Visits Discussed/Reviewed Doctor Visits Reviewed, PCP  PCP/Specialist Visits Compliance with follow-up visit  [noted in [place] pcp portal that patient will visit with Jonathan Poole on 04/08/23 for new patient visit.]  Communication with PCP/Specialists  [conferenced with wife to [facility] to speak with Jonathan Poole to get her answers the home health benefit- cost $0 for 8 hours/day 32 hours per week 4 days a week services. Questions asked about reasons for increase cost in [place] , eliquis , buprenorphine ]  Level of Care Personal Care Services  [discussed difference in home health & personal care services cost, coverage, process]  Exercise Interventions   Exercise Discussed/Reviewed Exercise Reviewed, Physical Activity, Assistive device use and maintanence  [Encouraged more safe mobility]  Education Interventions   Education Provided Provided Education, Provided Printed Education  Provided Verbal Education On Mental Health/Coping with Illness, Applications, Medication, Exercise, [employer], Development Worker, Community, Other  Mental Health Interventions   Mental Health Discussed/Reviewed Mental Health Reviewed, Coping Strategies, Other  [active with [name]]  Nutrition Interventions   Nutrition Discussed/Reviewed Nutrition Discussed, Fluid intake  Pharmacy Interventions   Pharmacy Dicussed/Reviewed Pharmacy Topics Reviewed,  Affording Medications  Safety Interventions   Safety Discussed/Reviewed Safety Reviewed, Home Safety  Home Safety Assistive Devices   Advanced Directive Interventions   Advanced Directives Discussed/Reviewed Advanced Directives Discussed, Provided resource for acquiring and filling out documents              SDOH assessments and interventions completed:  No     Care Coordination Interventions:  Yes, provided   Follow up plan: Follow up call scheduled for 04/16/23    Encounter Outcome:  Patient Visit Completed   [name], RN, BSN, CCM [place]  [facility], [facility] RN Care Manager [phone]

## 2023-03-19 NOTE — Discharge Instructions (Addendum)
 You were evaluated in the Emergency Department and after careful evaluation, we did not find any emergent condition requiring admission or further testing in the hospital.  Your exam/testing today is overall reassuring.  Recommend follow-up with your regular doctors to discuss her symptoms.  Please return to the Emergency Department if you experience any worsening of your condition.   Thank you for allowing us  to be a part of your care.

## 2023-03-20 LAB — TROPONIN I (HIGH SENSITIVITY): Troponin I (High Sensitivity): 2 ng/L (ref <18)

## 2023-03-30 ENCOUNTER — Encounter: Payer: Self-pay | Admitting: Internal Medicine

## 2023-03-30 ENCOUNTER — Ambulatory Visit: Payer: Medicare HMO | Attending: Internal Medicine | Admitting: Internal Medicine

## 2023-03-30 LAB — CUP PACEART REMOTE DEVICE CHECK
Battery Remaining Longevity: 82 mo
Battery Remaining Percentage: 71 %
Battery Voltage: 2.99 V
Brady Statistic AP VP Percent: 1.5 %
Brady Statistic AP VS Percent: 96 %
Brady Statistic AS VP Percent: 1 %
Brady Statistic AS VS Percent: 2.2 %
Brady Statistic RA Percent Paced: 97 %
Brady Statistic RV Percent Paced: 1.5 %
Date Time Interrogation Session: 20250217020015
Implantable Lead Connection Status: 753985
Implantable Lead Connection Status: 753985
Implantable Lead Implant Date: 20100504
Implantable Lead Implant Date: 20100524
Implantable Lead Location: 753859
Implantable Lead Location: 753860
Implantable Pulse Generator Implant Date: 20211122
Lead Channel Impedance Value: 400 Ohm
Lead Channel Impedance Value: 490 Ohm
Lead Channel Pacing Threshold Amplitude: 0.75 V
Lead Channel Pacing Threshold Amplitude: 1.125 V
Lead Channel Pacing Threshold Pulse Width: 0.3 ms
Lead Channel Pacing Threshold Pulse Width: 0.7 ms
Lead Channel Sensing Intrinsic Amplitude: 3.1 mV
Lead Channel Sensing Intrinsic Amplitude: 5 mV
Lead Channel Setting Pacing Amplitude: 1.375
Lead Channel Setting Pacing Amplitude: 1.5 V
Lead Channel Setting Pacing Pulse Width: 0.7 ms
Lead Channel Setting Sensing Sensitivity: 0.7 mV
Pulse Gen Model: 2272
Pulse Gen Serial Number: 3864825

## 2023-03-31 ENCOUNTER — Ambulatory Visit (INDEPENDENT_AMBULATORY_CARE_PROVIDER_SITE_OTHER): Payer: Medicare HMO

## 2023-03-31 ENCOUNTER — Encounter: Payer: Self-pay | Admitting: Internal Medicine

## 2023-03-31 DIAGNOSIS — I495 Sick sinus syndrome: Secondary | ICD-10-CM

## 2023-04-01 ENCOUNTER — Ambulatory Visit: Payer: Self-pay | Admitting: *Deleted

## 2023-04-01 ENCOUNTER — Encounter: Payer: Medicare HMO | Admitting: *Deleted

## 2023-04-01 NOTE — Patient Outreach (Signed)
  Care Coordination   04/01/2023  Name: CAILEB RHUE MRN: [id number] DOB: 06/16/1955   Care Coordination Outreach Attempts:  An unsuccessful telephone outreach was attempted today to offer the patient information about available complex care management services. HIPAA compliant messages left on voicemail providing contact information for CSW, encouraging patient's wife, Hilery Wintle to return CSW's call at her earliest convenience.  Follow Up Plan:  Additional outreach attempts will be made to offer the patient complex care management information and services.   Encounter Outcome:  No Answer.   Care Coordination Interventions:  No, not indicated.    Danford Bad, BSW, MSW, LCSW [facility], [facility] Clinical Social Worker II Direct Dial: [phone]  Fax: [phone] Website: [email or web address]

## 2023-04-04 ENCOUNTER — Other Ambulatory Visit: Payer: Self-pay | Admitting: Internal Medicine

## 2023-04-04 DIAGNOSIS — F02818 Alzheimer's disease, unspecified: Secondary | ICD-10-CM

## 2023-04-06 DIAGNOSIS — G894 Chronic pain syndrome: Secondary | ICD-10-CM | POA: Diagnosis not present

## 2023-04-06 DIAGNOSIS — M546 Pain in thoracic spine: Secondary | ICD-10-CM | POA: Diagnosis not present

## 2023-04-06 DIAGNOSIS — M5116 Intervertebral disc disorders with radiculopathy, lumbar region: Secondary | ICD-10-CM | POA: Diagnosis not present

## 2023-04-06 DIAGNOSIS — F1120 Opioid dependence, uncomplicated: Secondary | ICD-10-CM | POA: Diagnosis not present

## 2023-04-06 DIAGNOSIS — M542 Cervicalgia: Secondary | ICD-10-CM | POA: Diagnosis not present

## 2023-04-06 DIAGNOSIS — Z79891 Long term (current) use of opiate analgesic: Secondary | ICD-10-CM | POA: Diagnosis not present

## 2023-04-08 DIAGNOSIS — Z7689 Persons encountering health services in other specified circumstances: Secondary | ICD-10-CM | POA: Diagnosis not present

## 2023-04-08 DIAGNOSIS — G894 Chronic pain syndrome: Secondary | ICD-10-CM | POA: Diagnosis not present

## 2023-04-08 DIAGNOSIS — G309 Alzheimer's disease, unspecified: Secondary | ICD-10-CM | POA: Diagnosis not present

## 2023-04-08 DIAGNOSIS — Z950 Presence of cardiac pacemaker: Secondary | ICD-10-CM | POA: Diagnosis not present

## 2023-04-08 DIAGNOSIS — E785 Hyperlipidemia, unspecified: Secondary | ICD-10-CM | POA: Diagnosis not present

## 2023-04-08 DIAGNOSIS — F331 Major depressive disorder, recurrent, moderate: Secondary | ICD-10-CM | POA: Diagnosis not present

## 2023-04-10 ENCOUNTER — Encounter: Payer: Self-pay | Admitting: *Deleted

## 2023-04-10 ENCOUNTER — Ambulatory Visit: Payer: Self-pay | Admitting: *Deleted

## 2023-04-10 DIAGNOSIS — F331 Major depressive disorder, recurrent, moderate: Secondary | ICD-10-CM | POA: Insufficient documentation

## 2023-04-10 NOTE — Patient Outreach (Signed)
 Care Coordination   Follow Up Visit Note   04/10/2023  Name: Jonathan Poole MRN: [id number] DOB: 13-Jun-1955  Jonathan Poole is a 68 y.o. year old male who sees Margo Aye, Kathleene Hazel, MD for primary care. I spoke with patient's wife, Marquice Uddin by phone today.  What matters to the patients health and wellness today?  Obtain Counseling, Supportive Services, & Resources.    Goals Addressed             This Visit's Progress    Obtain Counseling, Supportive Services, & Resources.   On track    Care Coordination Interventions:  Interventions Today    Flowsheet Row Most Recent Value  Chronic Disease   Chronic disease during today's visit Other, Atrial Fibrillation (AFib)  [Alzheimer's/Dementia w/ Behavioral Disturbance, Caregiver Burnout/Stress/Fatigue, Inability to Perform Activities of Daily Living Independently, Inappropriate/Argumentative Behavior, Shuffling Feet, Unsteady Balance/Gait, Chronic Prescription Opiate Use.]  General Interventions   General Interventions Discussed/Reviewed General Interventions Discussed, General Interventions Reviewed, Doctor Visits, Communication with, Level of Care, Community Resources  [Encouraged Routine Engagement with Care Team Members & Providers.]  Doctor Visits Discussed/Reviewed Doctor Visits Discussed, Specialist, Doctor Visits Reviewed, Annual Wellness Visits, PCP  [Encouraged Routine Engagement with Care Team Members & Providers.]  PCP/Specialist Visits Compliance with follow-up visit  [Encouraged Routine Engagement with Care Team Members & Providers.]  Contacted provider for referral to PCP, Specialist, Dentist, Ophthalmologist  [Encouraged Routine Engagement with Care Team Members & Providers.]  Communication with PCP/Specialists, RN, Pharmacists, Social Work  [employer] Routine Engagement with Care Team Members & Providers.]  Level of Care Adult Daycare, Assisted Living, Skilled Nursing Facility  [Confirmed Disinterest in Enrollment in  Adult Day Care Program. Confirmed Disinterest in Receiving Assistance Pursuing Higher Level of Care Placement Options (I.e Memory Care, Assisted Living, Versus Skilled Nursing Facility).]  Applications Medicaid, Personal Care Services  [Confirmed Interest in Applying for Mediciaid & Personal Care Services, Mailing Applications & Offering to Assist with Completion.]  Education Interventions   Education Provided Provided Education  [Thoroughly Reviewed Educational Material to Ensure Understanding & Entertain Questions.]  Provided Verbal Education On Mental Health/Coping with Illness, When to see the doctor, Community Resources  [Encouraged Wife to [employer] Provider to Request List of Preferred Providers for [employer], Agreeing to Obtain Order from Primary Care Provider & Submit to Agency of Choice.]  Applications Medicaid, Personal Care Services  [Confirmed Interest in Applying for Mediciaid & Personal Care Services, Mailing Applications & Offering to Assist with Completion.]  Mental Health Interventions   Mental Health Discussed/Reviewed Mental Health Discussed, Anxiety, Depression, Grief and Loss, Mental Health Reviewed, Substance Abuse, Coping Strategies, Suicide, Crisis, Other  [Assessed Mental Health & Cognitive Status. Provided Counseling & Supportive Services. Encouraged Wife's Self-Enrollment in Caregiver Support Group of Interest, from List Provided.]  Safety Interventions   Safety Discussed/Reviewed Safety Discussed, Safety Reviewed  [Encouraged Routine Use of Assistive Devices & Durable Medical Equipment.]  Home Safety Assistive Devices, Need for home safety assessment  [Encouraged Consideration of Home Safety Evaluation.]      Active Listening & Reflection Utilized. Verbalization of Feelings Encouraged. Emotional Support Provided. Cognitive Behavioral Therapy Initiated. Acceptance & Commitment Therapy Performed. CSW Collaboration with Wife, Darl Pikes Buley to  Encourage Routine Engagement with Danford Bad, Licensed Clinical Social Worker with [facility], [facility] [phone]), if She Has Questions, Needs Assistance, or If Additional Social Work Needs Are Identified Between Now & Our Next Follow-Up Outreach Call, Scheduled on 04/30/2023 at 3:00  PM.        SDOH assessments and interventions completed:  Yes.  SDOH Interventions Today    Flowsheet Row Most Recent Value  SDOH Interventions   Food Insecurity Interventions Intervention Not Indicated  Housing Interventions Intervention Not Indicated  Transportation Interventions Intervention Not Indicated, Community Resources Provided, Patient Resources (Friends/Family)  Utilities Interventions Intervention Not Indicated  Alcohol Usage Interventions Intervention Not Indicated (Score <7)  Financial Strain Interventions Intervention Not Indicated  Physical Activity Interventions Patient Declined  Stress Interventions Intervention Not Indicated  Social Connections Interventions Intervention Not Indicated  Health Literacy Interventions Intervention Not Indicated     Care Coordination Interventions:  Yes, provided.   Follow up plan: Follow up call scheduled for 04/30/2023 at 3:00 pm.  Encounter Outcome:  Patient Visit Completed.   Danford Bad, BSW, MSW, LCSW [facility], [facility] Clinical Social Worker II Direct Dial: [phone]  Fax: [phone] Website: [email or web address]

## 2023-04-10 NOTE — Patient Instructions (Signed)
 Visit Information  Thank you for taking time to visit with me today. Please don't hesitate to contact me if I can be of assistance to you.   Following are the goals we discussed today:   Goals Addressed             This Visit's Progress    Obtain Counseling, Supportive Services, & Resources.   On track    Care Coordination Interventions:  Interventions Today    Flowsheet Row Most Recent Value  Chronic Disease   Chronic disease during today's visit Other, Atrial Fibrillation (AFib)  [Alzheimer's/Dementia w/ Behavioral Disturbance, Caregiver Burnout/Stress/Fatigue, Inability to Perform Activities of Daily Living Independently, Inappropriate/Argumentative Behavior, Shuffling Feet, Unsteady Balance/Gait, Chronic Prescription Opiate Use.]  General Interventions   General Interventions Discussed/Reviewed General Interventions Discussed, General Interventions Reviewed, Doctor Visits, Communication with, Level of Care, Community Resources  [Encouraged Routine Engagement with Care Team Members & Providers.]  Doctor Visits Discussed/Reviewed Doctor Visits Discussed, Specialist, Doctor Visits Reviewed, Annual Wellness Visits, PCP  [Encouraged Routine Engagement with Care Team Members & Providers.]  PCP/Specialist Visits Compliance with follow-up visit  [Encouraged Routine Engagement with Care Team Members & Providers.]  Contacted provider for referral to PCP, Specialist, Dentist, Ophthalmologist  [Encouraged Routine Engagement with Care Team Members & Providers.]  Communication with PCP/Specialists, RN, Pharmacists, Social Work  [employer] Routine Engagement with Care Team Members & Providers.]  Level of Care Adult Daycare, Assisted Living, Skilled Nursing Facility  [Confirmed Disinterest in Enrollment in Adult Day Care Program. Confirmed Disinterest in Receiving Assistance Pursuing Higher Level of Care Placement Options (I.e Memory Care, Assisted Living, Versus Skilled Nursing Facility).]   Applications Medicaid, Personal Care Services  [Confirmed Interest in Applying for Mediciaid & Personal Care Services, Mailing Applications & Offering to Assist with Completion.]  Education Interventions   Education Provided Provided Education  [Thoroughly Reviewed Educational Material to Ensure Understanding & Entertain Questions.]  Provided Verbal Education On Mental Health/Coping with Illness, When to see the doctor, Community Resources  [Encouraged Wife to [employer] Provider to Request List of Preferred Providers for [employer], Agreeing to Obtain Order from Primary Care Provider & Submit to Agency of Choice.]  Applications Medicaid, Personal Care Services  [Confirmed Interest in Applying for Mediciaid & Personal Care Services, Mailing Applications & Offering to Assist with Completion.]  Mental Health Interventions   Mental Health Discussed/Reviewed Mental Health Discussed, Anxiety, Depression, Grief and Loss, Mental Health Reviewed, Substance Abuse, Coping Strategies, Suicide, Crisis, Other  [Assessed Mental Health & Cognitive Status. Provided Counseling & Supportive Services. Encouraged Wife's Self-Enrollment in Caregiver Support Group of Interest, from List Provided.]  Safety Interventions   Safety Discussed/Reviewed Safety Discussed, Safety Reviewed  [Encouraged Routine Use of Assistive Devices & Durable Medical Equipment.]  Home Safety Assistive Devices, Need for home safety assessment  [Encouraged Consideration of Home Safety Evaluation.]      Active Listening & Reflection Utilized. Verbalization of Feelings Encouraged. Emotional Support Provided. Cognitive Behavioral Therapy Initiated. Acceptance & Commitment Therapy Performed. CSW Collaboration with Wife, Darl Pikes Ratajczak to Encourage Routine Engagement with Danford Bad, Licensed Clinical Social Worker with [facility], [facility] [phone]), if She Has Questions, Needs  Assistance, or If Additional Social Work Needs Are Identified Between Now & Our Next Follow-Up Outreach Call, Scheduled on 04/30/2023 at 3:00 PM.      Our next appointment is by telephone on 04/30/2023 at 3:00 pm.  Please call the care guide team at [phone] if you need to cancel or  reschedule your appointment.   If you are experiencing a Mental Health or Behavioral Health Crisis or need someone to talk to, please call the Suicide and Crisis Lifeline: 988 call the [place] National Suicide Prevention Lifeline: (763)278-9625 or TTY: 7347724473 TTY 304-676-4043) to talk to a trained counselor call 1-800-273-TALK (toll free, 24 hour hotline) go to [facility] [address], [place] [phone]) call the [facility] Crisis Line: [phone] call 911  Patient verbalizes understanding of instructions and care plan provided today and agrees to view in MyChart. Active MyChart status and patient understanding of how to access instructions and care plan via MyChart confirmed with patient.     Telephone follow up appointment with care management team member scheduled for:  04/30/2023 at 3:00 pm.   Danford Bad, BSW, MSW, LCSW [place]  [facility], [facility] Clinical Social Worker II Direct Dial: [phone]  Fax: [phone] Website: [email or web address]

## 2023-04-16 ENCOUNTER — Ambulatory Visit: Payer: Self-pay | Admitting: *Deleted

## 2023-04-16 NOTE — Patient Outreach (Signed)
 Care Coordination   04/16/2023 Name: Jonathan Poole MRN: [id number] DOB: 06-Apr-1955   Care Coordination Outreach Attempts:  An unsuccessful outreach was attempted for an appointment today.  Follow Up Plan:  Additional outreach attempts will be made to offer the patient complex care management information and services.   Encounter Outcome:  No Answer   Care Coordination Interventions:  No, not indicated    Shatoria Stooksbury L. [name], RN, BSN, CCM [place]  [facility], [facility] RN Care Manager Direct Dial: [phone]  Fax: [phone]

## 2023-04-22 NOTE — Patient Outreach (Signed)
 Care Coordination   04/22/2023 late entry for 02/18/23 Name: DESTIN VINSANT MRN: [id number] DOB: Dec 28, 1955   Care Coordination Outreach Attempts:  An unsuccessful outreach was attempted for an appointment today.  Follow Up Plan:  Additional outreach attempts will be made to offer the patient complex care management information and services.   Encounter Outcome:  No Answer   Care Coordination Interventions:  No, not indicated    Montrae Braithwaite L. [name], RN, BSN, CCM [place]  [facility], [facility] RN Care Manager Direct Dial: [phone]  Fax: [phone]

## 2023-04-29 ENCOUNTER — Encounter (INDEPENDENT_AMBULATORY_CARE_PROVIDER_SITE_OTHER): Payer: Self-pay

## 2023-04-30 ENCOUNTER — Ambulatory Visit: Payer: Self-pay | Admitting: *Deleted

## 2023-04-30 NOTE — Patient Instructions (Signed)
 Visit Information  Thank you for taking time to visit with me today. Please don't hesitate to contact me if I can be of assistance to you.   Following are the goals we discussed today:   Goals Addressed             This Visit's Progress    COMPLETED: Obtain Counseling, Supportive Services, & Resources.   On track    Care Coordination Interventions:  Interventions Today    Flowsheet Row Most Recent Value  Chronic Disease   Chronic disease during today's visit Other, Atrial Fibrillation (AFib)  [Alzheimer's/Dementia w/ Behavioral Disturbance, Caregiver Burnout/Stress/Fatigue, Inability to Perform Activities of Daily Living Independently, Inappropriate/Argumentative Behavior, Shuffling Feet, Unsteady Balance/Gait, Chronic Prescription Opiate Use.]  General Interventions   General Interventions Discussed/Reviewed General Interventions Discussed, General Interventions Reviewed, Doctor Visits, Communication with, Level of Care, Community Resources  [Encouraged Routine Engagement with Care Team Members & Providers.]  Doctor Visits Discussed/Reviewed Doctor Visits Discussed, Specialist, Doctor Visits Reviewed, Annual Wellness Visits, PCP  [Encouraged Routine Engagement with Care Team Members & Providers.]  PCP/Specialist Visits Compliance with follow-up visit  [Encouraged Routine Engagement with Care Team Members & Providers.]  Contacted provider for referral to PCP, Specialist, Dentist, Ophthalmologist  [Encouraged Routine Engagement with Care Team Members & Providers.]  Communication with PCP/Specialists, RN, Pharmacists, Social Work  [employer] Routine Engagement with Care Team Members & Providers.]  Level of Care Adult Daycare, Assisted Living, Skilled Nursing Facility  [Confirmed Disinterest in Enrollment in Adult Day Care Program. Confirmed Disinterest in Receiving Assistance Pursuing Higher Level of Care Placement Options (I.e Memory Care, Assisted Living, Versus Skilled Nursing Facility).]   Applications Medicaid, Personal Care Services  [Confirmed Interest in Applying for Mediciaid & Personal Care Services, Mailing Applications & Offering to Assist with Completion.]  Education Interventions   Education Provided Provided Education  [Thoroughly Reviewed Educational Material to Ensure Understanding & Entertain Questions.]  Provided Verbal Education On Mental Health/Coping with Illness, When to see the doctor, Community Resources  [Encouraged Wife to [employer] Provider to Request List of Preferred Providers for [employer], Agreeing to Obtain Order from Primary Care Provider & Submit to Agency of Choice.]  Applications Medicaid, Personal Care Services  [Confirmed Interest in Applying for Mediciaid & Personal Care Services, Mailing Applications & Offering to Assist with Completion.]  Mental Health Interventions   Mental Health Discussed/Reviewed Mental Health Discussed, Anxiety, Depression, Grief and Loss, Mental Health Reviewed, Substance Abuse, Coping Strategies, Suicide, Crisis, Other  [Assessed Mental Health & Cognitive Status. Provided Counseling & Supportive Services. Encouraged Wife's Self-Enrollment in Caregiver Support Group of Interest, from List Provided.]  Safety Interventions   Safety Discussed/Reviewed Safety Discussed, Safety Reviewed  [Encouraged Routine Use of Assistive Devices & Durable Medical Equipment.]  Home Safety Assistive Devices, Need for home safety assessment  [Encouraged Consideration of Home Safety Evaluation.]      Active Listening & Reflection Utilized. Verbalization of Feelings Encouraged. Emotional Support Provided. Client-Centered Therapy Indicated. Cognitive Behavioral Therapy Performed. Acceptance & Commitment Therapy Initiated. CSW Collaboration with Wife, Darl Pikes Ryant to [employer] in Completing Advanced Directives (Living Will & Healthcare Power of Attorney Documents), Emailing Packet on 04/30/2023. CSW Collaboration  with Wife, Ranbir Chew to Confirm Interest in Arranging Home Health Physical & Occupational Therapy Services to Determine Eligibility for Short-Term Rehabilitative Services in Skilled Nursing Facility. CSW Collaboration with Dr. Dwana Melena, Primary Care Provider, Via Routed Note in Epic, to Request Order for Home Health Physical Therapist & Occupational Therapist Be Faxed to  [facility], Per Your Request. CSW Collaboration with Wife, Dallas Torok to Encourage Engagement with Danford Bad, Licensed Clinical Social Worker with [facility], [facility] [phone]), if She Has Questions, Needs Assistance, Additional Social Work Needs Are Identified in The Near Future, or If You Change Your Mind About Wanting to Receive Social Work Services.      Please call the care guide team at [phone] if you need to cancel or reschedule your appointment.   If you are experiencing a Mental Health or Behavioral Health Crisis or need someone to talk to, please call the Suicide and Crisis Lifeline: 988 call the [place] National Suicide Prevention Lifeline: 8186910542 or TTY: 2566807238 TTY (208) 083-9358) to talk to a trained counselor call 1-800-273-TALK (toll free, 24 hour hotline) go to [facility] [address], [place] [phone]) call the [facility] Crisis Line: [phone] call 911  Patient verbalizes understanding of instructions and care plan provided today and agrees to view in MyChart. Active MyChart status and patient understanding of how to access instructions and care plan via MyChart confirmed with patient.     No further follow up required.   Danford Bad, BSW, MSW, LCSW [facility], [facility] Clinical Social Worker II Direct Dial: [phone]  Fax: [phone] Website: [email or web address]

## 2023-04-30 NOTE — Patient Outreach (Signed)
 Care Coordination   Follow Up Visit Note   04/30/2023  Name: WINDSOR GOEKEN MRN: [id number] DOB: 1955-04-27  Pearletha Furl Mcswain is a 68 y.o. year old male who sees Margo Aye, Kathleene Hazel, MD for primary care. I spoke with patient's wife, Bocephus Cali by phone today.  What matters to the patients health and wellness today?  Obtain Counseling, Supportive Services, & Resources.    Goals Addressed             This Visit's Progress    COMPLETED: Obtain Counseling, Supportive Services, & Resources.   On track    Care Coordination Interventions:  Interventions Today    Flowsheet Row Most Recent Value  Chronic Disease   Chronic disease during today's visit Other, Atrial Fibrillation (AFib)  [Alzheimer's/Dementia w/ Behavioral Disturbance, Caregiver Burnout/Stress/Fatigue, Inability to Perform Activities of Daily Living Independently, Inappropriate/Argumentative Behavior, Shuffling Feet, Unsteady Balance/Gait, Chronic Prescription Opiate Use.]  General Interventions   General Interventions Discussed/Reviewed General Interventions Discussed, General Interventions Reviewed, Doctor Visits, Communication with, Level of Care, Community Resources  [Encouraged Routine Engagement with Care Team Members & Providers.]  Doctor Visits Discussed/Reviewed Doctor Visits Discussed, Specialist, Doctor Visits Reviewed, Annual Wellness Visits, PCP  [Encouraged Routine Engagement with Care Team Members & Providers.]  PCP/Specialist Visits Compliance with follow-up visit  [Encouraged Routine Engagement with Care Team Members & Providers.]  Contacted provider for referral to PCP, Specialist, Dentist, Ophthalmologist  [Encouraged Routine Engagement with Care Team Members & Providers.]  Communication with PCP/Specialists, RN, Pharmacists, Social Work  [employer] Routine Engagement with Care Team Members & Providers.]  Level of Care Adult Daycare, Assisted Living, Skilled Nursing Facility  [Confirmed Disinterest in  Enrollment in Adult Day Care Program. Confirmed Disinterest in Receiving Assistance Pursuing Higher Level of Care Placement Options (I.e Memory Care, Assisted Living, Versus Skilled Nursing Facility).]  Applications Medicaid, Personal Care Services  [Confirmed Interest in Applying for Mediciaid & Personal Care Services, Mailing Applications & Offering to Assist with Completion.]  Education Interventions   Education Provided Provided Education  [Thoroughly Reviewed Educational Material to Ensure Understanding & Entertain Questions.]  Provided Verbal Education On Mental Health/Coping with Illness, When to see the doctor, Community Resources  [Encouraged Wife to [employer] Provider to Request List of Preferred Providers for [employer], Agreeing to Obtain Order from Primary Care Provider & Submit to Agency of Choice.]  Applications Medicaid, Personal Care Services  [Confirmed Interest in Applying for Mediciaid & Personal Care Services, Mailing Applications & Offering to Assist with Completion.]  Mental Health Interventions   Mental Health Discussed/Reviewed Mental Health Discussed, Anxiety, Depression, Grief and Loss, Mental Health Reviewed, Substance Abuse, Coping Strategies, Suicide, Crisis, Other  [Assessed Mental Health & Cognitive Status. Provided Counseling & Supportive Services. Encouraged Wife's Self-Enrollment in Caregiver Support Group of Interest, from List Provided.]  Safety Interventions   Safety Discussed/Reviewed Safety Discussed, Safety Reviewed  [Encouraged Routine Use of Assistive Devices & Durable Medical Equipment.]  Home Safety Assistive Devices, Need for home safety assessment  [Encouraged Consideration of Home Safety Evaluation.]      Active Listening & Reflection Utilized. Verbalization of Feelings Encouraged. Emotional Support Provided. Client-Centered Therapy Indicated. Cognitive Behavioral Therapy Performed. Acceptance & Commitment Therapy Initiated. CSW  Collaboration with Wife, Darl Pikes Saxe to [employer] in Completing Advanced Directives (Living Will & Healthcare Power of Attorney Documents), Emailing Packet on 04/30/2023. CSW Collaboration with Wife, Harshaan Whang to Confirm Interest in Arranging Home Health Physical & Occupational Therapy Services to Determine Eligibility for Short-Term Rehabilitative Services  in Skilled Nursing Facility. CSW Collaboration with Dr. Dwana Melena, Primary Care Provider, Via Routed Note in Epic, to Request Order for Home Health Physical Therapist & Occupational Therapist Be Faxed to [facility], Per Your Request. CSW Collaboration with Wife, Lucien Budney to Encourage Engagement with Danford Bad, Licensed Clinical Social Worker with [facility], [facility] [phone]), if She Has Questions, Needs Assistance, Additional Social Work Needs Are Identified in The Near Future, or If You Change Your Mind About Wanting to Receive Social Work Services.      SDOH assessments and interventions completed:  Yes.  Care Coordination Interventions:  Yes, provided.   Follow up plan: LCSW colleague will follow-up with patient and wife.  Encounter Outcome:  Patient Visit Completed.

## 2023-05-05 ENCOUNTER — Ambulatory Visit: Admitting: Internal Medicine

## 2023-05-07 NOTE — Progress Notes (Signed)
 Remote pacemaker transmission.

## 2023-05-18 ENCOUNTER — Other Ambulatory Visit: Payer: Self-pay | Admitting: Internal Medicine

## 2023-05-18 DIAGNOSIS — I480 Paroxysmal atrial fibrillation: Secondary | ICD-10-CM

## 2023-05-18 NOTE — Telephone Encounter (Signed)
 Prescription refill request for Eliquis received. Indication:Afib  Last office visit: 01/07/22 Jonathan Poole)  Scr: 0.94 (03/19/23)  Age: 69 Weight: 77.1kg  Office visit overdue. Pt has scheduled appt with Dr Jonathan Poole on 06/29/23

## 2023-05-19 DIAGNOSIS — Z7689 Persons encountering health services in other specified circumstances: Secondary | ICD-10-CM | POA: Diagnosis not present

## 2023-05-19 DIAGNOSIS — E785 Hyperlipidemia, unspecified: Secondary | ICD-10-CM | POA: Diagnosis not present

## 2023-05-19 DIAGNOSIS — R7301 Impaired fasting glucose: Secondary | ICD-10-CM | POA: Diagnosis not present

## 2023-05-19 DIAGNOSIS — Z125 Encounter for screening for malignant neoplasm of prostate: Secondary | ICD-10-CM | POA: Diagnosis not present

## 2023-05-19 DIAGNOSIS — D510 Vitamin B12 deficiency anemia due to intrinsic factor deficiency: Secondary | ICD-10-CM | POA: Diagnosis not present

## 2023-05-21 ENCOUNTER — Encounter: Payer: Self-pay | Admitting: *Deleted

## 2023-05-21 ENCOUNTER — Other Ambulatory Visit: Payer: Self-pay | Admitting: *Deleted

## 2023-05-21 NOTE — Patient Instructions (Signed)
 Visit Information  Thank you for taking time to visit with me today. Please don't hesitate to contact me if I can be of assistance to you before our next scheduled appointment.  Your next care management appointment is by telephone on 06/04/23 at 3:45 pm     Please call the care guide team at [phone] if you need to cancel, schedule, or reschedule an appointment.   Please call the Suicide and Crisis Lifeline: 988 call the [place] National Suicide Prevention Lifeline: (618)324-7578 or TTY: 5128485399 TTY 510-050-3568) to talk to a trained counselor call 1-800-273-TALK (toll free, 24 hour hotline) call the [facility]: ([phone] call 911 if you are experiencing a Mental Health or Behavioral Health Crisis or need someone to talk to.   Garland Smouse L. [name], RN, BSN, CCM [facility], [facility] RN Care Manager Direct Dial: [phone]  Fax: [phone]

## 2023-05-21 NOTE — Patient Outreach (Addendum)
 Complex Care Management   Visit Note  05/21/2023  Name:  Jonathan Poole MRN: [id number] DOB: [birth year]  Situation: Referral received for Complex Care Management related to Dementia and Atrial Fibrillation I obtained verbal consent from susan Hinostroza.  Visit completed with susan Amato  on the phone  Background:   Past Medical History:  Diagnosis Date   Atrial fibrillation (HCC)    Dementia (HCC)    Heart disease    Hyperchloremia     Assessment: Patient Reported Symptoms:  Cognitive Confused or disoriented, Incoherent or nonsensical speech, Poor judgment in daily scenarios, Requires Assistance Decision Making, Struggling with memory recall (disoriented to time & day, thinks it is near thanksgiving in April 2025, "everything leads to an argument with any conversation")  Neurological Dizziness, Hearing changes    HEENT Change or loss of hearing    Cardiovascular Dizziness, Lightheadness    Respiratory No symptoms reported    Endocrine No symptoms reported    Gastrointestinal Change in appetite    Genitourinary No symptoms reported    Integumentary No symptoms reported    Musculoskeletal Difficulty walking, Unsteady gait, Weakness    Psychosocial Depression - if selected complete PHQ 2-9, Alteration in eating habits     There were no vitals filed for this visit.  Medications Reviewed Today     Reviewed by Clinton Gallant, RN (Registered Nurse) on 05/21/23 at 1521  Med List Status: <None>   Medication Order Taking? Sig Documenting Provider Last Dose Status Informant  acetaminophen (TYLENOL) 500 MG tablet [id number] No Take 1,000 mg by mouth every 6 (six) hours as needed for moderate pain or headache. [provider] Taking Active Spouse/Significant Other  apixaban (ELIQUIS) 5 MG TABS tablet [id number]  Take 1 tablet (5 mg total) by mouth 2 (two) times daily. Marinus Maw, MD  Active   Buprenorphine HCl-Naloxone HCl 8-2 MG FILM [id number]   [provider]  Active   donepezil (ARICEPT) 10 MG tablet [id number] No TAKE 1 TABLET AT BEDTIME Anabel Halon, MD Taking Active   gabapentin (NEURONTIN) 600 MG tablet [id number] No Take 600 mg by mouth 3 (three) times daily. [provider] Taking Active   morphine (MS CONTIN) 30 MG 12 hr tablet [id number] No Take 30 mg by mouth every 8 (eight) hours. [provider] Taking Active   naloxone [facility]) nasal spray 4 mg/0.1 mL [id number] No SMARTSIG:Both Nares [provider] Taking Active   ondansetron (ZOFRAN) 4 MG tablet [id number] No Take 1 tablet (4 mg total) by mouth every 8 (eight) hours as needed for nausea or vomiting. Anabel Halon, MD Taking Active   Oxycodone HCl 10 MG TABS [id number] No Take 10 mg by mouth 4 (four) times daily as needed. [provider] Taking Active   REXULTI 2 MG TABS tablet [id number]  1 tablet Orally Once a day for 30 days [provider]  Active   rosuvastatin (CRESTOR) 20 MG tablet [id number] No TAKE 1 TABLET ONE TIME DAILY Anabel Halon, MD Taking Active   tiZANidine (ZANAFLEX) 4 MG tablet [id number] No Take 1 tablet (4 mg total) by mouth 3 (three) times daily. Gilmore Laroche, FNP Taking Active             Recommendation:   PCP Follow-up See Micael Hampshire on 05/25/23  Follow Up Plan:   Telephone follow up appointment date/time:  06/04/23 3:45 pm Collaboration with pcp office for orders to assist with home services or rehab facility stay  Aury Scollard L. [name], RN, BSN, CCM   [facility], [facility] RN Care Manager Direct Dial: [phone]  Fax: [phone]

## 2023-05-25 DIAGNOSIS — R296 Repeated falls: Secondary | ICD-10-CM | POA: Diagnosis not present

## 2023-05-25 DIAGNOSIS — G894 Chronic pain syndrome: Secondary | ICD-10-CM | POA: Diagnosis not present

## 2023-05-25 DIAGNOSIS — I482 Chronic atrial fibrillation, unspecified: Secondary | ICD-10-CM | POA: Diagnosis not present

## 2023-05-25 DIAGNOSIS — E785 Hyperlipidemia, unspecified: Secondary | ICD-10-CM | POA: Diagnosis not present

## 2023-05-25 DIAGNOSIS — E538 Deficiency of other specified B group vitamins: Secondary | ICD-10-CM | POA: Insufficient documentation

## 2023-05-25 DIAGNOSIS — F331 Major depressive disorder, recurrent, moderate: Secondary | ICD-10-CM | POA: Diagnosis not present

## 2023-05-25 DIAGNOSIS — G309 Alzheimer's disease, unspecified: Secondary | ICD-10-CM | POA: Diagnosis not present

## 2023-05-25 DIAGNOSIS — F0283 Dementia in other diseases classified elsewhere, unspecified severity, with mood disturbance: Secondary | ICD-10-CM | POA: Diagnosis not present

## 2023-05-25 DIAGNOSIS — Z4802 Encounter for removal of sutures: Secondary | ICD-10-CM | POA: Diagnosis not present

## 2023-05-29 DIAGNOSIS — R296 Repeated falls: Secondary | ICD-10-CM | POA: Insufficient documentation

## 2023-05-29 DIAGNOSIS — E878 Other disorders of electrolyte and fluid balance, not elsewhere classified: Secondary | ICD-10-CM | POA: Insufficient documentation

## 2023-06-02 DIAGNOSIS — M5116 Intervertebral disc disorders with radiculopathy, lumbar region: Secondary | ICD-10-CM | POA: Diagnosis not present

## 2023-06-02 DIAGNOSIS — F1120 Opioid dependence, uncomplicated: Secondary | ICD-10-CM | POA: Diagnosis not present

## 2023-06-02 DIAGNOSIS — G894 Chronic pain syndrome: Secondary | ICD-10-CM | POA: Diagnosis not present

## 2023-06-02 DIAGNOSIS — M546 Pain in thoracic spine: Secondary | ICD-10-CM | POA: Diagnosis not present

## 2023-06-02 DIAGNOSIS — M542 Cervicalgia: Secondary | ICD-10-CM | POA: Diagnosis not present

## 2023-06-02 DIAGNOSIS — Z79891 Long term (current) use of opiate analgesic: Secondary | ICD-10-CM | POA: Diagnosis not present

## 2023-06-03 ENCOUNTER — Emergency Department (HOSPITAL_COMMUNITY)

## 2023-06-03 ENCOUNTER — Other Ambulatory Visit: Payer: Self-pay

## 2023-06-03 ENCOUNTER — Inpatient Hospital Stay (HOSPITAL_COMMUNITY)
Admission: EM | Admit: 2023-06-03 | Discharge: 2023-06-06 | DRG: 101 | Disposition: A | Discharge status: Home Health | Attending: Family Medicine | Admitting: Family Medicine

## 2023-06-03 DIAGNOSIS — R569 Unspecified convulsions: Secondary | ICD-10-CM | POA: Diagnosis not present

## 2023-06-03 DIAGNOSIS — E785 Hyperlipidemia, unspecified: Secondary | ICD-10-CM | POA: Diagnosis present

## 2023-06-03 DIAGNOSIS — G9340 Encephalopathy, unspecified: Secondary | ICD-10-CM

## 2023-06-03 DIAGNOSIS — Z8249 Family history of ischemic heart disease and other diseases of the circulatory system: Secondary | ICD-10-CM

## 2023-06-03 DIAGNOSIS — I6523 Occlusion and stenosis of bilateral carotid arteries: Secondary | ICD-10-CM | POA: Diagnosis not present

## 2023-06-03 DIAGNOSIS — G4089 Other seizures: Principal | ICD-10-CM | POA: Diagnosis present

## 2023-06-03 DIAGNOSIS — R32 Unspecified urinary incontinence: Secondary | ICD-10-CM | POA: Diagnosis present

## 2023-06-03 DIAGNOSIS — F331 Major depressive disorder, recurrent, moderate: Secondary | ICD-10-CM | POA: Diagnosis present

## 2023-06-03 DIAGNOSIS — R4701 Aphasia: Secondary | ICD-10-CM | POA: Diagnosis present

## 2023-06-03 DIAGNOSIS — I480 Paroxysmal atrial fibrillation: Secondary | ICD-10-CM | POA: Diagnosis present

## 2023-06-03 DIAGNOSIS — Z79891 Long term (current) use of opiate analgesic: Secondary | ICD-10-CM

## 2023-06-03 DIAGNOSIS — G894 Chronic pain syndrome: Secondary | ICD-10-CM | POA: Diagnosis present

## 2023-06-03 DIAGNOSIS — I10 Essential (primary) hypertension: Secondary | ICD-10-CM | POA: Diagnosis not present

## 2023-06-03 DIAGNOSIS — Z950 Presence of cardiac pacemaker: Secondary | ICD-10-CM | POA: Diagnosis not present

## 2023-06-03 DIAGNOSIS — W19XXXA Unspecified fall, initial encounter: Secondary | ICD-10-CM | POA: Diagnosis not present

## 2023-06-03 DIAGNOSIS — F0280 Dementia in other diseases classified elsewhere without behavioral disturbance: Secondary | ICD-10-CM | POA: Diagnosis present

## 2023-06-03 DIAGNOSIS — Z1152 Encounter for screening for COVID-19: Secondary | ICD-10-CM

## 2023-06-03 DIAGNOSIS — D696 Thrombocytopenia, unspecified: Secondary | ICD-10-CM | POA: Diagnosis present

## 2023-06-03 DIAGNOSIS — G459 Transient cerebral ischemic attack, unspecified: Secondary | ICD-10-CM | POA: Diagnosis not present

## 2023-06-03 DIAGNOSIS — R404 Transient alteration of awareness: Principal | ICD-10-CM

## 2023-06-03 DIAGNOSIS — R17 Unspecified jaundice: Secondary | ICD-10-CM | POA: Diagnosis present

## 2023-06-03 DIAGNOSIS — Z87891 Personal history of nicotine dependence: Secondary | ICD-10-CM | POA: Diagnosis not present

## 2023-06-03 DIAGNOSIS — I482 Chronic atrial fibrillation, unspecified: Secondary | ICD-10-CM | POA: Diagnosis present

## 2023-06-03 DIAGNOSIS — S066X0A Traumatic subarachnoid hemorrhage without loss of consciousness, initial encounter: Secondary | ICD-10-CM | POA: Diagnosis not present

## 2023-06-03 DIAGNOSIS — Z8042 Family history of malignant neoplasm of prostate: Secondary | ICD-10-CM

## 2023-06-03 DIAGNOSIS — G309 Alzheimer's disease, unspecified: Secondary | ICD-10-CM | POA: Diagnosis present

## 2023-06-03 DIAGNOSIS — Z7901 Long term (current) use of anticoagulants: Secondary | ICD-10-CM | POA: Diagnosis not present

## 2023-06-03 DIAGNOSIS — M51369 Other intervertebral disc degeneration, lumbar region without mention of lumbar back pain or lower extremity pain: Secondary | ICD-10-CM | POA: Diagnosis present

## 2023-06-03 DIAGNOSIS — I495 Sick sinus syndrome: Secondary | ICD-10-CM | POA: Diagnosis present

## 2023-06-03 DIAGNOSIS — E782 Mixed hyperlipidemia: Secondary | ICD-10-CM | POA: Diagnosis present

## 2023-06-03 DIAGNOSIS — R270 Ataxia, unspecified: Secondary | ICD-10-CM | POA: Diagnosis not present

## 2023-06-03 DIAGNOSIS — F0283 Dementia in other diseases classified elsewhere, unspecified severity, with mood disturbance: Secondary | ICD-10-CM | POA: Diagnosis present

## 2023-06-03 DIAGNOSIS — Z79899 Other long term (current) drug therapy: Secondary | ICD-10-CM | POA: Diagnosis not present

## 2023-06-03 LAB — CBC
HCT: 39.8 % (ref 39.0–52.0)
Hemoglobin: 13.5 g/dL (ref 13.0–17.0)
MCH: 29.8 pg (ref 26.0–34.0)
MCHC: 33.9 g/dL (ref 30.0–36.0)
MCV: 87.9 fL (ref 80.0–100.0)
Platelets: 130 10*3/uL — ABNORMAL LOW (ref 150–400)
RBC: 4.53 MIL/uL (ref 4.22–5.81)
RDW: 12.1 % (ref 11.5–15.5)
WBC: 5.3 10*3/uL (ref 4.0–10.5)
nRBC: 0 % (ref 0.0–0.2)

## 2023-06-03 LAB — COMPREHENSIVE METABOLIC PANEL WITH GFR
ALT: 38 U/L (ref 0–44)
AST: 27 U/L (ref 15–41)
Albumin: 4.1 g/dL (ref 3.5–5.0)
Alkaline Phosphatase: 53 U/L (ref 38–126)
Anion gap: 14 (ref 5–15)
BUN: 17 mg/dL (ref 8–23)
CO2: 24 mmol/L (ref 22–32)
Calcium: 9.2 mg/dL (ref 8.9–10.3)
Chloride: 99 mmol/L (ref 98–111)
Creatinine, Ser: 0.99 mg/dL (ref 0.61–1.24)
GFR, Estimated: >60 mL/min (ref >60)
Glucose, Bld: 94 mg/dL (ref 70–99)
Potassium: 4.5 mmol/L (ref 3.5–5.1)
Sodium: 137 mmol/L (ref 135–145)
Total Bilirubin: 1.7 mg/dL — ABNORMAL HIGH (ref 0.0–1.2)
Total Protein: 6.9 g/dL (ref 6.5–8.1)

## 2023-06-03 LAB — CBG MONITORING, ED: Glucose-Capillary: 90 mg/dL (ref 70–99)

## 2023-06-03 NOTE — ED Triage Notes (Addendum)
 [facility] from home. CC of AMS. Wife called thinking he was having a stoke. He fell Sunday and hit the back of his head taking the trash out. On Eliquis . Havent acted since. Forgetful. Unable to answer orientation questions.  Recently diagnosed with dementia.  Has also been urinating the bed recently.  Wife is on the way.

## 2023-06-03 NOTE — ED Notes (Addendum)
 Pt son and wife called for an update on pt's status. Family were told that labs and scans has been resulted back and that we're waiting on the EDP to assess. Family states that's they may be open to a nursing care facility/ "Pt being evaluated". Family didn't seem to understand process of TOC and finding placement for pt and what that means. After explaining family opted to call back later once assessment is done to talk next steps.

## 2023-06-04 ENCOUNTER — Inpatient Hospital Stay (HOSPITAL_COMMUNITY)
Admit: 2023-06-04 | Discharge: 2023-06-04 | Disposition: A | Discharge status: Home / Self Care | Attending: Family Medicine | Admitting: Family Medicine

## 2023-06-04 ENCOUNTER — Emergency Department (HOSPITAL_COMMUNITY)

## 2023-06-04 ENCOUNTER — Encounter (HOSPITAL_COMMUNITY): Payer: Self-pay | Admitting: Family Medicine

## 2023-06-04 ENCOUNTER — Other Ambulatory Visit: Payer: Self-pay | Admitting: *Deleted

## 2023-06-04 ENCOUNTER — Encounter: Payer: Self-pay | Admitting: *Deleted

## 2023-06-04 DIAGNOSIS — Z950 Presence of cardiac pacemaker: Secondary | ICD-10-CM | POA: Diagnosis not present

## 2023-06-04 DIAGNOSIS — Z79891 Long term (current) use of opiate analgesic: Secondary | ICD-10-CM | POA: Diagnosis not present

## 2023-06-04 DIAGNOSIS — Z79899 Other long term (current) drug therapy: Secondary | ICD-10-CM | POA: Diagnosis not present

## 2023-06-04 DIAGNOSIS — R569 Unspecified convulsions: Secondary | ICD-10-CM

## 2023-06-04 DIAGNOSIS — Z87891 Personal history of nicotine dependence: Secondary | ICD-10-CM | POA: Diagnosis not present

## 2023-06-04 DIAGNOSIS — M51369 Other intervertebral disc degeneration, lumbar region without mention of lumbar back pain or lower extremity pain: Secondary | ICD-10-CM | POA: Diagnosis present

## 2023-06-04 DIAGNOSIS — G894 Chronic pain syndrome: Secondary | ICD-10-CM | POA: Diagnosis present

## 2023-06-04 DIAGNOSIS — F331 Major depressive disorder, recurrent, moderate: Secondary | ICD-10-CM | POA: Diagnosis present

## 2023-06-04 DIAGNOSIS — G309 Alzheimer's disease, unspecified: Secondary | ICD-10-CM | POA: Diagnosis present

## 2023-06-04 DIAGNOSIS — F0283 Dementia in other diseases classified elsewhere, unspecified severity, with mood disturbance: Secondary | ICD-10-CM | POA: Diagnosis present

## 2023-06-04 DIAGNOSIS — R17 Unspecified jaundice: Secondary | ICD-10-CM | POA: Diagnosis present

## 2023-06-04 DIAGNOSIS — E785 Hyperlipidemia, unspecified: Secondary | ICD-10-CM | POA: Diagnosis present

## 2023-06-04 DIAGNOSIS — I480 Paroxysmal atrial fibrillation: Secondary | ICD-10-CM | POA: Diagnosis present

## 2023-06-04 DIAGNOSIS — I495 Sick sinus syndrome: Secondary | ICD-10-CM | POA: Diagnosis present

## 2023-06-04 DIAGNOSIS — Z8249 Family history of ischemic heart disease and other diseases of the circulatory system: Secondary | ICD-10-CM | POA: Diagnosis not present

## 2023-06-04 DIAGNOSIS — Z7901 Long term (current) use of anticoagulants: Secondary | ICD-10-CM | POA: Diagnosis not present

## 2023-06-04 DIAGNOSIS — G9340 Encephalopathy, unspecified: Secondary | ICD-10-CM

## 2023-06-04 DIAGNOSIS — R32 Unspecified urinary incontinence: Secondary | ICD-10-CM | POA: Diagnosis present

## 2023-06-04 DIAGNOSIS — R4701 Aphasia: Secondary | ICD-10-CM | POA: Diagnosis present

## 2023-06-04 DIAGNOSIS — Z1152 Encounter for screening for COVID-19: Secondary | ICD-10-CM | POA: Diagnosis not present

## 2023-06-04 DIAGNOSIS — D696 Thrombocytopenia, unspecified: Secondary | ICD-10-CM | POA: Diagnosis present

## 2023-06-04 DIAGNOSIS — R404 Transient alteration of awareness: Secondary | ICD-10-CM | POA: Diagnosis present

## 2023-06-04 DIAGNOSIS — Z8042 Family history of malignant neoplasm of prostate: Secondary | ICD-10-CM | POA: Diagnosis not present

## 2023-06-04 DIAGNOSIS — G4089 Other seizures: Secondary | ICD-10-CM | POA: Diagnosis present

## 2023-06-04 LAB — URINALYSIS, ROUTINE W REFLEX MICROSCOPIC
Bacteria, UA: NONE SEEN
Bilirubin Urine: NEGATIVE
Glucose, UA: NEGATIVE mg/dL
Ketones, ur: 20 mg/dL — AB
Leukocytes,Ua: NEGATIVE
Nitrite: NEGATIVE
Protein, ur: NEGATIVE mg/dL
Specific Gravity, Urine: 1.015 (ref 1.005–1.030)
pH: 5 (ref 5.0–8.0)

## 2023-06-04 MED ORDER — ACETAMINOPHEN 325 MG PO TABS: 650.0000 mg | ORAL_TABLET | Freq: Four times a day (QID) | ORAL | Status: DC | PRN

## 2023-06-04 MED ORDER — BREXPIPRAZOLE 1 MG PO TABS
2.0000 mg | ORAL_TABLET | Freq: Every day | ORAL | Status: DC
  Administered 2023-06-04 – 2023-06-06 (×3): 2 mg via ORAL
  Filled 2023-06-04 (×2): qty 1
  Filled 2023-06-04: qty 2
  Filled 2023-06-04: qty 1

## 2023-06-04 MED ORDER — LEVETIRACETAM IN NACL 1000 MG/100ML IV SOLN
1000.0000 mg | Freq: Two times a day (BID) | INTRAVENOUS | Status: DC
  Administered 2023-06-04 – 2023-06-05 (×2): 1000 mg via INTRAVENOUS
  Filled 2023-06-04 (×3): qty 100

## 2023-06-04 MED ORDER — IOHEXOL 350 MG/ML SOLN
75.0000 mL | Freq: Once | INTRAVENOUS | Status: AC | PRN
  Administered 2023-06-04: 75 mL via INTRAVENOUS

## 2023-06-04 MED ORDER — ONDANSETRON HCL 4 MG PO TABS: 4.0000 mg | ORAL_TABLET | Freq: Four times a day (QID) | ORAL | Status: DC | PRN

## 2023-06-04 MED ORDER — ONDANSETRON HCL 4 MG/2ML IJ SOLN: 4.0000 mg | Freq: Four times a day (QID) | INTRAMUSCULAR | Status: DC | PRN

## 2023-06-04 MED ORDER — LEVETIRACETAM IN NACL 500 MG/100ML IV SOLN: 500.0000 mg | Freq: Two times a day (BID) | INTRAVENOUS | Status: DC

## 2023-06-04 MED ORDER — BUPRENORPHINE HCL-NALOXONE HCL 8-2 MG SL SUBL
1.0000 | SUBLINGUAL_TABLET | Freq: Three times a day (TID) | SUBLINGUAL | Status: DC
  Administered 2023-06-04 – 2023-06-06 (×7): 1 via SUBLINGUAL
  Filled 2023-06-04 (×7): qty 1

## 2023-06-04 MED ORDER — LEVETIRACETAM IN NACL 1000 MG/100ML IV SOLN
1000.0000 mg | INTRAVENOUS | Status: AC
  Administered 2023-06-04 (×2): 1000 mg via INTRAVENOUS
  Filled 2023-06-04 (×2): qty 100

## 2023-06-04 MED ORDER — DONEPEZIL HCL 5 MG PO TABS
10.0000 mg | ORAL_TABLET | Freq: Every day | ORAL | Status: DC
  Administered 2023-06-04 – 2023-06-05 (×2): 10 mg via ORAL
  Filled 2023-06-04 (×2): qty 2

## 2023-06-04 MED ORDER — ROSUVASTATIN CALCIUM 20 MG PO TABS
20.0000 mg | ORAL_TABLET | Freq: Every day | ORAL | Status: DC
  Administered 2023-06-04 – 2023-06-06 (×3): 20 mg via ORAL
  Filled 2023-06-04 (×3): qty 1

## 2023-06-04 MED ORDER — APIXABAN 5 MG PO TABS
5.0000 mg | ORAL_TABLET | Freq: Two times a day (BID) | ORAL | Status: DC
  Administered 2023-06-04 – 2023-06-06 (×5): 5 mg via ORAL
  Filled 2023-06-04 (×5): qty 1

## 2023-06-04 MED ORDER — LORAZEPAM 2 MG/ML IJ SOLN: 4.0000 mg | INTRAMUSCULAR | Status: DC | PRN

## 2023-06-04 MED ORDER — MEMANTINE HCL 10 MG PO TABS
10.0000 mg | ORAL_TABLET | Freq: Two times a day (BID) | ORAL | Status: DC
Start: 2023-06-04 — End: 2023-06-06
  Administered 2023-06-04 – 2023-06-06 (×4): 10 mg via ORAL
  Filled 2023-06-04 (×4): qty 1

## 2023-06-04 MED ORDER — ACETAMINOPHEN 650 MG RE SUPP: 650.0000 mg | Freq: Four times a day (QID) | RECTAL | Status: DC | PRN

## 2023-06-04 MED ORDER — GABAPENTIN 300 MG PO CAPS
600.0000 mg | ORAL_CAPSULE | Freq: Four times a day (QID) | ORAL | Status: DC
  Administered 2023-06-04 – 2023-06-06 (×10): 600 mg via ORAL
  Filled 2023-06-04 (×10): qty 2

## 2023-06-04 NOTE — Progress Notes (Signed)
 Patient remains confused at this time, I informed patient his wife called. He said " I have a wife? I replied yes sir , and he smiled and said " I do don't I " . Patient feeding self at this time. Able to take medications whole without difficulty . Call bell within reach , bed alarm on and seizure pads intact to bed.

## 2023-06-04 NOTE — ED Provider Notes (Signed)
  Provider Note MRN:  [id number]  Arrival date & time: 06/04/23    ED Course and Medical Decision Making  Assumed care from Dr Luberta Ruse at shift change.  See note from prior team for complete details, in brief:  Clinical Course as of 06/04/23 0846  Thu Jun 04, 2023  0708 Handoff JM -68 yo male hx dementia, chronic pain on bup/neurontin , polypharmacy -Worsening confusion last few days -Aphasic, not walking  -Multiple paroxysms of confusion/staring off, incontinence, episode of arm jerking -fell into the trash can Sunday w/ head inj -cannot have MRI 2/2 PPM [SG]  6578 Spoke w/ neuro Dr Alecia Ames, give 2g keppra , then 500bid, get EEG. No MRI 2/2 ppm. Call neuro if EEG abnormal. [SG]    Clinical Course User Index [SG] Russella Courts A, DO    Imaging is stable Pt confused (dementia) Plan admit for seizure workup, start keppra  in ED. Needs EEG.   Admit [facility]   .Critical Care  Performed by: Teddi Favors, DO Authorized by: Teddi Favors, DO   Critical care provider statement:    Critical care time (minutes):  30   Critical care time was exclusive of:  Separately billable procedures and treating other patients   Critical care was necessary to treat or prevent imminent or life-threatening deterioration of the following conditions:  CNS failure or compromise   Critical care was time spent personally by me on the following activities:  Development of treatment plan with patient or surrogate, discussions with consultants, evaluation of patient's response to treatment, examination of patient, ordering and review of laboratory studies, ordering and review of radiographic studies, ordering and performing treatments and interventions, pulse oximetry, re-evaluation of patient's condition, review of old charts and obtaining history from patient or surrogate   Care discussed with: admitting provider     Final Clinical Impressions(s) / ED Diagnoses     ICD-10-CM   1. Transient alteration of  awareness  R40.4     2. Seizure-like activity [facility])  R56.9       ED Discharge Orders     None       Discharge Instructions   None        Teddi Favors, DO 06/04/23 [phone]

## 2023-06-04 NOTE — Plan of Care (Addendum)
 I was called about this patient having recurrent episodes of staring, aphasia, associated with right arm shaking.  He has recently been diagnosed with dementia.  These episodes last for few minutes at a time.  Given this description, in the setting of a recent dementia diagnosis, it is highly concerning for partial seizures.    He is already taking an antiepileptic, gabapentin  800 mg 4 times daily.  I do not think we would get much benefit from increasing this dose.  I would favor starting a second antiepileptic, would consider Keppra  2 g for initial dose followed by 1 g twice daily.  Routine EEG would also be helpful.  With having 5-6 spells over the past few days, I think it would be reasonable to observe to ensure response to treatment.  If he continues to have spells, or there remain any further questions of diagnosis or management, then please consult covering neurologist.   Ann Keto, MD [facility]   If 7pm- 7am, please page neurology on call as listed in AMION.

## 2023-06-04 NOTE — H&P (Signed)
 History and Physical    Patient: Jonathan Poole DOB: 1955/12/24 DOA: 06/03/2023 DOS: the patient was seen and examined on 06/04/2023 PCP: Omie Bickers, MD  Patient coming from: Home  Chief Complaint:  Chief Complaint  Patient presents with   Altered Mental Status   HPI: Jonathan Poole is a 68 y.o. male with a history of dementia, SSS/PAF s/p PPM, HLD who presented to the ED yesterday with episodes of confusion. His wife provides most of the history, including that he fell a couple nights before, hitting his head while taking the trash out and has been more forgetful, at times unresponsive and nonverbal for several minutes at a time. He has been diagnosed with dementia and recently started having bedtime urinary incontinence. They have begun the search for placement. He was reported to have some episodes of right arm shaking during unresponsive episodes. Work up in ED was largely unremarkable including negative scans of the head and cervical spine. Neurology was consulted, due to concern for partial seizures, recommended keppra  load and initiation and EEG.   Review of Systems: unable to review all systems due to the inability of the patient to answer questions. Past Medical History:  Diagnosis Date   Atrial fibrillation (HCC)    Dementia (HCC)    Heart disease    Hyperchloremia    Past Surgical History:  Procedure Laterality Date   PACEMAKER IMPLANT     PPM GENERATOR CHANGEOUT N/A 01/02/2020   Procedure: PPM GENERATOR CHANGEOUT;  Surgeon: Tammie Fall, MD;  Location: [facility] INVASIVE CV LAB;  Service: Cardiovascular;  Laterality: N/A;   Social History:  reports that he quit smoking about 24 years ago. His smoking use included cigarettes. He started smoking about 39 years ago. He has a 15 pack-year smoking history. He has been exposed to tobacco smoke. He has never used smokeless tobacco. He reports that he does not currently use drugs after having used the following  drugs: Marijuana. He reports that he does not drink alcohol.  Allergies  Allergen Reactions   Penicillins     Unknown reaction    Family History  Problem Relation Age of Onset   Heart disease Father    Prostate cancer Paternal Grandfather     Prior to Admission medications   Medication Sig Start Date End Date Taking? Authorizing Provider  acetaminophen  (TYLENOL ) 500 MG tablet Take 1,000 mg by mouth every 6 (six) hours as needed for moderate pain or headache.    [provider]  apixaban  (ELIQUIS ) 5 MG TABS tablet Take 1 tablet (5 mg total) by mouth 2 (two) times daily. 05/18/23   Tammie Fall, MD  Buprenorphine  HCl-Naloxone  HCl 8-2 MG FILM  02/21/23   [provider]  cyanocobalamin  (VITAMIN B12) 1000 MCG/ML injection Inject 1,000 mcg into the muscle every 30 (thirty) days. 03/28/23   [provider]  donepezil  (ARICEPT ) 10 MG tablet TAKE 1 TABLET AT BEDTIME 06/16/22   Meldon Sport, MD  gabapentin  (NEURONTIN ) 800 MG tablet Take 800 mg by mouth 4 (four) times daily. 06/02/23   [provider]  memantine  (NAMENDA ) 10 MG tablet Take 10 mg by mouth 2 (two) times daily. 05/09/23   [provider]  morphine (MS CONTIN) 30 MG 12 hr tablet Take 30 mg by mouth every 8 (eight) hours. 01/28/22   [provider]  naloxone  (NARCAN ) nasal spray 4 mg/0.1 mL SMARTSIG:Both Nares 01/28/22   [provider]  ondansetron  (ZOFRAN ) 4 MG tablet  Take 1 tablet (4 mg total) by mouth every 8 (eight) hours as needed for nausea or vomiting. 02/05/22   Meldon Sport, MD  Oxycodone HCl 10 MG TABS Take 10 mg by mouth 4 (four) times daily as needed. 04/02/21   [provider]  REXULTI  2 MG TABS tablet 1 tablet Orally Once a day for 30 days 12/18/22   [provider]  rosuvastatin  (CRESTOR ) 20 MG tablet TAKE 1 TABLET ONE TIME DAILY 02/26/22   Meldon Sport, MD  tiZANidine  (ZANAFLEX ) 4 MG tablet Take 1 tablet (4 mg total) by mouth 3 (three)  times daily. 07/31/21   Zarwolo, Gloria, FNP    Physical Exam: Vitals:   06/04/23 0105 06/04/23 0715 06/04/23 0830 06/04/23 0903  BP: (!) 140/80 131/79 120/82   Pulse: 76 (!) 49 78   Resp: 18 18 18    Temp: 99.1 F (37.3 C)   98.4 F (36.9 C)  TempSrc:    Oral  SpO2: 98% 97% 95%   Weight:      Height:      Gen: No distress Pulm: Clear, nonlabored  CV: RRR, no MRG GI: Soft, NT, ND, +BS  Neuro: Alert and partially oriented but very slow to answer some questions. No new focal deficits. Ext: Warm, no deformities. Skin: No open wounds on visualized skin   Data Reviewed: CMP entirely normal with the exception of TBili at 1.7. CBC normal with the exception of platelets at 130k. UA microscopy negative.  CT head and cervical spine showed no acute intracranial abnormalities nor cervical fracture or subluxation.   CTA head and neck showed atherosclerosis without evidence of flow limitation nor LVO.  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  - Neurology consulted, concern for partial seizures, loaded keppra  2g, will continue this 1g BID new med and his home gabapentin  QID.  - Routine EEG.  - Seizure precautions, neuro checks - CTA without LVO or infarcts noted. Unable to get MRI with pacemaker at [facility]. Leads [id number] Tendril SDX and 2088TC Tendril STS] as well as generator [phone] [employer] MRI] are all said to be MRI conditional, so can pursue MRI as outpatient (d/w neurology Dr. Alecia Ames).   PAF, sick sinus syndrome, cardiac pacemaker in situ: Last generator change out 01/02/2020 by Dr. Carolynne Citron.  - Continue eliquis                    Alzheimer's disease: Sees Dr Alysia Bachelor, neurologist, reportedly just diagnosed but also reportedly in EMR Dx 2018.  - Will resume confirmed home meds aricept  and namenda  - Delirium precautions            Chronic  pain syndrome, DDD:  - Sees Dr. Lindsay Rho on gabapentin  (04/10/2023 filled regular Rx Gabapentin  600mg  #120 for 30-day supply) and buprenorphine  (05/05/2023 filled Rx written on 04/22/2023 by Lindsay Rho for Buprenorphine -Nalox 8-2mg  Film #90 for 30-day supply). PDMP review results noted. Will reorder based on these findings (CrCl wnl) to avoid withdrawal. Note risk for polypharmacy.   Weakness:  - PT/OT consulted. TOC consulted.  HLD:  - Continue rosuvastatin  20mg             Moderate recurrent major depression:  - Continue rexulti  2mg   Hyperbilirubinemia: No jaundice, no other LFT abnormalities, no RUQ tenderness. ?Gilbert's. Recheck in AM and fractionate if indicated.   Thrombocytopenia:  - Relatively chronic, no bleeding,  ok to continue DOAC. Monitor intermittently to confirm stability. Check IPF.   Advance Care Planning: Full code based on prior status   Consults: Neurology, Dr. Alecia Ames remotely  Family Communication: None at bedside  Severity of Illness: The appropriate patient status for this patient is INPATIENT. Inpatient status is judged to be reasonable and necessary in order to provide the required intensity of service to ensure the patient's safety. The patient's presenting symptoms, physical exam findings, and initial radiographic and laboratory data in the context of their chronic comorbidities is felt to place them at high risk for further clinical deterioration. Furthermore, it is not anticipated that the patient will be medically stable for discharge from the hospital within 2 midnights of admission.   * I certify that at the point of admission it is my clinical judgment that the patient will require inpatient hospital care spanning beyond 2 midnights from the point of admission due to high intensity of service, high risk for further deterioration and high frequency of surveillance required.Wynetta Heckle, MD 06/04/2023 9:09 AM  For on call review  [email or web address].

## 2023-06-04 NOTE — Progress Notes (Signed)
 EEG complete - results pending

## 2023-06-04 NOTE — TOC CM/SW Note (Signed)
 Transition of Care [facility]) - Inpatient Brief Assessment   Patient Details  Name: Jonathan Poole MRN: [id number] Date of Birth: 01/30/1956  [facility] CM/SW Contact:    Grandville Lax, LCSWA Phone Number: 06/04/2023, 3:12 PM   Clinical Narrative: Transition of Care Department [facility]) has reviewed patient and no TOC needs have been identified at this time. We will continue to monitor patient advancement through interdiciplinary progression rounds. If new patient transition needs arise, please place a TOC consult.   Transition of Care Asessment: Insurance and Status: Insurance coverage has been reviewed Patient has primary care physician: Yes Home environment has been reviewed: From home Prior level of function:: independent Prior/Current Home Services: No current home services Social Drivers of Health Review: SDOH reviewed no interventions necessary Readmission risk has been reviewed: Yes Transition of care needs: no transition of care needs at this time

## 2023-06-04 NOTE — Patient Outreach (Signed)
 Complex Care Management   Visit Note  06/04/2023  Name:  Jonathan Poole MRN: [id number] DOB: May 28, 1955  Situation: Referral received for Complex Care Management related to Dementia, Atrial Fibrillation, and history of mixed hyperlipidemia, cardiac pacemaker  I obtained verbal consent from Caregiver.  Visit completed with Encarnacion Harris, wife  on the phone  Background:   Past Medical History:  Diagnosis Date   Atrial fibrillation (HCC)    Dementia (HCC)    Heart disease    Hyperchloremia     Assessment: Patient Reported Symptoms:  Cognitive Cognitive Status: Confused or disoriented, Difficulties with attention and concentration, Incoherent or nonsensical speech, Poor judgment in daily scenarios, Requires Assistance Decision Making, Struggling with memory recall Cognitive/Intellectual Conditions Management [RPT]: Other Other: alzheimer dementia   Health Maintenance Behaviors: Annual physical exam, Sleep adequate Healing Pattern: Average Health Facilitated by: Rest, Pain control  Neurological Neurological Review of Symptoms: Dizziness, Hearing changes, Weakness Neurological Conditions: Alzheimer's disease, Dementia Neurological Management Strategies: Adequate rest, Medication therapy, Routine screening Neurological Self-Management Outcome: 2 (bad)  HEENT HEENT Symptoms Reported: Not assessed      Cardiovascular Cardiovascular Symptoms Reported: Dizziness, Lightheadness Does patient have uncontrolled Hypertension?: No Cardiovascular Conditions: Dysrhythmia, High blood cholesterol, Cardiomyopathy Cardiovascular Management Strategies: Adequate rest, Medical device, Routine screening Cardiovascular Self-Management Outcome: 4 (good) Cardiovascular Comment: leading to falls, pacemaker in situ Eliquis , ezetimibe rosuvastatin   Respiratory Respiratory Symptoms Reported: Not assesed    Endocrine Patient reports the following symptoms related to hypoglycemia or hyperglycemia : Not  assessed    Gastrointestinal Gastrointestinal Symptoms Reported: Not assessed      Genitourinary Genitourinary Symptoms Reported: Not assessed    Integumentary Integumentary Symptoms Reported: Not assessed    Musculoskeletal Musculoskelatal Symptoms Reviewed: Difficulty walking, Unsteady gait, Weakness, Other Other Musculoskeletal Symptoms: history if chronic pain Additional Musculoskeletal Details: Xtampza recently discontinued on 05/25/23 Musculoskeletal Conditions: Back pain, Joint pain, Mobility limited, Unsteady gait Musculoskeletal Management Strategies: Adequate rest, Medication therapy, Routine screening Musculoskeletal Self-Management Outcome: 3 (uncertain) Falls in the past year?: Yes Number of falls in past year: 2 or more Was there an injury with Fall?: Yes (stitches located in the back of his head) Fall Risk Category Calculator: 3 Patient Fall Risk Level: High Fall Risk Patient at Risk for Falls Due to: History of fall(s), Impaired vision, Impaired balance/gait, Impaired mobility, Mental status change Fall risk Follow up: Falls evaluation completed  Psychosocial Psychosocial Symptoms Reported: Not assessed   Behaviors When Feeling Stressed/Fearful: former smoker - quit 2015, on/off 40 years, 40 pack year history Quality of Family Relationships: helpful, stressful Do you feel physically threatened by others?: No      05/21/2023    7:58 PM  Depression screen PHQ 2/9  Trouble concentrating 1  Moving slowly or fidgety/restless 1  Suicidal thoughts 0  Difficult doing work/chores Somewhat difficult    There were no vitals filed for this visit.  Medications Reviewed Today     Reviewed by Arlyce Berger, RN (Registered Nurse) on 06/04/23 at 1806  Med List Status: <None>   Medication Order Taking? Sig Documenting Provider Last Dose Status Informant  acetaminophen  (TYLENOL ) 500 MG tablet [id number] No Take 1,000 mg by mouth every 6 (six) hours as needed for moderate  pain or headache. [provider] Taking Active Spouse/Significant Other, Pharmacy Records  apixaban  (ELIQUIS ) 5 MG TABS tablet [id number] No Take 1 tablet (5 mg total) by mouth 2 (two) times daily. Tammie Fall, MD 06/03/2023 10:00 AM Active Spouse/Significant Other, Pharmacy Records  Buprenorphine  HCl-Naloxone  HCl 8-2 MG FILM [id number] No Take 1 Film by mouth 3 (three) times daily. [provider] 06/03/2023 Morning Active Spouse/Significant Other, Pharmacy Records  cyanocobalamin  (VITAMIN B12) 1000 MCG/ML injection [id number]  Inject 1,000 mcg into the muscle every 30 (thirty) days. [provider]  Active Spouse/Significant Other, Pharmacy Records  donepezil  (ARICEPT ) 10 MG tablet [id number] No TAKE 1 TABLET AT BEDTIME  Patient taking differently: Take 10 mg by mouth 2 (two) times daily.   Meldon Sport, MD 06/03/2023 Morning Active Spouse/Significant Other, Pharmacy Records  gabapentin  (NEURONTIN ) 800 MG tablet [id number] No Take 800 mg by mouth 4 (four) times daily. [provider] 06/03/2023 Morning Active Spouse/Significant Other, Pharmacy Records  memantine  (NAMENDA ) 10 MG tablet [id number] No Take 10 mg by mouth 2 (two) times daily. [provider] 06/03/2023 Morning Active Spouse/Significant Other, Pharmacy Records  naloxone  (NARCAN ) nasal spray 4 mg/0.1 mL [id number] No SMARTSIG:Both Nares [provider] Taking Active Spouse/Significant Other, Pharmacy Records  REXULTI  2 MG TABS tablet [id number] No 1 tablet Orally Once a day for 30 days [provider] 06/03/2023 Morning Active Spouse/Significant Other, Pharmacy Records  rosuvastatin  (CRESTOR ) 20 MG tablet [id number] No TAKE 1 TABLET ONE TIME DAILY Meldon Sport, MD 06/03/2023 Morning Active Spouse/Significant Other, Pharmacy Records  tiZANidine  (ZANAFLEX ) 4 MG tablet [id number] No Take 1 tablet (4 mg total) by mouth 3 (three) times daily. Zarwolo, Gloria, FNP 06/03/2023 Morning Active  Spouse/Significant Other, Pharmacy Records            Recommendation:   PCP Follow-up Collaboration with [facility]   Follow Up Plan:   Telephone follow up appointment date/time:  06/05/23 1000  Terresa Marlett L. Mcarthur Speedy, RN, BSN, CCM [place]  [facility], [facility] RN Care Manager Direct Dial: [phone]  Fax: [phone]

## 2023-06-04 NOTE — Evaluation (Signed)
 Physical Therapy Evaluation Patient Details Name: Jonathan Poole MRN: [id number] DOB: [birth year] Today's Date: 06/04/2023  History of Present Illness  [facility] from home. CC of AMS. Wife called thinking he was having a stoke. He fell Sunday and hit the back of his head taking the trash out. On Eliquis . Havent acted since. Forgetful. Unable to answer orientation questions.   Recently diagnosed with dementia.   Has also been urinating the bed recently.   Clinical Impression  Patient functioning near baseline for functional mobility and gait with good carryover for ambulating in room, hallway without loss of balance or need for an AD. Plan:  Patient discharged from physical therapy to care of nursing for ambulation daily as tolerated for length of stay.       If plan is discharge home, recommend the following: Help with stairs or ramp for entrance   Can travel by private vehicle        Equipment Recommendations None recommended by PT  Recommendations for Other Services       Functional Status Assessment Patient has had a recent decline in their functional status and/or demonstrates limited ability to make significant improvements in function in a reasonable and predictable amount of time     Precautions / Restrictions Precautions Precautions: None Restrictions Weight Bearing Restrictions Per Provider Order: No      Mobility  Bed Mobility Overal bed mobility: Independent                  Transfers Overall transfer level: Independent                      Ambulation/Gait Ambulation/Gait assistance: Modified independent (Device/Increase time) Gait Distance (Feet): 100 Feet Assistive device: None Gait Pattern/deviations: WFL(Within Functional Limits) Gait velocity: near normal     General Gait Details: grossly WFL with good carryover for ambulating in room, hallway without loss of balance or need for an AD  Stairs            Wheelchair Mobility      Tilt Bed    Modified Rankin (Stroke Patients Only)       Balance Overall balance assessment: Independent                                           Pertinent Vitals/Pain Pain Assessment Pain Assessment: No/denies pain    Home Living Family/patient expects to be discharged to:: Private residence Living Arrangements: Spouse/significant other Available Help at Discharge: Family;Available 24 hours/day Type of Home: House Home Access: Level entry       Home Layout: One level Home Equipment: None Additional Comments: information provided by patient    Prior Function Prior Level of Function : Independent/Modified Independent;Driving             Mobility Comments: Community ambulation without AD, drives, "per patient" ADLs Comments: Independent     Extremity/Trunk Assessment   Upper Extremity Assessment Upper Extremity Assessment: Defer to OT evaluation    Lower Extremity Assessment Lower Extremity Assessment: Overall WFL for tasks assessed    Cervical / Trunk Assessment Cervical / Trunk Assessment: Normal  Communication   Communication Communication: No apparent difficulties    Cognition Arousal: Alert Behavior During Therapy: WFL for tasks assessed/performed   PT - Cognitive impairments: No apparent impairments  Following commands: Intact       Cueing Cueing Techniques: Verbal cues     General Comments      Exercises     Assessment/Plan    PT Assessment Patient does not need any further PT services  PT Problem List         PT Treatment Interventions      PT Goals (Current goals can be found in the Care Plan section)  Acute Rehab PT Goals Patient Stated Goal: return home with family to assist PT Goal Formulation: With patient Time For Goal Achievement: 06/04/23 Potential to Achieve Goals: Good    Frequency       Co-evaluation               AM-PAC PT "6 Clicks"  Mobility  Outcome Measure Help needed turning from your back to your side while in a flat bed without using bedrails?: None Help needed moving from lying on your back to sitting on the side of a flat bed without using bedrails?: None Help needed moving to and from a bed to a chair (including a wheelchair)?: None Help needed standing up from a chair using your arms (e.g., wheelchair or bedside chair)?: None Help needed to walk in hospital room?: None Help needed climbing 3-5 steps with a railing? : A Little 6 Click Score: 23    End of Session   Activity Tolerance: Patient tolerated treatment well Patient left: in chair;with chair alarm set Nurse Communication: Mobility status PT Visit Diagnosis: Unsteadiness on feet (R26.81);Other abnormalities of gait and mobility (R26.89);Muscle weakness (generalized) (M62.81)    Time: 3086-5784 PT Time Calculation (min) (ACUTE ONLY): 18 min   Charges:   PT Evaluation $PT Eval Low Complexity: 1 Low PT Treatments $Therapeutic Activity: 8-22 mins PT General Charges $$ ACUTE PT VISIT: 1 Visit         2:23 PM, 06/04/23 Walton Guppy, MPT Physical Therapist with [facility] [phone] office [phone] mobile phone

## 2023-06-04 NOTE — Patient Instructions (Addendum)
 Visit Information  Thank you for taking time to visit with me today. Please don't hesitate to contact me if I can be of assistance to you before our next scheduled appointment.  Your next care management appointment is by telephone on 06/05/23 at 1000  Please answer calls for home care services   Please call the care guide team at [phone] if you need to cancel, schedule, or reschedule an appointment.   Please call the Suicide and Crisis Lifeline: 988 call the [place]  National Suicide Prevention Lifeline: 380-240-7948 or TTY: 408-299-2427 TTY (385)795-1326) to talk to a trained counselor call 1-800-273-TALK (toll free, 24 hour hotline) call the [facility]: 830-773-1457 call 911 if you are experiencing a Mental Health or Behavioral Health Crisis or need someone to talk to.   Torion Hulgan L. Mcarthur Speedy, RN, BSN, CCM [place]  [facility], [facility] RN Care Manager Direct Dial: [phone]  Fax: [phone]

## 2023-06-04 NOTE — ED Provider Notes (Signed)
 [place] EMERGENCY DEPARTMENT AT [facility] Provider Note   CSN: [id number] Arrival date & time: 06/03/23  2042     History  Chief Complaint  Patient presents with   Altered Mental Status    Jonathan Poole is a 68 y.o. male.  68 yo M w/ h/o dementia here with AMS per family. Sounds like the patient fell a few days ago and hasn't been acting ok since then. No family here at time of evaluation but patient has no complaints of pain or feeling unwell at this time.   Discussion with his wife, no recent medication changes.  He did a prescription for increasing his Neurontin  but is not gotten the prescription filled and has not taken the increased dose yet.  She states that the last few days he has not had significant decreased communication.  He has some episodes where he is seeming to be awake but not answering questions.  He is confused at other times.  There is a couple times that he went to sleep and while he was sleeping he would have very labored breathing and he would bring his right arm up to his chest and would shake a little bit.  She states he never had really seen this before.  He had another episode where he did urinate on himself will sit on the edge of the bed being unresponsive.  Also states that his face looks droopy.  She states is not on either side it just looks more long than normal on both sides it seems to be hanging down more than she is used to.   Altered Mental Status      Home Medications Prior to Admission medications   Medication Sig Start Date End Date Taking? Authorizing Provider  acetaminophen  (TYLENOL ) 500 MG tablet Take 1,000 mg by mouth every 6 (six) hours as needed for moderate pain or headache.    [provider]  apixaban  (ELIQUIS ) 5 MG TABS tablet Take 1 tablet (5 mg total) by mouth 2 (two) times daily. 05/18/23   Tammie Fall, MD  Buprenorphine  HCl-Naloxone  HCl 8-2 MG FILM  02/21/23   [provider]  cyanocobalamin   (VITAMIN B12) 1000 MCG/ML injection Inject 1,000 mcg into the muscle every 30 (thirty) days. 03/28/23   [provider]  donepezil  (ARICEPT ) 10 MG tablet TAKE 1 TABLET AT BEDTIME 06/16/22   Meldon Sport, MD  gabapentin  (NEURONTIN ) 800 MG tablet Take 800 mg by mouth 4 (four) times daily. 06/02/23   [provider]  memantine  (NAMENDA ) 10 MG tablet Take 10 mg by mouth 2 (two) times daily. 05/09/23   [provider]  morphine (MS CONTIN) 30 MG 12 hr tablet Take 30 mg by mouth every 8 (eight) hours. 01/28/22   [provider]  naloxone  (NARCAN ) nasal spray 4 mg/0.1 mL SMARTSIG:Both Nares 01/28/22   [provider]  ondansetron  (ZOFRAN ) 4 MG tablet Take 1 tablet (4 mg total) by mouth every 8 (eight) hours as needed for nausea or vomiting. 02/05/22   Meldon Sport, MD  Oxycodone HCl 10 MG TABS Take 10 mg by mouth 4 (four) times daily as needed. 04/02/21   [provider]  REXULTI  2 MG TABS tablet 1 tablet Orally Once a day for 30 days 12/18/22   [provider]  rosuvastatin  (CRESTOR ) 20 MG tablet TAKE 1 TABLET ONE TIME DAILY 02/26/22   Meldon Sport, MD  tiZANidine  (ZANAFLEX ) 4 MG tablet Take 1 tablet (4  mg total) by mouth 3 (three) times daily. 07/31/21   Zarwolo, Gloria, FNP      Allergies    Penicillins    Review of Systems   Review of Systems  Physical Exam Updated Vital Signs BP (!) 140/80   Pulse 76   Temp 99.1 F (37.3 C)   Resp 18   Ht 6' (1.829 m)   Wt 77.1 kg   SpO2 98%   BMI 23.05 kg/m  Physical Exam Vitals and nursing note reviewed.  Constitutional:      Appearance: He is well-developed.  HENT:     Head: Normocephalic and atraumatic.  Cardiovascular:     Rate and Rhythm: Normal rate.  Pulmonary:     Effort: Pulmonary effort is normal. No respiratory distress.  Abdominal:     General: There is no distension.  Musculoskeletal:        General: Normal range of motion.     Cervical back: Normal range of  motion.     Comments: No cervical spine tenderness, thoracic spine tenderness or Lumbar spine tenderness.  No tenderness or pain with palpation and full ROM of all joints in upper and lower extremities.  No ecchymosis or other signs of trauma on back or extremities.  No Pain with AP or lateral compression of ribs.  No Paracervical ttp, paraspinal ttp   Neurological:     Mental Status: He is alert. He is disoriented.     Motor: No weakness.     ED Results / Procedures / Treatments   Labs (all labs ordered are listed, but only abnormal results are displayed) Labs Reviewed  COMPREHENSIVE METABOLIC PANEL WITH GFR - Abnormal; Notable for the following components:      Result Value   Total Bilirubin 1.7 (*)    All other components within normal limits  CBC - Abnormal; Notable for the following components:   Platelets 130 (*)    All other components within normal limits  CBG MONITORING, ED    EKG None  Radiology CT Head Wo Contrast Result Date: 06/03/2023 CLINICAL DATA:  Ataxia and fall.  On Eliquis . EXAM: CT HEAD WITHOUT CONTRAST CT CERVICAL SPINE WITHOUT CONTRAST TECHNIQUE: Multidetector CT imaging of the head and cervical spine was performed following the standard protocol without intravenous contrast. Multiplanar CT image reconstructions of the cervical spine were also generated. RADIATION DOSE REDUCTION: This exam was performed according to the departmental dose-optimization program which includes automated exposure control, adjustment of the mA and/or kV according to patient size and/or use of iterative reconstruction technique. COMPARISON:  03/19/2023 FINDINGS: CT HEAD FINDINGS Brain: No mass,hemorrhage or extra-axial collection. Normal appearance of the parenchyma and CSF spaces. Vascular: Atherosclerotic calcification of the internal carotid arteries at the skull base. No abnormal hyperdensity of the major intracranial arteries or dural venous sinuses. Skull: The visualized skull  base, calvarium and extracranial soft tissues are normal. Sinuses/Orbits: No fluid levels or advanced mucosal thickening of the visualized paranasal sinuses. No mastoid or middle ear effusion. Normal orbits. Other: None. CT CERVICAL SPINE FINDINGS Alignment: No static subluxation. Facets are aligned. Occipital condyles are normally positioned. Skull base and vertebrae: No acute fracture. Soft tissues and spinal canal: No prevertebral fluid or swelling. No visible canal hematoma. Disc levels: No advanced spinal canal or neural foraminal stenosis. Upper chest: No pneumothorax, pulmonary nodule or pleural effusion. Other: Normal visualized paraspinal cervical soft tissues. IMPRESSION: 1. No acute intracranial abnormality. 2. No acute fracture or static subluxation of the cervical spine. Electronically  Signed   By: Juanetta Nordmann M.D.   On: 06/03/2023 22:09   CT Cervical Spine Wo Contrast Result Date: 06/03/2023 CLINICAL DATA:  Ataxia and fall.  On Eliquis . EXAM: CT HEAD WITHOUT CONTRAST CT CERVICAL SPINE WITHOUT CONTRAST TECHNIQUE: Multidetector CT imaging of the head and cervical spine was performed following the standard protocol without intravenous contrast. Multiplanar CT image reconstructions of the cervical spine were also generated. RADIATION DOSE REDUCTION: This exam was performed according to the departmental dose-optimization program which includes automated exposure control, adjustment of the mA and/or kV according to patient size and/or use of iterative reconstruction technique. COMPARISON:  03/19/2023 FINDINGS: CT HEAD FINDINGS Brain: No mass,hemorrhage or extra-axial collection. Normal appearance of the parenchyma and CSF spaces. Vascular: Atherosclerotic calcification of the internal carotid arteries at the skull base. No abnormal hyperdensity of the major intracranial arteries or dural venous sinuses. Skull: The visualized skull base, calvarium and extracranial soft tissues are normal. Sinuses/Orbits:  No fluid levels or advanced mucosal thickening of the visualized paranasal sinuses. No mastoid or middle ear effusion. Normal orbits. Other: None. CT CERVICAL SPINE FINDINGS Alignment: No static subluxation. Facets are aligned. Occipital condyles are normally positioned. Skull base and vertebrae: No acute fracture. Soft tissues and spinal canal: No prevertebral fluid or swelling. No visible canal hematoma. Disc levels: No advanced spinal canal or neural foraminal stenosis. Upper chest: No pneumothorax, pulmonary nodule or pleural effusion. Other: Normal visualized paraspinal cervical soft tissues. IMPRESSION: 1. No acute intracranial abnormality. 2. No acute fracture or static subluxation of the cervical spine. Electronically Signed   By: Juanetta Nordmann M.D.   On: 06/03/2023 22:09    Procedures Procedures    Medications Ordered in ED Medications - No data to display  ED Course/ Medical Decision Making/ A&P Clinical Course as of 06/12/23 0457  Thu Jun 04, 2023  0708 Handoff JM -68 yo male hx dementia, chronic pain on bup/neurontin , polypharmacy -Worsening confusion last few days -Aphasic, not walking  -Multiple paroxysms of confusion/staring off, incontinence, episode of arm jerking -fell into the trash can Sunday w/ head inj -cannot have MRI 2/2 PPM [SG]  8295 Spoke w/ neuro Dr Alecia Ames, give 2g keppra , then 500bid, get EEG. No MRI 2/2 ppm. Call neuro if EEG abnormal. [SG]    Clinical Course User Index [SG] Teddi Favors, DO                                 Medical Decision Making Amount and/or Complexity of Data Reviewed Labs: ordered. Radiology: ordered.  Risk Prescription drug management. Decision regarding hospitalization.   Workup reassuring. No obvious bleed from the fall. No other obvious causes for symptoms. Likely just the dementia.   Patient is disoriented but is able to answer questions about right now appropriately.  He denies any pain or any other symptoms  however based on what his wife is saying it wonder if he has an infection versus some type of atypical seizure/status especially with arm shaking and the droopy face and the episodes of decreased responsiveness.  Not get an MRI because of a pacemaker.  Will complete medical workup and if patient does not seem to be at his baseline probably make sense to bring him in to get a MRI and EEG.  Care transferred pending call back from hospitalization for admission.    Final Clinical Impression(s) / ED Diagnoses Final diagnoses:  None    Rx /  DC Orders ED Discharge Orders     None         Floris Neuhaus, Reymundo Caulk, MD 06/12/23 [phone]

## 2023-06-05 ENCOUNTER — Encounter: Payer: Self-pay | Admitting: *Deleted

## 2023-06-05 ENCOUNTER — Other Ambulatory Visit: Payer: Self-pay | Admitting: *Deleted

## 2023-06-05 DIAGNOSIS — G9340 Encephalopathy, unspecified: Secondary | ICD-10-CM

## 2023-06-05 DIAGNOSIS — R413 Other amnesia: Secondary | ICD-10-CM | POA: Insufficient documentation

## 2023-06-05 DIAGNOSIS — R451 Restlessness and agitation: Secondary | ICD-10-CM | POA: Insufficient documentation

## 2023-06-05 DIAGNOSIS — R569 Unspecified convulsions: Secondary | ICD-10-CM | POA: Diagnosis not present

## 2023-06-05 LAB — COMPREHENSIVE METABOLIC PANEL WITH GFR
ALT: 32 U/L (ref 0–44)
AST: 21 U/L (ref 15–41)
Albumin: 4.4 g/dL (ref 3.5–5.0)
Alkaline Phosphatase: 56 U/L (ref 38–126)
Anion gap: 12 (ref 5–15)
BUN: 16 mg/dL (ref 8–23)
CO2: 27 mmol/L (ref 22–32)
Calcium: 9.3 mg/dL (ref 8.9–10.3)
Chloride: 98 mmol/L (ref 98–111)
Creatinine, Ser: 0.83 mg/dL (ref 0.61–1.24)
GFR, Estimated: >60 mL/min (ref >60)
Glucose, Bld: 118 mg/dL — ABNORMAL HIGH (ref 70–99)
Potassium: 3.8 mmol/L (ref 3.5–5.1)
Sodium: 137 mmol/L (ref 135–145)
Total Bilirubin: 1.3 mg/dL — ABNORMAL HIGH (ref 0.0–1.2)
Total Protein: 7.4 g/dL (ref 6.5–8.1)

## 2023-06-05 LAB — CBC
HCT: 45 % (ref 39.0–52.0)
Hemoglobin: 14.9 g/dL (ref 13.0–17.0)
MCH: 29.7 pg (ref 26.0–34.0)
MCHC: 33.1 g/dL (ref 30.0–36.0)
MCV: 89.6 fL (ref 80.0–100.0)
Platelets: 152 10*3/uL (ref 150–400)
RBC: 5.02 MIL/uL (ref 4.22–5.81)
RDW: 12.4 % (ref 11.5–15.5)
WBC: 7.4 10*3/uL (ref 4.0–10.5)
nRBC: 0 % (ref 0.0–0.2)

## 2023-06-05 LAB — IMMATURE PLATELET FRACTION: Immature Platelet Fraction: 2.2 % (ref 1.2–8.6)

## 2023-06-05 LAB — HIV ANTIBODY (ROUTINE TESTING W REFLEX): HIV Screen 4th Generation wRfx: NONREACTIVE

## 2023-06-05 MED ORDER — LEVETIRACETAM 1000 MG PO TABS: 1000.0000 mg | ORAL_TABLET | Freq: Two times a day (BID) | ORAL | 1 refills | Status: AC

## 2023-06-05 NOTE — TOC Transition Note (Signed)
 Transition of Care [facility]) - Discharge Note   Patient Details  Name: Jonathan Poole MRN: [id number] Date of Birth: 10/04/1955  Transition of Care [facility]) CM/SW Contact:  Grandville Lax, LCSWA Phone Number: 06/05/2023, 12:32 PM  Clinical Narrative:    CSW updated that pts wife had questions about SNF placement. CSW spoke with pts spouse about PT eval and that pt does not qualify for SNF placement and insurance will not cover placement. CSW explained that [facility] services can be set up if needed, she is agreeable and does not have an agency preference. Pts wife has questions about getting assistance with pt, CSW explained that [facility] will be set up and she will need to work with PCP office to get placement if that is needed. CSW to leave placement resources for pts spouse if they are interested in long term placement from the community. MD placed HH orders. Cory with [facility] accepted pts HH referral for [facility] PT/OT/RN/SW/Aide. TOC signing off.   Final next level of care: Home w Home Health Services Barriers to Discharge: Continued Medical Work up   Patient Goals and CMS Choice Patient states their goals for this hospitalization and ongoing recovery are:: reutrn CMS Medicare.gov Compare Post Acute Care list provided to:: Patient Represenative (must comment) Choice offered to / list presented to : Spouse      Discharge Placement                       Discharge Plan and Services Additional resources added to the After Visit Summary for                            HH Arranged: RN, PT, OT, Nurse's Aide, Social Work [facility] Agency: [facility] Date [facility] Contacted: 06/05/23   Representative spoke with at [facility] Agency: [name]  Social Drivers of Health (SDOH) Interventions SDOH Screenings   Food Insecurity: No Food Insecurity (06/04/2023)  Housing: Low Risk  (06/04/2023)  Transportation Needs: No Transportation Needs (06/04/2023)  Utilities: Not At Risk (06/04/2023)  Alcohol  Screen: Low Risk  (04/10/2023)  Depression (PHQ2-9): Low Risk  (05/21/2023)  Financial Resource Strain: Low Risk  (05/21/2023)  Physical Activity: Inactive (04/10/2023)  Social Connections: Unknown (06/04/2023)  Stress: No Stress Concern Present (04/10/2023)  Tobacco Use: Medium Risk (06/04/2023)  Health Literacy: Inadequate Health Literacy (04/10/2023)     Readmission Risk Interventions     No data to display

## 2023-06-05 NOTE — Plan of Care (Signed)

## 2023-06-05 NOTE — Progress Notes (Signed)
 Mobility Specialist Progress Note:    06/05/23 1250  Mobility  Activity Ambulated with assistance in hallway  Level of Assistance Standby assist, set-up cues, supervision of patient - no hands on  Assistive Device None  Distance Ambulated (ft) 350 ft  Range of Motion/Exercises Active;All extremities  Activity Response Tolerated well  Mobility Referral Yes  Mobility visit 1 Mobility  Mobility Specialist Start Time (ACUTE ONLY) 1250  Mobility Specialist Stop Time (ACUTE ONLY) 1310  Mobility Specialist Time Calculation (min) (ACUTE ONLY) 20 min   Pt received in chair, agreeable to mobility. Required SBA to stand and ambulate with no AD. Tolerated well, off balance at the beginning, improved throughout session. Left pt sitting EOB, alarm on. All needs met.  Paeton Latouche Mobility Specialist Please contact via Special educational needs teacher or  Rehab office at [phone]

## 2023-06-05 NOTE — Procedures (Signed)
 Patient Name: Jonathan Poole  MRN: [id number]  Epilepsy Attending: Arleene Lack  Referring Physician/Provider: Wynetta Heckle, MD  Date: 06/04/2023 Duration: 23.52 mins  Patient history: Altered mental status, suspect partial seizures: Waxing/waning with some right arm shaking, unresponsive staring, and an episode of urinary incontinence x1 at home. EEG to evaluate for seizure  Level of alertness: Awake  AEDs during EEG study: LEV  Technical aspects: This EEG study was done with scalp electrodes positioned according to the 10-20 International system of electrode placement. Electrical activity was reviewed with band pass filter of 1-70Hz , sensitivity of 7 uV/mm, display speed of 55mm/sec with a 60Hz  notched filter applied as appropriate. EEG data were recorded continuously and digitally stored.  Video monitoring was available and reviewed as appropriate.  Description: The posterior dominant rhythm consists of 6 Hz activity of moderate voltage (25-35 uV) seen predominantly in posterior head regions, symmetric and reactive to eye opening and eye closing. EEG showed continuous generalized 5 to 6 Hz theta slowing. Photic driving was not seen during photic stimulation. Hyperventilation was not performed.     ABNORMALITY - Continuous slow, generalized - Background slow  IMPRESSION: This study is suggestive of moderate diffuse encephalopathy. No seizures or epileptiform discharges were seen throughout the recording.  Jahziah Simonin O Linus Weckerly

## 2023-06-05 NOTE — Patient Instructions (Signed)
 Visit Information  Thank you for taking time to visit with me today. Please don't hesitate to contact me if I can be of assistance to you before our next scheduled appointment.  Your next care management appointment is by telephone on 06/12/23 at 3:30 pm Please call if you have questions I can answer for you. If you have to leave a message, Please tell the day and time to return your call  Mrs Stockham Please follow the guidance of the hospital [facility] team for placement options.  He will be followed by one of my peers, Hospital liaison who will update me on the plan of care.  An outreach will be attempted on 06/12/23 to check on you and patient  Please call the care guide team at [phone] if you need to cancel, schedule, or reschedule an appointment.   Please call the Suicide and Crisis Lifeline: 988 call the [place]  National Suicide Prevention Lifeline: 816-181-2721 or TTY: (215) 008-3598 TTY 562 780 3575) to talk to a trained counselor call 1-800-273-TALK (toll free, 24 hour hotline) call the [facility]: (502)[phone] call 911 if you are experiencing a Mental Health or Behavioral Health Crisis or need someone to talk to.  Olena Willy L. Mcarthur Speedy, RN, BSN, CCM [place]  [facility], [facility] RN Care Manager Direct Dial: [phone]  Fax: [phone]

## 2023-06-05 NOTE — Progress Notes (Signed)
 Mobility Specialist Progress Note:    06/05/23 0935  Mobility  Activity Ambulated with assistance in hallway  Level of Assistance Modified independent, requires aide device or extra time  Assistive Device None  Distance Ambulated (ft) 800 ft  Range of Motion/Exercises Active;All extremities  Activity Response Tolerated well  Mobility Referral Yes  Mobility visit 1 Mobility  Mobility Specialist Start Time (ACUTE ONLY) 0935  Mobility Specialist Stop Time (ACUTE ONLY) 0955  Mobility Specialist Time Calculation (min) (ACUTE ONLY) 20 min   Pt received in chair, agreeable to mobility. ModI to stand and ambulate with no AD. Tolerated well,asx throughout. Returned pt to chair, alarm on. Call bell in reach, all needs met.  Jonathan Poole Mobility Specialist Please contact via Special educational needs teacher or  Rehab office at [phone]

## 2023-06-05 NOTE — Plan of Care (Signed)

## 2023-06-05 NOTE — Progress Notes (Signed)
 Lm for wife Amalia Badder to return call regarding discharge.

## 2023-06-05 NOTE — Progress Notes (Signed)
 OT Cancellation Note  Patient Details Name: Jonathan Poole MRN: [id number] DOB: [birth year]   Cancelled Treatment:    Reason Eval/Treat Not Completed: OT screened, no needs identified, will sign off. Physical therapy evaluated the pt and notified this OT that the pt was functioning well and was not in need of OT services. Pt will be removed from the OT list.   Thurnell Floss OT, MOT   Thurnell Floss 06/05/2023, 7:58 AM

## 2023-06-05 NOTE — Patient Outreach (Signed)
 Complex Care Management   Visit Note  06/05/2023  Name:  Jonathan Poole MRN: [id number] DOB: [birth year]  Situation: Referral received for Complex Care Management related to Dementia, Atrial Fibrillation, and mixed hyperlipidemia + h/o cardiac pacemaker  I obtained verbal consent from Caregiver and wife, Bobbye Burrow .  Visit completed with Bobbye Burrow when she returned a call to RN CM  on the phone after a message was left at 1007 06/05/23. Bobbye Burrow inquiring about hospital plan of care. Referred her to hospital staff. Patient at [facility]  Background:   Past Medical History:  Diagnosis Date   Atrial fibrillation (HCC)    Dementia (HCC)    Heart disease    Hyperchloremia     Assessment: Patient Reported Symptoms:  Cognitive Cognitive Status: Unable to Assess      Neurological Neurological Review of Symptoms: Not assessed    HEENT HEENT Symptoms Reported: Not assessed      Cardiovascular Cardiovascular Symptoms Reported: Not assessed    Respiratory Respiratory Symptoms Reported: Not assesed    Endocrine Patient reports the following symptoms related to hypoglycemia or hyperglycemia : Not assessed    Gastrointestinal Gastrointestinal Symptoms Reported: Not assessed      Genitourinary Genitourinary Symptoms Reported: Not assessed    Integumentary Integumentary Symptoms Reported: Not assessed    Musculoskeletal Musculoskelatal Symptoms Reviewed: Not assessed        Psychosocial Psychosocial Symptoms Reported: Not assessed            05/21/2023    7:58 PM  Depression screen PHQ 2/9  Trouble concentrating 1  Moving slowly or fidgety/restless 1  Suicidal thoughts 0  Difficult doing work/chores Somewhat difficult    There were no vitals filed for this visit.  Medications Reviewed Today   Medications were not reviewed in this encounter     Recommendation:   PCP Follow-up Recommended wife speak with hospital staff about patient's plan of care. Patient remains at the hospital  at the time of wife's call to RN CM   Follow Up Plan:   Recommend Wife return calls to Inpatient staff Provided her with the Social worker's number [phone]   Gustav Knueppel L. Mcarthur Speedy, RN, BSN, CCM [place]  [facility], [facility] RN Care Manager Direct Dial: [phone]  Fax: [phone]

## 2023-06-05 NOTE — Patient Outreach (Signed)
 Received a message from wife 06/04/23 2037 to confirm patient's 06/04/23 admission late in the evening to [facility] after partial seizure diagnosis encephalopathy. Wife reported a fall a couple of nights ago (?Sunday), hitting his head while taking out the trash, increase forgetful incontinence, at times unresponsive & nonverbal for several minutes at a time. Active with Inpatient [facility] team ? Placement  Shriley Joffe L. Mcarthur Speedy, RN, BSN, CCM   [facility], [facility] RN Care Manager Direct Dial: [phone]  Fax: [phone]

## 2023-06-05 NOTE — Care Management Important Message (Signed)
 Important Message  Patient Details  Name: Jonathan Poole MRN: [id number] Date of Birth: 02-21-1955   Important Message Given:  Yes - Medicare IM     [name] [name] 06/05/2023, 11:36 AM

## 2023-06-05 NOTE — Discharge Summary (Signed)
 Physician Discharge Summary   Patient: Jonathan Poole MRN: [id number] DOB: [birth year]  Admit date:     06/03/2023  Discharge date: 06/05/23  Discharge Physician: Wynetta Heckle   PCP: Omie Bickers, MD   Recommendations at discharge:  Continue regular follow up with PCP in next 1-2 weeks Continue regular follow up with neurology, Dr. 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 Follow up EEG without epileptiform discharges. Suggest outpatient MRI at a center capable of MRI with pacemaker (MRI conditional, see below). Continue plans to pursue memory care placement for patient. Home health RN and CSW ordered to maximize assistance for patient/family at home.  Discharge Diagnoses: Principal Problem:   Seizure [facility]) Active Problems:   Sinus node dysfunction (HCC)   H/O cardiac pacemaker   PAF (paroxysmal atrial fibrillation) (HCC)   Alzheimer's disease (HCC)   DDD (degenerative disc disease), lumbar   Hyperlipidemia   Chronic prescription opiate use   Chronic pain syndrome   Moderate recurrent major depression (HCC)   Hyperbilirubinemia   Thrombocytopenia [facility])  Hospital Course: HPI: Jonathan Poole is a 68 y.o. male with a history of dementia, SSS/PAF s/p PPM, HLD who presented to the ED yesterday with episodes of confusion. His wife provides most of the history, including that he fell a couple nights before, hitting his head while taking the trash out and has been more forgetful, at times unresponsive and nonverbal for several minutes at a time. He has been diagnosed with dementia and recently started having bedtime urinary incontinence. They have begun the search for placement. He was reported to have some episodes of right arm shaking during unresponsive episodes. Work up in ED was largely unremarkable including negative scans of the head and cervical spine. Neurology was consulted, due to  concern for partial seizures, recommended keppra  load and initiation and EEG. There have been no subsequent episodes and he is at his mental and physical baseline.   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  - Neurology consulted, concern for partial seizures, loaded keppra  2g, will continue this 1g BID new med and his home gabapentin  QID.  - Routine EEG generalized slowing without epileptiform discharges. No further shaking or unresponsive spells during admission.  - CTA without LVO or infarcts noted. Unable to get MRI with pacemaker at [facility]. Leads [id number] Tendril SDX and [id number] Tendril STS] as well as generator [phone] [employer] MRI] are all said to be MRI conditional, so can pursue MRI as outpatient (d/w neurology Dr. Alecia Ames).    PAF, sick sinus syndrome, cardiac pacemaker in situ: Last generator change out 01/02/2020 by Dr. Carolynne Citron.  - Continue eliquis   Alzheimer's disease: Sees Dr Alysia Bachelor, neurologist. Note he has had this diagnosis for many years, suspect progression and may need memory care placement at this point. - Will resume confirmed home meds aricept  and namenda                                                               Chronic pain syndrome, DDD:  - Sees Dr. Lindsay Rho on gabapentin  (04/10/2023 filled regular Rx Gabapentin  600mg  #120 for 30-day supply) and buprenorphine  (05/05/2023 filled Rx written on 04/22/2023 by Lindsay Rho for Buprenorphine -Nalox 8-2mg  Film #90 for 30-day supply). PDMP review results noted. Will reorder based on these findings (CrCl wnl) to avoid withdrawal. Note risk for polypharmacy.    Weakness: Pt ambulating without assistance in hallway today, was found to have no PT or OT needs on their  evaluations on day of admission as well.    HLD:  - Continue rosuvastatin  20mg                                                               Moderate recurrent major depression:  - Continue rexulti  2mg    Hyperbilirubinemia: No jaundice, no other LFT abnormalities, no RUQ tenderness, trending down spontaneously. ?Gilbert's. Consider recheck at follow up.   Thrombocytopenia:  - Relatively chronic, no bleeding, and resolved spontaneously. Ok to continue DOAC. Monitor intermittently.  Consultants: Neurology, Dr. Alecia Ames Procedures performed: EEG  Disposition: Home Diet recommendation:  Regular diet DISCHARGE MEDICATION: Allergies as of 06/05/2023       Reactions   Penicillins    Unknown reaction        Medication List     TAKE these medications    acetaminophen  500 MG tablet Commonly known as: TYLENOL  Take 1,000 mg by mouth every 6 (six) hours as needed for moderate pain or headache.   apixaban  5 MG Tabs tablet Commonly known as: Eliquis  Take 1 tablet (5 mg total) by mouth 2 (two) times daily.   Buprenorphine  HCl-Naloxone  HCl 8-2 MG Film Take 1 Film by mouth 3 (three) times daily.   cyanocobalamin  1000 MCG/ML injection Commonly known as: VITAMIN B12 Inject 1,000 mcg into the muscle every 30 (thirty) days.   donepezil  10 MG tablet Commonly known as: ARICEPT  TAKE 1 TABLET AT BEDTIME What changed: when to take this   gabapentin  800 MG tablet Commonly known as: NEURONTIN  Take 800 mg by mouth 4 (four) times daily.   levETIRAcetam  1000 MG tablet Commonly known as: Keppra  Take 1 tablet (1,000 mg total) by mouth 2 (two) times daily.   memantine  10 MG tablet Commonly known as: NAMENDA  Take 10 mg by mouth 2 (two) times daily.   naloxone  4 MG/0.1ML Liqd nasal spray kit Commonly known as: NARCAN  SMARTSIG:Both Nares   Rexulti  2 MG Tabs tablet Generic drug: brexpiprazole  1 tablet Orally Once a day for 30 days   rosuvastatin  20 MG tablet Commonly known as:  CRESTOR  TAKE 1 TABLET ONE TIME DAILY   tiZANidine  4 MG tablet Commonly known as: ZANAFLEX  Take 1 tablet (4 mg total) by mouth 3 (three) times daily.  Follow-up Information     Omie Bickers, MD Follow up.   Specialty: Internal Medicine Contact information: [address] [place] [address] [phone]         Myrtie Atkinson, MD Follow up.   Specialty: Specialist Contact information: [address] [place] [address] [phone]                Discharge Exam: Cleavon Curls Weights   06/03/23 2048  Weight: 77.1 kg  BP (!) 155/81   Pulse 84   Temp 98.7 F (37.1 C) (Oral)   Resp 18   Ht 6' (1.829 m)   Wt 77.1 kg   SpO2 96%   BMI 23.05 kg/m   Nontoxic pleasantly confused gentleman sitting in chair without complaints. No overnight events. Later in the morning seen walking the halls without assistance and steady, narrow gait without focal deficits.   Condition at discharge: stable  The results of significant diagnostics from this hospitalization (including imaging, microbiology, ancillary and laboratory) are listed below for reference.   Imaging Studies: EEG adult Result Date: 06/05/2023 Arleene Lack, MD     06/05/2023  8:42 AM Patient Name: DAMONIE ELLENWOOD MRN: [id number] Epilepsy Attending: Arleene Lack Referring Physician/Provider: Wynetta Heckle, MD Date: 06/04/2023 Duration: 23.52 mins Patient history: Altered mental status, suspect partial seizures: Waxing/waning with some right arm shaking, unresponsive staring, and an episode of urinary incontinence x1 at home. EEG to evaluate for seizure Level of alertness: Awake AEDs during EEG study: LEV Technical aspects: This EEG study was done with scalp electrodes positioned according to the 10-20 International system of electrode placement. Electrical activity was reviewed with band pass filter of 1-70Hz , sensitivity of 7 uV/mm, display speed of 46mm/sec with a 60Hz  notched filter  applied as appropriate. EEG data were recorded continuously and digitally stored.  Video monitoring was available and reviewed as appropriate. Description: The posterior dominant rhythm consists of 6 Hz activity of moderate voltage (25-35 uV) seen predominantly in posterior head regions, symmetric and reactive to eye opening and eye closing. EEG showed continuous generalized 5 to 6 Hz theta slowing. Photic driving was not seen during photic stimulation. Hyperventilation was not performed.   ABNORMALITY - Continuous slow, generalized - Background slow IMPRESSION: This study is suggestive of moderate diffuse encephalopathy. No seizures or epileptiform discharges were seen throughout the recording. Arleene Lack   CT ANGIO HEAD NECK W WO CM Result Date: 06/04/2023 CLINICAL DATA:  Subarachnoid hemorrhage.  Recent fall EXAM: CT ANGIOGRAPHY HEAD AND NECK WITH AND WITHOUT CONTRAST TECHNIQUE: Multidetector CT imaging of the head and neck was performed using the standard protocol during bolus administration of intravenous contrast. Multiplanar CT image reconstructions and MIPs were obtained to evaluate the vascular anatomy. Carotid stenosis measurements (when applicable) are obtained utilizing NASCET criteria, using the distal internal carotid diameter as the denominator. RADIATION DOSE REDUCTION: This exam was performed according to the departmental dose-optimization program which includes automated exposure control, adjustment of the mA and/or kV according to patient size and/or use of iterative reconstruction technique. CONTRAST:  75mL OMNIPAQUE  IOHEXOL  350 MG/ML SOLN COMPARISON:  None Available. FINDINGS: CTA NECK FINDINGS Aortic arch: Atheromatous plaque with 3 vessel branching Right carotid system: Atheromatous plaque especially centered around the bifurcation without stenosis, ulceration, or beading Left carotid system: Atheromatous plaque, primarily calcified and fairly heavy deposition around the bifurcation  without flow reducing stenosis, ulceration, or beading. Vertebral arteries: No proximal subclavian stenosis despite atherosclerosis. Calcified plaque  at the right vertebral origin causing mild narrowing. Minimal flow in the left vertebral artery until the V3 segment. No focal stenosis detected. Skeleton: No acute finding Other neck: No acute finding Upper chest: Clear apical lungs Review of the MIP images confirms the above findings CTA HEAD FINDINGS Anterior circulation: Atheromatous calcification along the cavernous carotids. No branch occlusion, beading, or aneurysm. 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 There is a fetal type left PCA. No aneurysm, branch occlusion, beading, or proximal stenosis. Venous sinuses: Unremarkable Anatomic variants: Fall Review of the MIP images confirms the above findings IMPRESSION: No emergent finding. Atherosclerosis without flow reducing stenosis of major arteries in the head and neck. 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 Electronically Signed   By: Ronnette Coke M.D.   On: 06/04/2023 07:46   CT Head Wo Contrast Result Date: 06/03/2023 CLINICAL DATA:  Ataxia and fall.  On Eliquis . EXAM: CT HEAD WITHOUT CONTRAST CT CERVICAL SPINE WITHOUT CONTRAST TECHNIQUE: Multidetector CT imaging of the head and cervical spine was performed following the standard protocol without intravenous contrast. Multiplanar CT image reconstructions of the cervical spine were also generated. RADIATION DOSE REDUCTION: This exam was performed according to the departmental dose-optimization program which includes automated exposure control, adjustment of the mA and/or kV according to patient size and/or use of iterative reconstruction technique. COMPARISON:  03/19/2023 FINDINGS: CT HEAD FINDINGS Brain: No mass,hemorrhage or extra-axial collection. Normal appearance of the  parenchyma and CSF spaces. Vascular: Atherosclerotic calcification of the internal carotid arteries at the skull base. No abnormal hyperdensity of the major intracranial arteries or dural venous sinuses. Skull: The visualized skull base, calvarium and extracranial soft tissues are normal. Sinuses/Orbits: No fluid levels or advanced mucosal thickening of the visualized paranasal sinuses. No mastoid or middle ear effusion. Normal orbits. Other: None. CT CERVICAL SPINE FINDINGS Alignment: No static subluxation. Facets are aligned. Occipital condyles are normally positioned. Skull base and vertebrae: No acute fracture. Soft tissues and spinal canal: No prevertebral fluid or swelling. No visible canal hematoma. Disc levels: No advanced spinal canal or neural foraminal stenosis. Upper chest: No pneumothorax, pulmonary nodule or pleural effusion. Other: Normal visualized paraspinal cervical soft tissues. IMPRESSION: 1. No acute intracranial abnormality. 2. No acute fracture or static subluxation of the cervical spine. Electronically Signed   By: Juanetta Nordmann M.D.   On: 06/03/2023 22:09   CT Cervical Spine Wo Contrast Result Date: 06/03/2023 CLINICAL DATA:  Ataxia and fall.  On Eliquis . EXAM: CT HEAD WITHOUT CONTRAST CT CERVICAL SPINE WITHOUT CONTRAST TECHNIQUE: Multidetector CT imaging of the head and cervical spine was performed following the standard protocol without intravenous contrast. Multiplanar CT image reconstructions of the cervical spine were also generated. RADIATION DOSE REDUCTION: This exam was performed according to the departmental dose-optimization program which includes automated exposure control, adjustment of the mA and/or kV according to patient size and/or use of iterative reconstruction technique. COMPARISON:  03/19/2023 FINDINGS: CT HEAD FINDINGS Brain: No mass,hemorrhage or extra-axial collection. Normal appearance of the parenchyma and CSF spaces. Vascular: Atherosclerotic calcification of the  internal carotid arteries at the skull base. No abnormal hyperdensity of the major intracranial arteries or dural venous sinuses. Skull: The visualized skull base, calvarium and extracranial soft tissues are normal. Sinuses/Orbits: No fluid levels or advanced mucosal thickening of the visualized paranasal sinuses. No mastoid or middle ear effusion. Normal orbits. Other: None. CT CERVICAL SPINE FINDINGS Alignment: No  static subluxation. Facets are aligned. Occipital condyles are normally positioned. Skull base and vertebrae: No acute fracture. Soft tissues and spinal canal: No prevertebral fluid or swelling. No visible canal hematoma. Disc levels: No advanced spinal canal or neural foraminal stenosis. Upper chest: No pneumothorax, pulmonary nodule or pleural effusion. Other: Normal visualized paraspinal cervical soft tissues. IMPRESSION: 1. No acute intracranial abnormality. 2. No acute fracture or static subluxation of the cervical spine. Electronically Signed   By: Juanetta Nordmann M.D.   On: 06/03/2023 22:09    Microbiology: Results for orders placed or performed in visit on 09/14/18  Novel Coronavirus, NAA ([facility])     Status: None   Collection Time: 09/14/18  2:50 PM   Specimen: Oropharyngeal(OP) collection in vial transport medium  Result Value Ref Range Status   SARS-CoV-2, NAA Not Detected Not Detected Final    Comment: This test was developed and its performance characteristics determined by [employer]. This test has not been FDA cleared or approved. This test has been authorized by FDA under an Emergency Use Authorization (EUA). This test is only authorized for the duration of time the declaration that circumstances exist justifying the authorization of the emergency use of in vitro diagnostic tests for detection of SARS-CoV-2 virus and/or diagnosis of COVID-19 infection under section 564(b)(1) of the Act, 21 U.S.C. 102VOZ-3(G)(6), unless the authorization is terminated or revoked  sooner. When diagnostic testing is negative, the possibility of a false negative result should be considered in the context of a patient's recent exposures and the presence of clinical signs and symptoms consistent with COVID-19. An individual without symptoms of COVID-19 and who is not shedding SARS-CoV-2 virus would expect to have a negative (not detected) result in this assay.     Labs: CBC: Recent Labs  Lab 06/03/23 2109 06/05/23 0425  WBC 5.3 7.4  HGB 13.5 14.9  HCT 39.8 45.0  MCV 87.9 89.6  PLT 130* 152   Basic Metabolic Panel: Recent Labs  Lab 06/03/23 2109 06/05/23 0425  NA 137 137  K 4.5 3.8  CL 99 98  CO2 24 27  GLUCOSE 94 118*  BUN 17 16  CREATININE 0.99 0.83  CALCIUM  9.2 9.3   Liver Function Tests: Recent Labs  Lab 06/03/23 2109 06/05/23 0425  AST 27 21  ALT 38 32  ALKPHOS 53 56  BILITOT 1.7* 1.3*  PROT 6.9 7.4  ALBUMIN 4.1 4.4   CBG: Recent Labs  Lab 06/03/23 2058  GLUCAP 90    Discharge time spent: greater than 30 minutes.  Signed: Wynetta Heckle, MD [facility] 06/05/2023

## 2023-06-06 DIAGNOSIS — G9340 Encephalopathy, unspecified: Secondary | ICD-10-CM | POA: Diagnosis not present

## 2023-06-06 MED ORDER — LEVETIRACETAM 500 MG PO TABS
1000.0000 mg | ORAL_TABLET | Freq: Two times a day (BID) | ORAL | Status: DC
  Administered 2023-06-06: 1000 mg via ORAL
  Filled 2023-06-06: qty 2

## 2023-06-06 NOTE — Progress Notes (Signed)
 Mobility Specialist Progress Note:    06/06/23 0925  Mobility  Activity Ambulated with assistance in hallway  Level of Assistance Modified independent, requires aide device or extra time  Assistive Device None  Distance Ambulated (ft) 600 ft  Range of Motion/Exercises Active;All extremities  Activity Response Tolerated well  Mobility Referral Yes  Mobility visit 1 Mobility  Mobility Specialist Start Time (ACUTE ONLY) [id number]  Mobility Specialist Stop Time (ACUTE ONLY) 0945  Mobility Specialist Time Calculation (min) (ACUTE ONLY) 20 min   Pt received in chair, agreeable to mobility. ModI to stand and ambulate with no AD. Tolerated well, asx throughout. Returned pt to chair, alarm on. Call bell in reach, all needs met.  Kerolos Nehme Mobility Specialist Please contact via Special educational needs teacher or  Rehab office at [phone]

## 2023-06-06 NOTE — Plan of Care (Signed)

## 2023-06-06 NOTE — Progress Notes (Signed)
 Jonathan Poole was discharged yesterday and has remained stable since that time. This morning he made his own bed, is eating, speaking with staff, walked the halls. He has no complaints, though he remains disoriented he is conversant. Staff have not noticed any episode of seizure like activity or unresponsiveness.   He remains stable and safe to be discharged home. He is a good candidate for assisted living/memory care, but has no skilled nursing/rehabilitation needs whatsoever. The inpatient social worker and I both spoke at length to his spouse about the constraints on our ability to offer alternative disposition at this time. We have arranged maximal home health assistance inclusive of HH-CSW and outpatient care manager with [facility]. There are no further needs for this patient to be safely discharged home.  Jearld Min, MD 06/06/2023 11:04 AM

## 2023-06-06 NOTE — Progress Notes (Signed)
 Nsg Discharge Note  Admit Date:  06/03/2023 Discharge date: 06/06/2023   Jonathan Poole to be D/C'd Home per MD order.  AVS completed.   Patient/caregiver able to verbalize understanding.  Discharge Medication: Allergies as of 06/06/2023       Reactions   Penicillins    Unknown reaction        Medication List     TAKE these medications    acetaminophen  500 MG tablet Commonly known as: TYLENOL  Take 1,000 mg by mouth every 6 (six) hours as needed for moderate pain or headache.   apixaban  5 MG Tabs tablet Commonly known as: Eliquis  Take 1 tablet (5 mg total) by mouth 2 (two) times daily.   Buprenorphine  HCl-Naloxone  HCl 8-2 MG Film Take 1 Film by mouth 3 (three) times daily.   cyanocobalamin  1000 MCG/ML injection Commonly known as: VITAMIN B12 Inject 1,000 mcg into the muscle every 30 (thirty) days.   donepezil  10 MG tablet Commonly known as: ARICEPT  TAKE 1 TABLET AT BEDTIME What changed: when to take this   gabapentin  800 MG tablet Commonly known as: NEURONTIN  Take 800 mg by mouth 4 (four) times daily.   levETIRAcetam  1000 MG tablet Commonly known as: Keppra  Take 1 tablet (1,000 mg total) by mouth 2 (two) times daily.   memantine  10 MG tablet Commonly known as: NAMENDA  Take 10 mg by mouth 2 (two) times daily.   naloxone  4 MG/0.1ML Liqd nasal spray kit Commonly known as: NARCAN  SMARTSIG:Both Nares   Rexulti  2 MG Tabs tablet Generic drug: brexpiprazole  1 tablet Orally Once a day for 30 days   rosuvastatin  20 MG tablet Commonly known as: CRESTOR  TAKE 1 TABLET ONE TIME DAILY   tiZANidine  4 MG tablet Commonly known as: ZANAFLEX  Take 1 tablet (4 mg total) by mouth 3 (three) times daily.        Discharge Assessment: Vitals:   06/06/23 1257 06/06/23 1300  BP: 105/77 100/73  Pulse: 73 68  Resp: 18 16  Temp: 98.9 F (37.2 C) 99 F (37.2 C)  SpO2: 97% 94%   Skin clean, dry and intact without evidence of skin break down, no evidence of skin  tears noted. IV catheter discontinued intact. Site without signs and symptoms of complications - no redness or edema noted at insertion site, patient denies c/o pain - only slight tenderness at site.  Dressing with slight pressure applied.  D/c Instructions-Education: Discharge instructions given to patient/family with verbalized understanding. D/c education completed with patient/family including follow up instructions, medication list, d/c activities limitations if indicated, with other d/c instructions as indicated by MD - patient able to verbalize understanding, all questions fully answered. Patient instructed to return to ED, call 911, or call MD for any changes in condition.  Patient escorted via WC, and D/C home via private auto.  Lilton Relic, RN 06/06/2023 2:52 PM

## 2023-06-06 NOTE — Progress Notes (Signed)
 Attempted to call pt wife to inform her that her husband is discharging  today. No answer from pt wife. This nurse left a message for a call back.

## 2023-06-08 DIAGNOSIS — G894 Chronic pain syndrome: Secondary | ICD-10-CM | POA: Diagnosis not present

## 2023-06-08 DIAGNOSIS — G309 Alzheimer's disease, unspecified: Secondary | ICD-10-CM | POA: Diagnosis not present

## 2023-06-08 DIAGNOSIS — I480 Paroxysmal atrial fibrillation: Secondary | ICD-10-CM | POA: Diagnosis not present

## 2023-06-08 DIAGNOSIS — I495 Sick sinus syndrome: Secondary | ICD-10-CM | POA: Diagnosis not present

## 2023-06-08 DIAGNOSIS — M51369 Other intervertebral disc degeneration, lumbar region without mention of lumbar back pain or lower extremity pain: Secondary | ICD-10-CM | POA: Diagnosis not present

## 2023-06-08 DIAGNOSIS — R569 Unspecified convulsions: Secondary | ICD-10-CM | POA: Diagnosis not present

## 2023-06-08 DIAGNOSIS — E785 Hyperlipidemia, unspecified: Secondary | ICD-10-CM | POA: Diagnosis not present

## 2023-06-08 DIAGNOSIS — F331 Major depressive disorder, recurrent, moderate: Secondary | ICD-10-CM | POA: Diagnosis not present

## 2023-06-08 DIAGNOSIS — F0283 Dementia in other diseases classified elsewhere, unspecified severity, with mood disturbance: Secondary | ICD-10-CM | POA: Diagnosis not present

## 2023-06-09 ENCOUNTER — Encounter (HOSPITAL_COMMUNITY): Payer: Self-pay | Admitting: Emergency Medicine

## 2023-06-09 ENCOUNTER — Emergency Department (HOSPITAL_COMMUNITY)
Admission: EM | Admit: 2023-06-09 | Discharge: 2023-06-10 | Disposition: A | Discharge status: Home / Self Care | Attending: Emergency Medicine | Admitting: Emergency Medicine

## 2023-06-09 ENCOUNTER — Emergency Department (HOSPITAL_COMMUNITY)

## 2023-06-09 ENCOUNTER — Other Ambulatory Visit: Payer: Self-pay

## 2023-06-09 DIAGNOSIS — F0390 Unspecified dementia without behavioral disturbance: Secondary | ICD-10-CM | POA: Insufficient documentation

## 2023-06-09 DIAGNOSIS — Z7901 Long term (current) use of anticoagulants: Secondary | ICD-10-CM | POA: Insufficient documentation

## 2023-06-09 DIAGNOSIS — R569 Unspecified convulsions: Secondary | ICD-10-CM | POA: Diagnosis not present

## 2023-06-09 DIAGNOSIS — Z87891 Personal history of nicotine dependence: Secondary | ICD-10-CM | POA: Insufficient documentation

## 2023-06-09 DIAGNOSIS — G309 Alzheimer's disease, unspecified: Secondary | ICD-10-CM | POA: Insufficient documentation

## 2023-06-09 DIAGNOSIS — F331 Major depressive disorder, recurrent, moderate: Secondary | ICD-10-CM | POA: Diagnosis not present

## 2023-06-09 DIAGNOSIS — I495 Sick sinus syndrome: Secondary | ICD-10-CM | POA: Diagnosis not present

## 2023-06-09 DIAGNOSIS — I480 Paroxysmal atrial fibrillation: Secondary | ICD-10-CM | POA: Diagnosis not present

## 2023-06-09 DIAGNOSIS — Z950 Presence of cardiac pacemaker: Secondary | ICD-10-CM | POA: Diagnosis not present

## 2023-06-09 DIAGNOSIS — E785 Hyperlipidemia, unspecified: Secondary | ICD-10-CM | POA: Diagnosis not present

## 2023-06-09 DIAGNOSIS — R21 Rash and other nonspecific skin eruption: Secondary | ICD-10-CM | POA: Diagnosis not present

## 2023-06-09 DIAGNOSIS — F0283 Dementia in other diseases classified elsewhere, unspecified severity, with mood disturbance: Secondary | ICD-10-CM | POA: Diagnosis not present

## 2023-06-09 DIAGNOSIS — G894 Chronic pain syndrome: Secondary | ICD-10-CM | POA: Diagnosis not present

## 2023-06-09 DIAGNOSIS — R531 Weakness: Secondary | ICD-10-CM | POA: Insufficient documentation

## 2023-06-09 DIAGNOSIS — M51369 Other intervertebral disc degeneration, lumbar region without mention of lumbar back pain or lower extremity pain: Secondary | ICD-10-CM | POA: Diagnosis not present

## 2023-06-09 DIAGNOSIS — I6523 Occlusion and stenosis of bilateral carotid arteries: Secondary | ICD-10-CM | POA: Diagnosis not present

## 2023-06-09 DIAGNOSIS — R4182 Altered mental status, unspecified: Secondary | ICD-10-CM | POA: Diagnosis present

## 2023-06-09 HISTORY — DX: Unspecified convulsions: R56.9

## 2023-06-09 LAB — URINALYSIS, W/ REFLEX TO CULTURE (INFECTION SUSPECTED)
Bacteria, UA: NONE SEEN
Bilirubin Urine: NEGATIVE
Glucose, UA: NEGATIVE mg/dL
Hgb urine dipstick: NEGATIVE
Ketones, ur: NEGATIVE mg/dL
Leukocytes,Ua: NEGATIVE
Nitrite: NEGATIVE
Protein, ur: NEGATIVE mg/dL
Specific Gravity, Urine: 1.013 (ref 1.005–1.030)
pH: 7 (ref 5.0–8.0)

## 2023-06-09 LAB — CBC WITH DIFFERENTIAL/PLATELET
Abs Immature Granulocytes: 0 10*3/uL (ref 0.00–0.07)
Basophils Absolute: 0 10*3/uL (ref 0.0–0.1)
Basophils Relative: 0 %
Eosinophils Absolute: 0.2 10*3/uL (ref 0.0–0.5)
Eosinophils Relative: 3 %
HCT: 41 % (ref 39.0–52.0)
Hemoglobin: 13.6 g/dL (ref 13.0–17.0)
Immature Granulocytes: 0 %
Lymphocytes Relative: 21 %
Lymphs Abs: 1.1 10*3/uL (ref 0.7–4.0)
MCH: 29.8 pg (ref 26.0–34.0)
MCHC: 33.2 g/dL (ref 30.0–36.0)
MCV: 89.7 fL (ref 80.0–100.0)
Monocytes Absolute: 0.5 10*3/uL (ref 0.1–1.0)
Monocytes Relative: 9 %
Neutro Abs: 3.7 10*3/uL (ref 1.7–7.7)
Neutrophils Relative %: 67 %
Platelets: 141 10*3/uL — ABNORMAL LOW (ref 150–400)
RBC: 4.57 MIL/uL (ref 4.22–5.81)
RDW: 12.1 % (ref 11.5–15.5)
WBC: 5.5 10*3/uL (ref 4.0–10.5)
nRBC: 0 % (ref 0.0–0.2)

## 2023-06-09 LAB — COMPREHENSIVE METABOLIC PANEL WITH GFR
ALT: 17 U/L (ref 0–44)
AST: 22 U/L (ref 15–41)
Albumin: 4 g/dL (ref 3.5–5.0)
Alkaline Phosphatase: 55 U/L (ref 38–126)
Anion gap: 8 (ref 5–15)
BUN: 12 mg/dL (ref 8–23)
CO2: 30 mmol/L (ref 22–32)
Calcium: 9.1 mg/dL (ref 8.9–10.3)
Chloride: 101 mmol/L (ref 98–111)
Creatinine, Ser: 1.02 mg/dL (ref 0.61–1.24)
GFR, Estimated: >60 mL/min (ref >60)
Glucose, Bld: 100 mg/dL — ABNORMAL HIGH (ref 70–99)
Potassium: 3.7 mmol/L (ref 3.5–5.1)
Sodium: 139 mmol/L (ref 135–145)
Total Bilirubin: 1.1 mg/dL (ref 0.0–1.2)
Total Protein: 6.6 g/dL (ref 6.5–8.1)

## 2023-06-09 NOTE — Discharge Instructions (Addendum)
 We evaluated Jonathan Poole in the emergency department.  He said that he felt well and nothing was bothering him.  His laboratory testing was reassuring and we obtained a picture of his head which is negative.  We did not think that his rash was anything dangerous.  He should follow-up with his primary doctor.  Bring him back if his symptoms are worsening.

## 2023-06-09 NOTE — ED Provider Notes (Signed)
 [place] EMERGENCY DEPARTMENT AT [name] Provider Note  CSN: [id number] Arrival date & time: 06/09/23 2119  Chief Complaint(s) Altered Mental Status  HPI Jonathan Poole is a 68 y.o. male history of atrial fibrillation, dementia, seizure disorder presenting for apparent altered mental status.  The patient denies any complaints and is awake and alert.  He is not sure why he was sent here but he said he feels pretty good overall.  Per nursing there was some concern that he was more lethargic and had a rash to his torso. History limited due to dementia    Past Medical History Past Medical History:  Diagnosis Date   Atrial fibrillation (HCC)    Dementia (HCC)    Heart disease    Hyperchloremia    Seizures (HCC)    Patient Active Problem List   Diagnosis Date Noted   Memory loss 06/05/2023   Restlessness and agitation 06/05/2023   Encephalopathy 06/04/2023   Hyperbilirubinemia 06/04/2023   Thrombocytopenia (HCC) 06/04/2023   Hyperchloremia 05/29/2023   Recurrent falls 05/29/2023   Vitamin B12 deficiency (non anemic) 05/25/2023   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   Chronic pain syndrome 04/08/2023   Encounter for general adult medical examination with abnormal findings 02/05/2022   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/22/2021   Chronic prescription opiate use 03/21/2021   H/O cardiac pacemaker 04/12/2020   Chronic atrial fibrillation (HCC) 04/12/2020   Alzheimer's disease with early onset (HCC) 04/12/2020   DDD (degenerative disc disease), lumbar 04/12/2020   Hyperlipidemia 04/12/2020   Sinus node dysfunction (HCC) 01/02/2020   Elevated PSA 06/20/2019   Home Medication(s) Prior to Admission medications   Medication Sig Start Date End Date Taking? Authorizing Provider  acetaminophen  (TYLENOL ) 500 MG tablet Take 1,000 mg by mouth every 6 (six) hours as needed for moderate pain or headache.    [provider]  apixaban  (ELIQUIS ) 5 MG TABS  tablet Take 1 tablet (5 mg total) by mouth 2 (two) times daily. 05/18/23   Tammie Fall, MD  Buprenorphine  HCl-Naloxone  HCl 8-2 MG FILM Take 1 Film by mouth 3 (three) times daily. 02/21/23   [provider]  cyanocobalamin  (VITAMIN B12) 1000 MCG/ML injection Inject 1,000 mcg into the muscle every 30 (thirty) days. 03/28/23   [provider]  donepezil  (ARICEPT ) 10 MG tablet TAKE 1 TABLET AT BEDTIME Patient taking differently: Take 10 mg by mouth 2 (two) times daily. 06/16/22   Meldon Sport, MD  gabapentin  (NEURONTIN ) 800 MG tablet Take 800 mg by mouth 4 (four) times daily. 06/02/23   [provider]  levETIRAcetam  (KEPPRA ) 1000 MG tablet Take 1 tablet (1,000 mg total) by mouth 2 (two) times daily. 06/05/23   Wynetta Heckle, MD  memantine  (NAMENDA ) 10 MG tablet Take 10 mg by mouth 2 (two) times daily. 05/09/23   [provider]  naloxone  (NARCAN ) nasal spray 4 mg/0.1 mL SMARTSIG:Both Nares 01/28/22   [provider]  REXULTI  2 MG TABS tablet 1 tablet Orally Once a day for 30 days 12/18/22   [provider]  rosuvastatin  (CRESTOR ) 20 MG tablet TAKE 1 TABLET ONE TIME DAILY 02/26/22   Meldon Sport, MD  tiZANidine  (ZANAFLEX ) 4 MG tablet Take 1 tablet (4 mg total) by mouth 3 (three) times daily. 07/31/21   Zarwolo, Gloria, FNP  Past Surgical History Past Surgical History:  Procedure Laterality Date   PACEMAKER IMPLANT     PPM GENERATOR CHANGEOUT N/A 01/02/2020   Procedure: PPM GENERATOR CHANGEOUT;  Surgeon: Tammie Fall, MD;  Location: [facility] INVASIVE CV LAB;  Service: Cardiovascular;  Laterality: N/A;   Family History Family History  Problem Relation Age of Onset   Heart disease Father    Prostate cancer Paternal Grandfather     Social History Social History   Tobacco Use   Smoking status: Former    Current  packs/day: 0.00    Average packs/day: 1 pack/day for 15.0 years (15.0 ttl pk-yrs)    Types: Cigarettes    Start date: 31    Quit date: 2001    Years since quitting: 24.3    Passive exposure: Past   Smokeless tobacco: Never   Tobacco comments:    Verified by Wife, Amalia Badder [place]ack[place]    former smoker - quit 2015, on/off 40 years, 40 pack year history  Vaping Use   Vaping status: Former  Substance Use Topics   Alcohol use: Never   Drug use: Not Currently    Types: Marijuana   Allergies Penicillins  Review of Systems Review of Systems  All other systems reviewed and are negative.   Physical Exam Vital Signs  I have reviewed the triage vital signs BP 130/76   Pulse 71   Temp 98.1 F (36.7 C)   Resp 18   Ht 6' (1.829 m)   Wt 77 kg   SpO2 96%   BMI 23.02 kg/m  Physical Exam Vitals and nursing note reviewed.  Constitutional:      General: He is not in acute distress.    Appearance: Normal appearance.  HENT:     Mouth/Throat:     Mouth: Mucous membranes are moist.  Eyes:     Conjunctiva/sclera: Conjunctivae normal.  Cardiovascular:     Rate and Rhythm: Normal rate and regular rhythm.  Pulmonary:     Effort: Pulmonary effort is normal. No respiratory distress.     Breath sounds: Normal breath sounds.  Abdominal:     General: Abdomen is flat.     Palpations: Abdomen is soft.     Tenderness: There is no abdominal tenderness.  Musculoskeletal:     Right lower leg: No edema.     Left lower leg: No edema.  Skin:    General: Skin is warm and dry.     Capillary Refill: Capillary refill takes less than 2 seconds.     Comments: Questionable faint macular rash over the chest, no bulla, skin sloughing, vesicles,  Neurological:     Mental Status: He is alert. Mental status is at baseline.     Cranial Nerves: No cranial nerve deficit.     Motor: No weakness.     Comments: Oriented to self, knows he is in the hospital, does not know the year.  Does not really know why he is  in the hospital.  Psychiatric:        Mood and Affect: Mood normal.        Behavior: Behavior normal.     ED Results and Treatments Labs (all labs ordered are listed, but only abnormal results are displayed) Labs Reviewed  COMPREHENSIVE METABOLIC PANEL WITH GFR - Abnormal; Notable for the following components:      Result Value   Glucose, Bld 100 (*)    All other components within normal limits  CBC WITH DIFFERENTIAL/PLATELET - Abnormal; Notable for  the following components:   Platelets 141 (*)    All other components within normal limits  URINALYSIS, W/ REFLEX TO CULTURE (INFECTION SUSPECTED)                                                                                                                          Radiology No results found.  Pertinent labs & imaging results that were available during my care of the patient were reviewed by me and considered in my medical decision making (see MDM for details).  Medications Ordered in ED Medications - No data to display                                                                                                                                   Procedures Procedures  (including critical care time)  Medical Decision Making / ED Course   MDM:  68 year old male presenting to the emergency department with apparent altered mental status.  On my evaluation of the patient, he is awake and alert.  He has no complaints currently.  His vital signs are reassuring.  His examination is overall reassuring.  He has a questionable faint macular rash on his chest versus chronic skin changes.  Attempted to call patient's wife to get additional history however no answer was obtained.  He has no external signs of trauma or fall.  Patient apparently sent here for lethargy however he is not lethargic.  Unclear what his baseline is but he does have dementia and is able to converse normally here in the emergency department.  Will check basic labs,  urinalysis.  No external signs of trauma but given reported confusion and patient being on a blood thinner will check CT head.  If this is all reassuring, we will patient would be safe for discharge to home.  There is also report of some rash after starting Keppra .  He has a faint macular rash versus just chronic skin changes.  Does not appear consistent with any dangerous rash as drug eruption, SJS/TN, DRESS, or other rash.  Clinical Course as of 06/11/23 1245  Tue Jun 09, 2023  1130 Workup was reassuring, patient continues to deny any complaints. Attempted to call family but no answer. Given his overall well appearance and reassuring workup, lack of complaints or concerning report per EMS I do not think patient needs to be hospitalized at this time. Will discharge patient  to home.   [WS]    Clinical Course User Index [WS] Mordecai Applebaum, MD     Additional history obtained: -Additional history obtained from ems -External records from outside source obtained and reviewed including: Chart review including previous notes, labs, imaging, consultation notes including prior notes    Lab Tests: -I ordered, reviewed, and interpreted labs.   The pertinent results include:   Labs Reviewed  COMPREHENSIVE METABOLIC PANEL WITH GFR - Abnormal; Notable for the following components:      Result Value   Glucose, Bld 100 (*)    All other components within normal limits  CBC WITH DIFFERENTIAL/PLATELET - Abnormal; Notable for the following components:   Platelets 141 (*)    All other components within normal limits  URINALYSIS, W/ REFLEX TO CULTURE (INFECTION SUSPECTED)    Notable for mild thrombocytopenia, nonspecific      Imaging Studies ordered: I ordered imaging studies including CT head On my interpretation imaging demonstrates negative  I independently visualized and interpreted imaging. I agree with the radiologist interpretation   Medicines ordered and prescription drug  management: No orders of the defined types were placed in this encounter.   -I have reviewed the patients home medicines and have made adjustments as needed  Co morbidities that complicate the patient evaluation  Past Medical History:  Diagnosis Date   Atrial fibrillation (HCC)    Dementia (HCC)    Heart disease    Hyperchloremia    Seizures (HCC)       Dispostion: Disposition decision including need for hospitalization was considered, and patient discharged from emergency department.    Final Clinical Impression(s) / ED Diagnoses Final diagnoses:  Weakness     This chart was dictated using voice recognition software.  Despite best efforts to proofread,  errors can occur which can change the documentation meaning.    Mordecai Applebaum, MD 06/11/23 1245

## 2023-06-09 NOTE — ED Triage Notes (Signed)
 Pt sent here for being more altered and rash to torso since starting Keppra . Pt has dementia. Per family he has been more lethargic.

## 2023-06-09 NOTE — ED Notes (Signed)
 Patient transported to CT

## 2023-06-10 ENCOUNTER — Emergency Department (HOSPITAL_COMMUNITY)
Admission: EM | Admit: 2023-06-10 | Discharge: 2023-06-10 | Disposition: A | Discharge status: Home / Self Care | Source: Home / Self Care

## 2023-06-10 DIAGNOSIS — I10 Essential (primary) hypertension: Secondary | ICD-10-CM | POA: Diagnosis not present

## 2023-06-10 DIAGNOSIS — F4489 Other dissociative and conversion disorders: Secondary | ICD-10-CM | POA: Diagnosis not present

## 2023-06-10 DIAGNOSIS — Z743 Need for continuous supervision: Secondary | ICD-10-CM | POA: Diagnosis not present

## 2023-06-10 NOTE — ED Notes (Signed)
 Family made aware of transport bringing pt back home.

## 2023-06-11 DIAGNOSIS — M51369 Other intervertebral disc degeneration, lumbar region without mention of lumbar back pain or lower extremity pain: Secondary | ICD-10-CM | POA: Diagnosis not present

## 2023-06-11 DIAGNOSIS — E785 Hyperlipidemia, unspecified: Secondary | ICD-10-CM | POA: Diagnosis not present

## 2023-06-11 DIAGNOSIS — I495 Sick sinus syndrome: Secondary | ICD-10-CM | POA: Diagnosis not present

## 2023-06-11 DIAGNOSIS — G894 Chronic pain syndrome: Secondary | ICD-10-CM | POA: Diagnosis not present

## 2023-06-11 DIAGNOSIS — I480 Paroxysmal atrial fibrillation: Secondary | ICD-10-CM | POA: Diagnosis not present

## 2023-06-11 DIAGNOSIS — F331 Major depressive disorder, recurrent, moderate: Secondary | ICD-10-CM | POA: Diagnosis not present

## 2023-06-11 DIAGNOSIS — R569 Unspecified convulsions: Secondary | ICD-10-CM | POA: Diagnosis not present

## 2023-06-11 DIAGNOSIS — F0283 Dementia in other diseases classified elsewhere, unspecified severity, with mood disturbance: Secondary | ICD-10-CM | POA: Diagnosis not present

## 2023-06-11 DIAGNOSIS — G309 Alzheimer's disease, unspecified: Secondary | ICD-10-CM | POA: Diagnosis not present

## 2023-06-12 ENCOUNTER — Other Ambulatory Visit: Payer: Self-pay | Admitting: *Deleted

## 2023-06-12 ENCOUNTER — Encounter: Payer: Self-pay | Admitting: *Deleted

## 2023-06-12 ENCOUNTER — Telehealth: Payer: Self-pay | Admitting: *Deleted

## 2023-06-12 NOTE — Patient Instructions (Signed)
 Visit Information  Thank you for taking time to visit with me today. Please don't hesitate to contact me if I can be of assistance to you before our next scheduled appointment.  Your next care management appointment is by telephone on 07/17/23 at 3:30 pm  Please follow up with the local DSS & APS staff. Outreach to [facility] to get specific plan coverage information for respite care, palliative care, facility placement, speak with Home health RN & SW about FL2, Make calls to attempt to get earlier visits with pcp & Neurology    Please call the care guide team at [phone] if you need to cancel, schedule, or reschedule an appointment.   Please call the Suicide and Crisis Lifeline: 988 call the [place]  National Suicide Prevention Lifeline: (331) 616-8134 or TTY: 707-249-9360 TTY 731-467-5196) to talk to a trained counselor call 1-800-273-TALK (toll free, 24 hour hotline) call the [facility]: 2295304328 call 911 if you are experiencing a Mental Health or Behavioral Health Crisis or need someone to talk to.  Mirian Casco L. Mcarthur Speedy, RN, BSN, CCM [place]  [facility], [facility] RN Care Manager Direct Dial: [phone]  Fax: [phone]

## 2023-06-12 NOTE — Patient Outreach (Signed)
 Complex Care Management   Visit Note  06/12/2023  Name:  Jonathan Poole MRN: [id number] DOB: 1955-06-21  Situation: Referral received for Complex Care Management related to Dementia, Atrial Fibrillation, and mixed hyperlipidemia with History of cardiac pacemaker  I obtained verbal consent from Caregiver.  Visit completed with Jonathan Poole  on the phone  Wife reviewed post admission, return to ED, APS & Home health visit information with RN CM Wife reports she is aware patient's [employer] coverage is for only short term rehab. She inquired about respite services- Referred to Home health Social worker & [employer] for specific plan coverage  Background:   Past Medical History:  Diagnosis Date   Atrial fibrillation (HCC)    Dementia (HCC)    Heart disease    Hyperchloremia    Seizures (HCC)     Assessment: Patient Reported Symptoms:  Cognitive Cognitive Status: Able to follow simple commands, Other:, Requires Assistance Decision Making, Struggling with memory recall, Poor judgment in daily scenarios, Confused or disoriented, Difficulties with attention and concentration Cognitive/Intellectual Conditions Management [RPT]: None reported or documented in medical history or problem list Other: can not be left unattended   Health Maintenance Behaviors: Annual physical exam, Sleep adequate Healing Pattern: Average Health Facilitated by: Rest, Pain control  Neurological Neurological Review of Symptoms: Hearing changes, Vision changes, Weakness Neurological Conditions: Seizures, Dementia, Alzheimer's disease (Jonathan states he clenches fist in front of him like he is gripping something, goes pale white lasts about 1-2 minutes.  Wife reports weakness of both sides of his face. Holds medicines in hand and forgets where his mouth is to place the medicine.) Neurological Management Strategies: Activity, Adequate rest, Medication therapy, Routine screening Neurological Self-Management Outcome: 3  (uncertain)  HEENT HEENT Symptoms Reported: Change or loss of hearing HEENT Conditions: Ear problem(s), Vision problem(s) HEENT Management Strategies: Adequate rest, Routine screening, Medical device HEENT Self-Management Outcome:  (having more difficulty holding his head up.) HEENT Comment: wife reports when he sits his chin is on his chest and he looks up through, can not sit straight Ear problem(s), Vision problem(s)  Cardiovascular Cardiovascular Symptoms Reported: Irregular pulse Cardiovascular Conditions: Dysrhythmia, High blood cholesterol, Cardiomyopathy Cardiovascular Management Strategies: Adequate rest, Medical device, Routine screening (has a pacemaker) Weight: 167 lb (75.8 kg) Cardiovascular Self-Management Outcome: 4 (good)  Respiratory Respiratory Symptoms Reported: No symptoms reported Respiratory Self-Management Outcome: 4 (good)  Endocrine Patient reports the following symptoms related to hypoglycemia or hyperglycemia : Shakiness, Weakness or fatigue Is patient diabetic?: No Endocrine Management Strategies: Activity, Adequate rest, Routine screening Endocrine Self-Management Outcome: 4 (good)  Gastrointestinal Gastrointestinal Symptoms Reported: Incontinence Gastrointestinal Management Strategies: Adequate rest, Incontinence garment/pad, Activity Gastrointestinal Self-Management Outcome: 4 (good)    Genitourinary Genitourinary Symptoms Reported: Incontinence Additional Genitourinary Details: wife states he now needs incontinence garments, noted to void and not change pul up often    Integumentary Integumentary Symptoms Reported: Skin changes Additional Integumentary Details: Rash now gone after stopped seizure medicine Rash was down his back and on chest Wife reported no perineal/buttocks rash Skin Conditions: Other  Musculoskeletal Musculoskelatal Symptoms Reviewed: Weakness   Falls in the past year?: Yes Number of falls in past year: 2 or more Was there an injury  with Fall?: Yes Fall Risk Category Calculator: 3 Patient Fall Risk Level: High Fall Risk Patient at Risk for Falls Due to: History of fall(s), Mental status change Fall risk Follow up: Falls evaluation completed  Psychosocial   Behavioral Health Conditions: Alzheimer's disease, Dementia   Quality of Family Relationships:  helpful (wife reports the hospital called and scheduled a home visit from APS. Wife states APS staff x 2 visited Amalia Badder states the APS staff encouraged them to go to 06/12/23 pcp visit to get assist with faciity placement. Amalia Badder confirmed this 06/12/23 appt was missed)      05/21/2023    7:58 PM  Depression screen PHQ 2/9  Trouble concentrating 1  Moving slowly or fidgety/restless 1  Suicidal thoughts 0  Difficult doing work/chores Somewhat difficult    There were no vitals filed for this visit.  Medications Reviewed Today   Medications were not reviewed in this encounter     Recommendation:   PCP Follow-up  Follow Up Plan:   Telephone follow up appointment date/time:  07/17/23 3:30 pm  Jullie Oiler L. Mcarthur Speedy, RN, BSN, CCM [place]  [facility], [facility] RN Care Manager Direct Dial: [phone]  Fax: [phone]

## 2023-06-12 NOTE — Patient Outreach (Signed)
 Collaboration with [facility] health [phone]. Spoke with Shelvy Dickens related patient services. Confirmed services ordered to include HH SW, Confirmed active with [facility] since 06/08/23 with a HH SW visit on 06/09/23. Shelvy Dickens to have [facility] SW outreach to [employer].  Noted patient with 1 further ED visit on 06/09/23 (for altered mental status & rash on torso since started Keppra ) since hospital discharge on 06/06/23 with chart review   Shawan Corella L. Mcarthur Speedy, RN, BSN, CCM [place]  [facility], [facility] Health RN Care Manager Direct Dial: [phone]  Fax: [phone]

## 2023-06-15 DIAGNOSIS — F331 Major depressive disorder, recurrent, moderate: Secondary | ICD-10-CM | POA: Diagnosis not present

## 2023-06-15 DIAGNOSIS — E785 Hyperlipidemia, unspecified: Secondary | ICD-10-CM | POA: Diagnosis not present

## 2023-06-15 DIAGNOSIS — R569 Unspecified convulsions: Secondary | ICD-10-CM | POA: Diagnosis not present

## 2023-06-15 DIAGNOSIS — M51369 Other intervertebral disc degeneration, lumbar region without mention of lumbar back pain or lower extremity pain: Secondary | ICD-10-CM | POA: Diagnosis not present

## 2023-06-15 DIAGNOSIS — G894 Chronic pain syndrome: Secondary | ICD-10-CM | POA: Diagnosis not present

## 2023-06-15 DIAGNOSIS — I495 Sick sinus syndrome: Secondary | ICD-10-CM | POA: Diagnosis not present

## 2023-06-15 DIAGNOSIS — G309 Alzheimer's disease, unspecified: Secondary | ICD-10-CM | POA: Diagnosis not present

## 2023-06-15 DIAGNOSIS — I480 Paroxysmal atrial fibrillation: Secondary | ICD-10-CM | POA: Diagnosis not present

## 2023-06-15 DIAGNOSIS — F0283 Dementia in other diseases classified elsewhere, unspecified severity, with mood disturbance: Secondary | ICD-10-CM | POA: Diagnosis not present

## 2023-06-18 ENCOUNTER — Other Ambulatory Visit: Payer: Self-pay

## 2023-06-18 ENCOUNTER — Telehealth: Payer: Self-pay | Admitting: *Deleted

## 2023-06-18 ENCOUNTER — Emergency Department (HOSPITAL_COMMUNITY)
Admission: EM | Admit: 2023-06-18 | Discharge: 2023-06-22 | Disposition: A | Discharge status: Home / Self Care | Attending: Emergency Medicine | Admitting: Emergency Medicine

## 2023-06-18 ENCOUNTER — Encounter (HOSPITAL_COMMUNITY): Payer: Self-pay

## 2023-06-18 DIAGNOSIS — F03918 Unspecified dementia, unspecified severity, with other behavioral disturbance: Secondary | ICD-10-CM | POA: Insufficient documentation

## 2023-06-18 DIAGNOSIS — Z7901 Long term (current) use of anticoagulants: Secondary | ICD-10-CM | POA: Insufficient documentation

## 2023-06-18 DIAGNOSIS — I4891 Unspecified atrial fibrillation: Secondary | ICD-10-CM | POA: Insufficient documentation

## 2023-06-18 DIAGNOSIS — Z79899 Other long term (current) drug therapy: Secondary | ICD-10-CM | POA: Insufficient documentation

## 2023-06-18 DIAGNOSIS — I2510 Atherosclerotic heart disease of native coronary artery without angina pectoris: Secondary | ICD-10-CM | POA: Insufficient documentation

## 2023-06-18 DIAGNOSIS — E785 Hyperlipidemia, unspecified: Secondary | ICD-10-CM | POA: Insufficient documentation

## 2023-06-18 DIAGNOSIS — T887XXA Unspecified adverse effect of drug or medicament, initial encounter: Secondary | ICD-10-CM | POA: Diagnosis not present

## 2023-06-18 LAB — CBC WITH DIFFERENTIAL/PLATELET
Abs Immature Granulocytes: 0.02 10*3/uL (ref 0.00–0.07)
Basophils Absolute: 0 10*3/uL (ref 0.0–0.1)
Basophils Relative: 0 %
Eosinophils Absolute: 0.1 10*3/uL (ref 0.0–0.5)
Eosinophils Relative: 2 %
HCT: 39.9 % (ref 39.0–52.0)
Hemoglobin: 13 g/dL (ref 13.0–17.0)
Immature Granulocytes: 0 %
Lymphocytes Relative: 16 %
Lymphs Abs: 1.1 10*3/uL (ref 0.7–4.0)
MCH: 29.1 pg (ref 26.0–34.0)
MCHC: 32.6 g/dL (ref 30.0–36.0)
MCV: 89.3 fL (ref 80.0–100.0)
Monocytes Absolute: 0.4 10*3/uL (ref 0.1–1.0)
Monocytes Relative: 6 %
Neutro Abs: 5.4 10*3/uL (ref 1.7–7.7)
Neutrophils Relative %: 76 %
Platelets: 192 10*3/uL (ref 150–400)
RBC: 4.47 MIL/uL (ref 4.22–5.81)
RDW: 12 % (ref 11.5–15.5)
WBC: 7.2 10*3/uL (ref 4.0–10.5)
nRBC: 0 % (ref 0.0–0.2)

## 2023-06-18 LAB — RAPID URINE DRUG SCREEN, HOSP PERFORMED
Amphetamines: NOT DETECTED
Barbiturates: NOT DETECTED
Benzodiazepines: NOT DETECTED
Cocaine: NOT DETECTED
Opiates: NOT DETECTED
Tetrahydrocannabinol: NOT DETECTED

## 2023-06-18 LAB — COMPREHENSIVE METABOLIC PANEL WITH GFR
ALT: 45 U/L — ABNORMAL HIGH (ref 0–44)
AST: 40 U/L (ref 15–41)
Albumin: 3.9 g/dL (ref 3.5–5.0)
Alkaline Phosphatase: 50 U/L (ref 38–126)
Anion gap: 10 (ref 5–15)
BUN: 10 mg/dL (ref 8–23)
CO2: 26 mmol/L (ref 22–32)
Calcium: 8.9 mg/dL (ref 8.9–10.3)
Chloride: 104 mmol/L (ref 98–111)
Creatinine, Ser: 1.14 mg/dL (ref 0.61–1.24)
GFR, Estimated: >60 mL/min (ref >60)
Glucose, Bld: 114 mg/dL — ABNORMAL HIGH (ref 70–99)
Potassium: 3.5 mmol/L (ref 3.5–5.1)
Sodium: 140 mmol/L (ref 135–145)
Total Bilirubin: 0.6 mg/dL (ref 0.0–1.2)
Total Protein: 6.5 g/dL (ref 6.5–8.1)

## 2023-06-18 LAB — SALICYLATE LEVEL: Salicylate Lvl: <7 mg/dL — ABNORMAL LOW (ref 7.0–30.0)

## 2023-06-18 LAB — ETHANOL: Alcohol, Ethyl (B): <15 mg/dL (ref <15)

## 2023-06-18 LAB — ACETAMINOPHEN LEVEL: Acetaminophen (Tylenol), Serum: <10 ug/mL — ABNORMAL LOW (ref 10–30)

## 2023-06-18 MED ORDER — DONEPEZIL HCL 5 MG PO TABS: 10.0000 mg | ORAL_TABLET | Freq: Every day | ORAL | Status: DC

## 2023-06-18 MED ORDER — APIXABAN 5 MG PO TABS
5.0000 mg | ORAL_TABLET | Freq: Two times a day (BID) | ORAL | Status: DC
  Administered 2023-06-18 – 2023-06-22 (×8): 5 mg via ORAL
  Filled 2023-06-18 (×8): qty 1

## 2023-06-18 MED ORDER — BUPRENORPHINE HCL-NALOXONE HCL 8-2 MG SL SUBL
1.0000 | SUBLINGUAL_TABLET | Freq: Every day | SUBLINGUAL | Status: DC
  Administered 2023-06-19 – 2023-06-22 (×4): 1 via SUBLINGUAL
  Filled 2023-06-18 (×4): qty 1

## 2023-06-18 MED ORDER — BREXPIPRAZOLE 1 MG PO TABS
3.0000 mg | ORAL_TABLET | Freq: Every day | ORAL | Status: DC
  Administered 2023-06-19 – 2023-06-21 (×3): 3 mg via ORAL
  Filled 2023-06-18 (×5): qty 3

## 2023-06-18 MED ORDER — TIZANIDINE HCL 4 MG PO TABS
4.0000 mg | ORAL_TABLET | Freq: Three times a day (TID) | ORAL | Status: DC
  Administered 2023-06-18 – 2023-06-22 (×11): 4 mg via ORAL
  Filled 2023-06-18 (×11): qty 1

## 2023-06-18 MED ORDER — MEMANTINE HCL 10 MG PO TABS
10.0000 mg | ORAL_TABLET | Freq: Every day | ORAL | Status: DC
Start: 2023-06-19 — End: 2023-06-22
  Administered 2023-06-19 – 2023-06-22 (×4): 10 mg via ORAL
  Filled 2023-06-18 (×4): qty 1

## 2023-06-18 MED ORDER — LEVETIRACETAM 500 MG PO TABS
1000.0000 mg | ORAL_TABLET | Freq: Two times a day (BID) | ORAL | Status: DC
  Administered 2023-06-18 – 2023-06-22 (×8): 1000 mg via ORAL
  Filled 2023-06-18 (×8): qty 2

## 2023-06-18 MED ORDER — DONEPEZIL HCL 5 MG PO TABS
10.0000 mg | ORAL_TABLET | Freq: Every day | ORAL | Status: DC
  Administered 2023-06-19 – 2023-06-22 (×4): 10 mg via ORAL
  Filled 2023-06-18 (×4): qty 2

## 2023-06-18 MED ORDER — GABAPENTIN 400 MG PO CAPS
800.0000 mg | ORAL_CAPSULE | Freq: Three times a day (TID) | ORAL | Status: DC
  Administered 2023-06-18 – 2023-06-22 (×11): 800 mg via ORAL
  Filled 2023-06-18 (×11): qty 2

## 2023-06-18 NOTE — ED Notes (Signed)
 Patient has been found my security.
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
## 2023-06-18 NOTE — ED Notes (Signed)
 Please update wife Jonathan Poole) NEW number [phone]
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
## 2023-06-18 NOTE — ED Notes (Signed)
 Pts family came to desk advising she had went to the restroom and came back out and pt is now missing. I have alerted triage staff, Security and charge nurse. [employer] notified by security.  Family stated pt has dementia.

## 2023-06-19 DIAGNOSIS — F03918 Unspecified dementia, unspecified severity, with other behavioral disturbance: Secondary | ICD-10-CM | POA: Insufficient documentation

## 2023-06-19 DIAGNOSIS — R9431 Abnormal electrocardiogram [ECG] [EKG]: Secondary | ICD-10-CM | POA: Diagnosis not present
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
## 2023-06-19 NOTE — ED Notes (Signed)
 TTS contacted to see when they will be able to evaluate pt. State they will see them once completed with the consult they are working on.

## 2023-06-19 NOTE — ED Notes (Signed)
 MD notified that TTS consult has yet to be completed.

## 2023-06-19 NOTE — Discharge Instructions (Signed)
 Discharge recommendations:   Medications: Patient is to take medications as prescribed. The patient or patient's guardian is to contact a medical professional and/or outpatient provider to address any new side effects that develop. The patient or the patient's guardian should update outpatient providers of any new medications and/or medication changes.    Outpatient Follow [id number] can benefit from follow up with Neurology to address dementia and behavioral disturbances. Studies showed that aggressive behaviors are linked to dementia and can be challenging for the patient when feeling frustrated, or feeling as if they are not being listened to are understood. Frustration and confusion can lead to agitation, restlessness, walking about, and aggressive behaviors. It is important to create a calm and safe environment for the individual, implement daily activities and provide support and cognitive stimulation.    Atypical antipsychotics: If you are prescribed an atypical antipsychotic, it is recommended that your height, weight, BMI, blood pressure, fasting lipid panel, and fasting blood sugar be monitored by your outpatient providers.  Safety:   The following safety precautions should be taken:   No sharp objects. This includes scissors, razors, scrapers, and putty knives.   Chemicals should be removed and locked up.   Medications should be removed and locked up.   Weapons should be removed and locked up. This includes firearms, knives and instruments that can be used to cause injury.   The patient should abstain from use of illicit substances/drugs and abuse of any medications.  If symptoms worsen or do not continue to improve or if the patient becomes actively suicidal or homicidal then it is recommended that the patient return to the closest hospital emergency department, the [facility], or call 911 for further evaluation and treatment. National Suicide  Prevention Lifeline 1-800-SUICIDE or (778)826-4227.  About 988 988 offers 24/7 access to trained crisis counselors who can help people experiencing mental health-related distress. People can call or text 988 or chat 988lifeline.org for themselves or if they are worried about a loved one who may need crisis support.

## 2023-06-19 NOTE — ED Provider Notes (Signed)
 Emergency Medicine Observation Re-evaluation Note  Jonathan Poole is a 68 y.o. male, seen on rounds today.  Pt initially presented to the ED for complaints of Violent Outburst, Psych Eval, and Dementia Currently, the patient is resting.  Physical Exam  BP 112/72   Pulse 69   Temp 98.3 F (36.8 C)   Resp 19   SpO2 100%  Physical Exam General: NAD   ED Course / MDM  EKG:   I have reviewed the labs performed to date as well as medications administered while in observation.  Recent changes in the last 24 hours include no acute events reported.  Plan  Current plan is for TTS/BH/SW evaluation.    Burnette Carte, MD 06/19/23 [phone]

## 2023-06-19 NOTE — ED Notes (Signed)
 Tele-cart set up at bedside.

## 2023-06-20 DIAGNOSIS — F03918 Unspecified dementia, unspecified severity, with other behavioral disturbance: Secondary | ICD-10-CM | POA: Diagnosis not present

## 2023-06-20 NOTE — ED Provider Notes (Addendum)
 Emergency Medicine Observation Re-evaluation Note  Jonathan Poole is a 68 y.o. male, seen on rounds today.  Pt initially presented to the ED for complaints of Violent Outburst, Psych Eval, and Dementia Currently, the patient is sleeping.  Physical Exam  BP 115/77 (BP Location: Left Arm)   Pulse (!) 50   Temp 98.2 F (36.8 C) (Oral)   Resp 16   SpO2 100%  Physical Exam General: No acute distress, sleeping Cardiac: Well-perfused Lungs: No respiratory distress Psych: Calm, sleeping  ED Course / MDM  EKG:EKG Interpretation Date/Time:  Friday Jun 19 2023 00:06:46 EDT Ventricular Rate:  70 PR Interval:  236 QRS Duration:  84 QT Interval:  458 QTC Calculation: 494 R Axis:   90  Text Interpretation: Atrial-paced rhythm with prolonged AV conduction Rightward axis Prolonged QT Abnormal ECG When compared with ECG of 19-Mar-2023 22:21, No significant change was found Confirmed by Alissa April ([id number]) on 06/20/2023 1:34:42 AM  I have reviewed the labs performed to date as well as medications administered while in observation.  Recent changes in the last 24 hours include none.  Plan  Current plan is for psychiatric evaluation to determine ultimate disposition.  The patient was evaluated by psychiatry yesterday and they recommended discharge with neurology follow-up.  The patient's family is not comfortable taking him home given his agitation.  Will reconsult psychiatry for medication recommendations.  Will likely require further ED observation while they are titrating his medications.  We have reached out to palliative care and hospice per the patient's family's request.  They may be able to set something up early next week.  Ideally medications can be adjusted and the patient can go home and await this consultation at home as an outpatient.    Carin Charleston, MD 06/20/23 1130    Carin Charleston, MD 06/20/23 [phone]

## 2023-06-20 NOTE — TOC Transition Note (Addendum)
 Transition of Care [facility]) - Discharge Note   Patient Details  Name: Jonathan Poole MRN: [id number] Date of Birth: [birth year]  Transition of Care [facility]) CM/SW Contact:  Ronni Colace, RN Phone Number: 06/20/2023, 12:32 PM   Clinical Narrative:    Patient here in the ED calm and cooperative. Discussed with TOC supervisor. The patient makes too much for medicaid, cannot be placed currently unless it is out of pocket for memory care Discussed with team. Gasper Karst is set up with maximum services. Called the patients wife Mrs. Trainer. .[phone]. Left  message to call this RNCM back.to discuss disposition 1300 called home number, not connected Called Son,  he got his mother on the phone ( patients wife). Recounting the incident, and does not feel safe with him in the house. She states  that he is angry at home but better with others. He was put on medication in October or November for anger.  They did not at that time investigate memory care. Discussed chronic disease process.  She stated that [facility] told them they could not help with placement, and she received a notice saying they  are not coming out anymore that his services ended. that  the hospital would be the one to do placement. But we cannot place for memory care from here. Discussed palliative care referral.  She voiced wanting to get a neurology and palliative consult. She stated they gave him "6 months to live"  I told Mrs. Matheney I would check with the physician on the palliative and neurology consult and with [facility] about services. Discussed "elderproofing" the house as much as possible. 1415 Spoke with Peterson Brandt from [facility]. She states they could see him for hospice or palliative whatever is decided. . Early next week. With palliative they could have medication management but still get home health, with hospice, they provide a aide for bathing and RN for medication sometimes once or twice a week. The bulk of the care would be  with the family.  Called Cory from [place] who states he is active  for [employer] and social work. Messaged MD and RN with what was discussed with palliative and home health 1530 Spoke to Dr Charlee Conine, he is going to re-order psych consult for medication management, . Called Mrs. Rouillard and left a message to return call to discuss. He will be here overnight until psychiatric services re-evaluates.  Final next level of care: Home w Home Health Services Barriers to Discharge: Continued Medical Work up   Patient Goals and CMS Choice Patient states their goals for this hospitalization and ongoing recovery are:: Nursing home/memory care          Discharge Placement                       Discharge Plan and Services Additional resources added to the After Visit Summary for                                       Social Drivers of Health (SDOH) Interventions SDOH Screenings   Food Insecurity: No Food Insecurity (06/04/2023)  Housing: Low Risk  (06/04/2023)  Transportation Needs: No Transportation Needs (06/04/2023)  Utilities: Not At Risk (06/04/2023)  Alcohol Screen: Low Risk  (04/10/2023)  Depression (PHQ2-9): Low Risk  (05/21/2023)  Financial Resource Strain: Low Risk  (05/21/2023)  Physical Activity: Inactive (04/10/2023)  Social Connections: Unknown (06/04/2023)  Stress: No Stress Concern Present (04/10/2023)  Tobacco Use: Medium Risk (06/18/2023)  Health Literacy: Inadequate Health Literacy (04/10/2023)     Readmission Risk Interventions     No data to display

## 2023-06-20 NOTE — ED Notes (Signed)
 Patient requires frequent redirection and orientation. Pleasant when interacting with staff members.

## 2023-06-21 DIAGNOSIS — F03918 Unspecified dementia, unspecified severity, with other behavioral disturbance: Secondary | ICD-10-CM | POA: Diagnosis not present

## 2023-06-21 MED ORDER — TRAZODONE HCL 100 MG PO TABS
100.0000 mg | ORAL_TABLET | Freq: Once | ORAL | Status: AC
  Administered 2023-06-21: 100 mg via ORAL
  Filled 2023-06-21: qty 1

## 2023-06-21 MED ORDER — TRAZODONE HCL 100 MG PO TABS
100.0000 mg | ORAL_TABLET | Freq: Every day | ORAL | Status: DC
  Administered 2023-06-21: 100 mg via ORAL
  Filled 2023-06-21: qty 1

## 2023-06-21 NOTE — ED Notes (Signed)
 Patient became agitated after requiring multiple times to be redirection. Security was called. Patient able to be de escalated verbally.

## 2023-06-21 NOTE — ED Notes (Signed)
 Patient provided with newspaper to read.  Offered snack and bathroom.  Patient requesting additional water at this time.

## 2023-06-21 NOTE — Care Management (Addendum)
 Patients wife ms Garris called to check on the patient. Awaiting  TTS. Told her he would likely be here through the night.  She has voiced being scared to bring him home,  discussed hospice versus palliative. She states he seems to have more coverage with hospice services. Had talked to [employer]. Messaged her again today she will reach out to Mrs Giza today to discuss. Messaged with provider and nursing regarding latest plans.

## 2023-06-21 NOTE — ED Notes (Signed)
Patient ambulatory to restroom. Steady gait noted.

## 2023-06-21 NOTE — Progress Notes (Signed)
 Jonathan Poole ED 050 [facility] Liaison Note   Received request from [place], Transitions of Care Manager, for hospice services at home after discharge. Attempted to contact patient wife Amalia Badder to initiate education related to hospice philosophy, services, and team approach to care. Unable to reach. Message left. Will wait for return call or Liaison team will follow up with spouse on tomorrow.   Thank you for the opportunity to participate in this patient's care.  Jacqlyn Matas, BSN, RN Hospice Nurse Liaison [phone]

## 2023-06-21 NOTE — ED Provider Notes (Signed)
 Emergency Medicine Observation Re-evaluation Note  Jonathan Poole is a 68 y.o. male, seen on rounds today.  Pt initially presented to the ED for complaints of Violent Outburst, Psych Eval, and Dementia Currently, the patient is sleeping.  Physical Exam  BP 111/71 (BP Location: Right Arm)   Pulse 71   Temp 98.6 F (37 C) (Oral)   Resp 18   SpO2 98%  Physical Exam General: No acute distress, sleeping Cardiac: Well-perfused Lungs: No respiratory distress Psych: Sleeping  ED Course / MDM  EKG:EKG Interpretation Date/Time:  Friday Jun 19 2023 00:06:46 EDT Ventricular Rate:  70 PR Interval:  236 QRS Duration:  84 QT Interval:  458 QTC Calculation: 494 R Axis:   90  Text Interpretation: Atrial-paced rhythm with prolonged AV conduction Rightward axis Prolonged QT Abnormal ECG When compared with ECG of 19-Mar-2023 22:21, No significant change was found Confirmed by Alissa April ([id number]) on 06/20/2023 1:34:42 AM  I have reviewed the labs performed to date as well as medications administered while in observation.  Recent changes in the last 24 hours include the patient did have some issues with sundowning last night but was verbally de-escalated.  Plan  Current plan is for psychiatric reevaluation for medication recommendations and continued coordination with family and social work for safe disposition plan.    Carin Charleston, MD 06/21/23 1131

## 2023-06-21 NOTE — ED Notes (Signed)
 Patient currently reading newspaper.  No needs voiced at this time.

## 2023-06-22 DIAGNOSIS — F03918 Unspecified dementia, unspecified severity, with other behavioral disturbance: Secondary | ICD-10-CM | POA: Diagnosis not present

## 2023-06-22 MED ORDER — HALOPERIDOL 5 MG PO TABS
5.0000 mg | ORAL_TABLET | Freq: Three times a day (TID) | ORAL | Status: DC | PRN
Start: 2023-06-22 — End: 2023-06-22

## 2023-06-22 MED ORDER — HALOPERIDOL LACTATE 5 MG/ML IJ SOLN: 5.0000 mg | Freq: Three times a day (TID) | INTRAMUSCULAR | Status: DC | PRN

## 2023-06-22 NOTE — Progress Notes (Signed)
 Barriers to Discharge:  ED Facility/Family Refusing to Allow Patient to Return  Pt wife reports she does not feel safe with pt returning home.  Pt awaiting home hospice/palliative consult for evaluation and appropriateness of services.  Viliami Bracco J. Rachel Budds, RN, BSN, NCM  Transitions of Care  Nurse Case Manager  [facility] Emergency Departments  Operative Services  [phone]

## 2023-06-22 NOTE — Progress Notes (Addendum)
 1pm: CSW received call from patient's wife stating she is outside and wishes for patient to be brought outside to her.   CSW informed RN of request and she was agreeable.  12:55pm: CSW attempted to reach patient's wife Jonathan Poole to determine her estimated time of arrival - no answer.  9:09am: CSW made additional attempt to reach patient's wife without success.  CSW spoke with patient's son Jonathan Poole to discuss discharge plan. Jonathan Poole added Jonathan Poole to the call for discussion. CSW explained medication changes made by NP and that Haldol was ordered for PRN use. Jonathan Poole agreeable to come and get patient around lunch today. Jonathan Poole requested all new medications be sent to the pharmacy for pick up. No further TOC needs at this time.  CSW notified MD and RN of plan.  CSW notified [name] at [address] of patient's discharge plan.  8:30am: CSW attempted to reach patient's wife Jonathan Poole without success - a voicemail was left requesting a return call.  CSW attempted to reach patient's son Jonathan Poole without success - a voicemail was left requesting a return call.  [name], MSW, LCSW Transitions of Care  Clinical Social Worker II [phone]

## 2023-06-22 NOTE — ED Provider Notes (Addendum)
 Emergency Medicine Observation Re-evaluation Note  Jonathan Poole is a 68 y.o. male, seen on rounds today.  Pt initially presented to the ED for complaints of Violent Outburst, Psych Eval, and Dementia Currently, the patient is resting.  Physical Exam  BP 101/71 (BP Location: Right Arm)   Pulse 74   Temp 98 F (36.7 C) (Oral)   Resp 18   SpO2 100%  Physical Exam General: NAD  ED Course / MDM  EKG:EKG Interpretation Date/Time:  Friday Jun 19 2023 00:06:46 EDT Ventricular Rate:  70 PR Interval:  236 QRS Duration:  84 QT Interval:  458 QTC Calculation: 494 R Axis:   90  Text Interpretation: Atrial-paced rhythm with prolonged AV conduction Rightward axis Prolonged QT Abnormal ECG When compared with ECG of 19-Mar-2023 22:21, No significant change was found Confirmed by Alissa April ([id number]) on 06/20/2023 1:34:42 AM  I have reviewed the labs performed to date as well as medications administered while in observation.  Recent changes in the last 24 hours include the patient was out of his room multiple times last night but was able to be verbally redirected. Per psychiatry yesterday, pt does not meet criteria for IVC or inpatient psychiatric treatment criteria.   Plan  Current plan is for SW to coordinate disposition. Per SW, family to pick up the patient at noon today.   Of note, pt doesn't meet criteria for outpatient hospice. Will place outpatient referral to palliative care.   Jonathan Concha, MD 06/22/23 [phone]

## 2023-06-22 NOTE — Consult Note (Signed)
 TTS consult ordered for agitation evaluation. Pt has a hx of Dementia, and is currently in boarder status awaiting placement. Pt was seen by psychiatry on 06/19/23 and psych cleared. New consult ordered for medication evaluation for agitation.   After chart review, will start Haldol 5 mg PO or IM q8 PRN for agitation. TTS will sign off.

## 2023-06-22 NOTE — ED Notes (Signed)
 Patient out of room after being redirect several times.  Attempted to go into another patient's room.  Redirect back to his room.

## 2023-06-22 NOTE — ED Notes (Signed)
 Patient awake after sleeping for short amount of time.  Intermittently coming out of room and attempting to go into other rooms or areas.   Patient redirected back to room and reoriented to time and place.

## 2023-06-23 ENCOUNTER — Telehealth: Payer: Self-pay | Admitting: *Deleted

## 2023-06-23 DIAGNOSIS — R9431 Abnormal electrocardiogram [ECG] [EKG]: Secondary | ICD-10-CM | POA: Diagnosis not present

## 2023-06-23 DIAGNOSIS — G40909 Epilepsy, unspecified, not intractable, without status epilepticus: Secondary | ICD-10-CM | POA: Diagnosis not present

## 2023-06-23 DIAGNOSIS — Z79899 Other long term (current) drug therapy: Secondary | ICD-10-CM | POA: Diagnosis not present

## 2023-06-23 DIAGNOSIS — G40A09 Absence epileptic syndrome, not intractable, without status epilepticus: Secondary | ICD-10-CM | POA: Diagnosis not present

## 2023-06-23 DIAGNOSIS — F02818 Dementia in other diseases classified elsewhere, unspecified severity, with other behavioral disturbance: Secondary | ICD-10-CM | POA: Diagnosis not present

## 2023-06-23 DIAGNOSIS — F03918 Unspecified dementia, unspecified severity, with other behavioral disturbance: Secondary | ICD-10-CM | POA: Diagnosis not present

## 2023-06-23 NOTE — Patient Outreach (Signed)
 RN CM reviewed patient last ED note. Discussed with pcp staff (NP, Information systems manager) as patient had a pcp office visit today. Recmmendations for palliative care, f/u with neurology and follow up with home health [facility]).   Outreach to Dr Maxie Spaniel, neurology office left a message & received a call back from Debbie to have Neurology notes faxed to pcp office   Suezette Lafave L. Mcarthur Speedy, RN, BSN, CCM [place]  [facility], [facility] Health RN Care Manager Direct Dial: [phone]  Fax: [phone]

## 2023-06-25 DIAGNOSIS — R569 Unspecified convulsions: Secondary | ICD-10-CM | POA: Diagnosis not present

## 2023-06-25 DIAGNOSIS — G9349 Other encephalopathy: Secondary | ICD-10-CM | POA: Diagnosis not present

## 2023-06-25 DIAGNOSIS — I480 Paroxysmal atrial fibrillation: Secondary | ICD-10-CM | POA: Diagnosis not present

## 2023-06-25 DIAGNOSIS — G309 Alzheimer's disease, unspecified: Secondary | ICD-10-CM | POA: Diagnosis not present

## 2023-06-25 DIAGNOSIS — M51369 Other intervertebral disc degeneration, lumbar region without mention of lumbar back pain or lower extremity pain: Secondary | ICD-10-CM | POA: Diagnosis not present

## 2023-06-25 DIAGNOSIS — F331 Major depressive disorder, recurrent, moderate: Secondary | ICD-10-CM | POA: Diagnosis not present

## 2023-06-25 DIAGNOSIS — M7918 Myalgia, other site: Secondary | ICD-10-CM | POA: Diagnosis not present

## 2023-06-25 DIAGNOSIS — G300 Alzheimer's disease with early onset: Secondary | ICD-10-CM | POA: Diagnosis not present

## 2023-06-25 DIAGNOSIS — E785 Hyperlipidemia, unspecified: Secondary | ICD-10-CM | POA: Diagnosis not present

## 2023-06-25 DIAGNOSIS — G40209 Localization-related (focal) (partial) symptomatic epilepsy and epileptic syndromes with complex partial seizures, not intractable, without status epilepticus: Secondary | ICD-10-CM | POA: Diagnosis not present

## 2023-06-25 DIAGNOSIS — R252 Cramp and spasm: Secondary | ICD-10-CM | POA: Diagnosis not present

## 2023-06-25 DIAGNOSIS — G894 Chronic pain syndrome: Secondary | ICD-10-CM | POA: Diagnosis not present

## 2023-06-25 DIAGNOSIS — I495 Sick sinus syndrome: Secondary | ICD-10-CM | POA: Diagnosis not present

## 2023-06-25 DIAGNOSIS — F0283 Dementia in other diseases classified elsewhere, unspecified severity, with mood disturbance: Secondary | ICD-10-CM | POA: Diagnosis not present

## 2023-06-29 ENCOUNTER — Ambulatory Visit: Attending: Internal Medicine | Admitting: Internal Medicine

## 2023-06-30 ENCOUNTER — Ambulatory Visit (INDEPENDENT_AMBULATORY_CARE_PROVIDER_SITE_OTHER): Payer: Medicare HMO

## 2023-06-30 ENCOUNTER — Encounter: Payer: Self-pay | Admitting: Internal Medicine

## 2023-06-30 DIAGNOSIS — R569 Unspecified convulsions: Secondary | ICD-10-CM | POA: Diagnosis not present

## 2023-06-30 DIAGNOSIS — G309 Alzheimer's disease, unspecified: Secondary | ICD-10-CM | POA: Diagnosis not present

## 2023-06-30 DIAGNOSIS — I495 Sick sinus syndrome: Secondary | ICD-10-CM | POA: Diagnosis not present

## 2023-06-30 DIAGNOSIS — M51369 Other intervertebral disc degeneration, lumbar region without mention of lumbar back pain or lower extremity pain: Secondary | ICD-10-CM | POA: Diagnosis not present

## 2023-06-30 DIAGNOSIS — F331 Major depressive disorder, recurrent, moderate: Secondary | ICD-10-CM | POA: Diagnosis not present

## 2023-06-30 DIAGNOSIS — G894 Chronic pain syndrome: Secondary | ICD-10-CM | POA: Diagnosis not present

## 2023-06-30 DIAGNOSIS — I480 Paroxysmal atrial fibrillation: Secondary | ICD-10-CM | POA: Diagnosis not present

## 2023-06-30 DIAGNOSIS — F0283 Dementia in other diseases classified elsewhere, unspecified severity, with mood disturbance: Secondary | ICD-10-CM | POA: Diagnosis not present

## 2023-06-30 DIAGNOSIS — E785 Hyperlipidemia, unspecified: Secondary | ICD-10-CM | POA: Diagnosis not present

## 2023-06-30 LAB — CUP PACEART REMOTE DEVICE CHECK
Battery Remaining Longevity: 80 mo
Battery Remaining Percentage: 68 %
Battery Voltage: 2.99 V
Brady Statistic AP VP Percent: 1.3 %
Brady Statistic AP VS Percent: 96 %
Brady Statistic AS VP Percent: 1 %
Brady Statistic AS VS Percent: 2.9 %
Brady Statistic RA Percent Paced: 96 %
Brady Statistic RV Percent Paced: 1.3 %
Date Time Interrogation Session: 20250520040016
Implantable Lead Connection Status: 753985
Implantable Lead Connection Status: 753985
Implantable Lead Implant Date: 20100504
Implantable Lead Implant Date: 20100524
Implantable Lead Location: 753859
Implantable Lead Location: 753860
Implantable Pulse Generator Implant Date: 20211122
Lead Channel Impedance Value: 430 Ohm
Lead Channel Impedance Value: 550 Ohm
Lead Channel Pacing Threshold Amplitude: 0.75 V
Lead Channel Pacing Threshold Amplitude: 1 V
Lead Channel Pacing Threshold Pulse Width: 0.3 ms
Lead Channel Pacing Threshold Pulse Width: 0.7 ms
Lead Channel Sensing Intrinsic Amplitude: 4.7 mV
Lead Channel Sensing Intrinsic Amplitude: 5 mV
Lead Channel Setting Pacing Amplitude: 1.25 V
Lead Channel Setting Pacing Amplitude: 1.5 V
Lead Channel Setting Pacing Pulse Width: 0.7 ms
Lead Channel Setting Sensing Sensitivity: 0.7 mV
Pulse Gen Model: 2272
Pulse Gen Serial Number: 3864825

## 2023-07-01 ENCOUNTER — Ambulatory Visit: Payer: Self-pay | Admitting: Internal Medicine

## 2023-07-01 ENCOUNTER — Encounter (INDEPENDENT_AMBULATORY_CARE_PROVIDER_SITE_OTHER): Payer: Self-pay

## 2023-07-02 DIAGNOSIS — I495 Sick sinus syndrome: Secondary | ICD-10-CM | POA: Diagnosis not present

## 2023-07-02 DIAGNOSIS — F331 Major depressive disorder, recurrent, moderate: Secondary | ICD-10-CM | POA: Diagnosis not present

## 2023-07-02 DIAGNOSIS — I480 Paroxysmal atrial fibrillation: Secondary | ICD-10-CM | POA: Diagnosis not present

## 2023-07-02 DIAGNOSIS — F0283 Dementia in other diseases classified elsewhere, unspecified severity, with mood disturbance: Secondary | ICD-10-CM | POA: Diagnosis not present

## 2023-07-02 DIAGNOSIS — R569 Unspecified convulsions: Secondary | ICD-10-CM | POA: Diagnosis not present

## 2023-07-02 DIAGNOSIS — E785 Hyperlipidemia, unspecified: Secondary | ICD-10-CM | POA: Diagnosis not present

## 2023-07-02 DIAGNOSIS — G894 Chronic pain syndrome: Secondary | ICD-10-CM | POA: Diagnosis not present

## 2023-07-02 DIAGNOSIS — G309 Alzheimer's disease, unspecified: Secondary | ICD-10-CM | POA: Diagnosis not present

## 2023-07-02 DIAGNOSIS — M51369 Other intervertebral disc degeneration, lumbar region without mention of lumbar back pain or lower extremity pain: Secondary | ICD-10-CM | POA: Diagnosis not present

## 2023-07-09 DIAGNOSIS — M51369 Other intervertebral disc degeneration, lumbar region without mention of lumbar back pain or lower extremity pain: Secondary | ICD-10-CM | POA: Diagnosis not present

## 2023-07-09 DIAGNOSIS — G309 Alzheimer's disease, unspecified: Secondary | ICD-10-CM | POA: Diagnosis not present

## 2023-07-09 DIAGNOSIS — R569 Unspecified convulsions: Secondary | ICD-10-CM | POA: Diagnosis not present

## 2023-07-09 DIAGNOSIS — F0283 Dementia in other diseases classified elsewhere, unspecified severity, with mood disturbance: Secondary | ICD-10-CM | POA: Diagnosis not present

## 2023-07-09 DIAGNOSIS — G894 Chronic pain syndrome: Secondary | ICD-10-CM | POA: Diagnosis not present

## 2023-07-09 DIAGNOSIS — E785 Hyperlipidemia, unspecified: Secondary | ICD-10-CM | POA: Diagnosis not present

## 2023-07-09 DIAGNOSIS — I495 Sick sinus syndrome: Secondary | ICD-10-CM | POA: Diagnosis not present

## 2023-07-09 DIAGNOSIS — F331 Major depressive disorder, recurrent, moderate: Secondary | ICD-10-CM | POA: Diagnosis not present

## 2023-07-09 DIAGNOSIS — I480 Paroxysmal atrial fibrillation: Secondary | ICD-10-CM | POA: Diagnosis not present

## 2023-07-17 ENCOUNTER — Other Ambulatory Visit: Payer: Self-pay | Admitting: *Deleted

## 2023-07-17 NOTE — Patient Instructions (Signed)
 Visit Information  Thank you for taking time to visit with me today. Please don't hesitate to contact me if I can be of assistance to you before our next scheduled appointment.  Your next care management appointment is by telephone on 07/21/23 at 2:30 pm     Please call the care guide team at [phone] if you need to cancel, schedule, or reschedule an appointment.   Please call the Suicide and Crisis Lifeline: 988 call the [place]  National Suicide Prevention Lifeline: 318 286 0215 or TTY: 3524862891 TTY 848-631-2481) to talk to a trained counselor call 1-800-273-TALK (toll free, 24 hour hotline) call the [facility]: 585-737-6929 call 911 if you are experiencing a Mental Health or Behavioral Health Crisis or need someone to talk to.  Dayan Desa L. Mcarthur Speedy, RN, BSN, CCM [place]  [facility], [facility] Health RN Care Manager Direct Dial: [phone]  Fax: [phone]

## 2023-07-17 NOTE — Patient Outreach (Signed)
 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 I obtained verbal consent from Caregiver.  Visit completed with wife Jonathan Poole  on the phone  Reports she was informed recently about a petition of guardianship, incompetence was filed after 30 days  reports she and Jonathan Poole are upset about the petition of guardianship   She does not have notarized POA Patient discharged from home health nurse last week Spoke with lawyer receptionist about the lawyer's fees but can not afford $[phone] to hire the lawyer She & son Jonathan Poole have been researching online  August 10 2023 court date has been scheduled Jonathan Poole had questions that RN CM referred her back to APS & pcp/hospital medical records departments Discussed documented visits & outreaches Reports for no show 06/29/23 cardiology visit note   Coordination/connected Jonathan Poole with [employer] [facility] notary & gave her this number [phone]   Background:   Past Medical History:  Diagnosis Date   Atrial fibrillation (HCC)    Dementia (HCC)    Heart disease    Hyperchloremia    Seizures (HCC)     Assessment: Patient Reported Symptoms:  Cognitive Cognitive Status: Other:, Able to follow simple commands, Poor judgment in daily scenarios, Requires Assistance Decision Making, Confused or disoriented (alert to person, place, family) Cognitive/Intellectual Conditions Management [RPT]: None reported or documented in medical history or problem list Other: wife report APS active, Jonathan Poole discussed being informed recently about a petition of guardianship Initiated by APS for incompetency "filed after thirty days". 07/17/23 wife spoke with a lawyer but confirms she can not afford to hire a lawyer   Health Maintenance Behaviors: Annual  physical exam, Sleep adequate Health Facilitated by: Rest  Neurological Neurological Review of Symptoms: No symptoms reported Neurological Conditions: Seizures, Dementia, Alzheimer's disease Neurological Management Strategies: Activity, Adequate rest, Routine screening, Medication therapy Neurological Self-Management Outcome: 3 (uncertain) Neurological Comment: wife has seen patient with a blank stare &  hand movements and is unsure if these movements now only seen when sleeping are "silent seizures" or "sleeping reflexes"  HEENT HEENT Symptoms Reported: Not assessed      Cardiovascular Cardiovascular Symptoms Reported: No symptoms reported Cardiovascular Comment: discussed a no show cardiology office visit noted in EPIC for 06/29/23. Jonathan Poole states it was cancelled  Respiratory Respiratory Symptoms Reported: No symptoms reported Respiratory Self-Management Outcome: 4 (good)  Endocrine Patient reports the following symptoms related to hypoglycemia or hyperglycemia : Weakness or fatigue Is patient diabetic?: No Endocrine Management Strategies: Activity, Adequate rest, Routine screening Endocrine Self-Management Outcome: 4 (good)  Gastrointestinal Gastrointestinal Symptoms Reported: Incontinence Gastrointestinal Management Strategies: Adequate rest, Incontinence garment/pad Gastrointestinal Self-Management Outcome: 4 (good) Nutrition Risk Screen (CP): No indicators present  Genitourinary Genitourinary Symptoms Reported: Incontinence Genitourinary Management Strategies: Adequate rest, Incontinence garment/pad Genitourinary Self-Management Outcome: 4 (good)  Integumentary Integumentary Symptoms Reported: No symptoms reported Additional Integumentary Details: rash resolved Skin Management Strategies: Adequate rest, Routine screening Skin Self-Management Outcome: 4 (good)  Musculoskeletal Musculoskelatal Symptoms Reviewed: Weakness Musculoskeletal Conditions: Back pain, Joint pain, Unsteady  gait Musculoskeletal Management Strategies: Adequate rest, Medication therapy, Routine screening Musculoskeletal Self-Management Outcome: 4 (good) Falls in the past year?: Yes Number of falls in past year: 2 or more Was there an injury with Fall?: Yes Fall Risk Category Calculator: 3 Patient Fall Risk Level: High Fall Risk Patient  at Risk for Falls Due to: History of fall(s), Mental status change Fall risk Follow up: Falls evaluation completed  Psychosocial Psychosocial Symptoms Reported: Other Additional Psychological Details: continues with dementia/confusion but not worsening Behavioral Health Conditions: Alzheimer's disease, Dementia Behavioral Management Strategies: Activity, Adequate rest, Medication therapy, Support system Behavioral Health Self-Management Outcome: 4 (good) Major Change/Loss/Stressor/Fears (CP): Medical condition, self, Resources Techniques to [place] with Loss/Stress/Change: Medication Quality of Family Relationships: helpful      05/21/2023    7:58 PM  Depression screen PHQ 2/9  Trouble concentrating 1  Moving slowly or fidgety/restless 1  Suicidal thoughts 0  Difficult doing work/chores Somewhat difficult    There were no vitals filed for this visit.  Medications Reviewed Today     Reviewed by Arlyce Berger, RN (Registered Nurse) on 07/20/23 at 1628  Med List Status: <None>   Medication Order Taking? Sig Documenting Provider Last Dose Status Informant  acetaminophen  (TYLENOL ) 500 MG tablet [id number] Yes Take 500-1,000 mg by mouth every 6 (six) hours as needed (when not taking Ibuprofen). [provider] Taking Active Spouse/Significant Other  apixaban  (ELIQUIS ) 5 MG TABS tablet [id number] Yes Take 1 tablet (5 mg total) by mouth 2 (two) times daily. Tammie Fall, MD Taking Active Spouse/Significant Other  Buprenorphine  HCl-Naloxone  HCl 8-2 MG FILM [id number] Yes Place 1 Film under the tongue 3 (three) times daily. [provider]  Taking Active Spouse/Significant Other  cyanocobalamin  (VITAMIN B12) 1000 MCG/ML injection [id number] Yes Inject 1,000 mcg into the muscle every 30 (thirty) days. [provider] Taking Active Spouse/Significant Other  donepezil  (ARICEPT ) 10 MG tablet [id number] Yes TAKE 1 TABLET AT BEDTIME  Patient taking differently: Take 10 mg by mouth in the morning and at bedtime.   Meldon Sport, MD Taking Active Spouse/Significant Other  gabapentin  (NEURONTIN ) 800 MG tablet [id number] Yes Take 800 mg by mouth 4 (four) times daily. [provider] Taking Active Spouse/Significant Other  Ibuprofen 200 MG CAPS [id number] Yes Take 800 mg by mouth every 6 (six) hours as needed (for pain- when not taking Tylenol ). [provider] Taking Active Spouse/Significant Other  levETIRAcetam  (KEPPRA ) 1000 MG tablet [id number] Yes Take 1 tablet (1,000 mg total) by mouth 2 (two) times daily. Wynetta Heckle, MD Taking Active Spouse/Significant Other  memantine  (NAMENDA ) 10 MG tablet [id number] Yes Take 10 mg by mouth in the morning and at bedtime. [provider] Taking Active Spouse/Significant Other  naloxone  (NARCAN ) nasal spray 4 mg/0.1 mL [id number] Yes Place 1 spray into the nose as directed. [provider] Taking Active Spouse/Significant Other  REXULTI  3 MG TABS [id number] Yes Take 3 mg by mouth in the morning. [provider] Taking Active Spouse/Significant Other  rosuvastatin  (CRESTOR ) 20 MG tablet [id number] Yes TAKE 1 TABLET ONE TIME DAILY  Patient taking differently: Take 20 mg by mouth in the morning.   Meldon Sport, MD Taking Active Spouse/Significant Other  tiZANidine  (ZANAFLEX ) 4 MG tablet [id number] Yes Take 1 tablet (4 mg total) by mouth 3 (three) times daily.  Patient taking differently: Take 4 mg by mouth in the morning, at noon, in the evening, and at bedtime.   Zarwolo, Gloria, FNP Taking Active Spouse/Significant Other            Recommendation:    Continue Current Plan of Care Recommend wife meet with local notary 07/21/23 and obtain records from pcp & hospital Recommend wife speak with APS staff as needed  Follow Up Plan:   Telephone follow up  appointment date/time:  07/21/23    Lynford Sarin. Mcarthur Speedy, RN, BSN, CCM [place]  [facility], [facility] Health RN Care Manager Direct Dial: [phone]  Fax: [phone]

## 2023-07-20 DIAGNOSIS — G40209 Localization-related (focal) (partial) symptomatic epilepsy and epileptic syndromes with complex partial seizures, not intractable, without status epilepticus: Secondary | ICD-10-CM | POA: Insufficient documentation

## 2023-07-21 ENCOUNTER — Other Ambulatory Visit: Payer: Self-pay | Admitting: *Deleted

## 2023-07-28 ENCOUNTER — Encounter (HOSPITAL_COMMUNITY): Payer: Self-pay | Admitting: *Deleted

## 2023-07-28 ENCOUNTER — Inpatient Hospital Stay (HOSPITAL_COMMUNITY)
Admission: EM | Admit: 2023-07-28 | Discharge: 2023-08-04 | DRG: 070 | Disposition: A | Discharge status: Skilled Nursing Facility | Attending: Family Medicine | Admitting: Family Medicine

## 2023-07-28 ENCOUNTER — Emergency Department (HOSPITAL_COMMUNITY)

## 2023-07-28 DIAGNOSIS — E860 Dehydration: Principal | ICD-10-CM | POA: Diagnosis present

## 2023-07-28 DIAGNOSIS — E872 Acidosis, unspecified: Secondary | ICD-10-CM | POA: Diagnosis present

## 2023-07-28 DIAGNOSIS — E782 Mixed hyperlipidemia: Secondary | ICD-10-CM | POA: Diagnosis present

## 2023-07-28 DIAGNOSIS — G9341 Metabolic encephalopathy: Secondary | ICD-10-CM | POA: Diagnosis present

## 2023-07-28 DIAGNOSIS — G40909 Epilepsy, unspecified, not intractable, without status epilepticus: Secondary | ICD-10-CM | POA: Diagnosis present

## 2023-07-28 DIAGNOSIS — Z66 Do not resuscitate: Secondary | ICD-10-CM | POA: Diagnosis not present

## 2023-07-28 DIAGNOSIS — G8929 Other chronic pain: Secondary | ICD-10-CM | POA: Diagnosis present

## 2023-07-28 DIAGNOSIS — F0280 Dementia in other diseases classified elsewhere without behavioral disturbance: Secondary | ICD-10-CM | POA: Diagnosis not present

## 2023-07-28 DIAGNOSIS — R55 Syncope and collapse: Secondary | ICD-10-CM | POA: Diagnosis not present

## 2023-07-28 DIAGNOSIS — Z6821 Body mass index (BMI) 21.0-21.9, adult: Secondary | ICD-10-CM

## 2023-07-28 DIAGNOSIS — R279 Unspecified lack of coordination: Secondary | ICD-10-CM | POA: Diagnosis not present

## 2023-07-28 DIAGNOSIS — I482 Chronic atrial fibrillation, unspecified: Secondary | ICD-10-CM | POA: Diagnosis present

## 2023-07-28 DIAGNOSIS — R569 Unspecified convulsions: Secondary | ICD-10-CM

## 2023-07-28 DIAGNOSIS — Z7901 Long term (current) use of anticoagulants: Secondary | ICD-10-CM | POA: Diagnosis not present

## 2023-07-28 DIAGNOSIS — G309 Alzheimer's disease, unspecified: Secondary | ICD-10-CM | POA: Diagnosis present

## 2023-07-28 DIAGNOSIS — H1031 Unspecified acute conjunctivitis, right eye: Secondary | ICD-10-CM | POA: Diagnosis present

## 2023-07-28 DIAGNOSIS — R4182 Altered mental status, unspecified: Secondary | ICD-10-CM | POA: Diagnosis not present

## 2023-07-28 DIAGNOSIS — E876 Hypokalemia: Secondary | ICD-10-CM | POA: Diagnosis present

## 2023-07-28 DIAGNOSIS — N179 Acute kidney failure, unspecified: Secondary | ICD-10-CM | POA: Diagnosis present

## 2023-07-28 DIAGNOSIS — Z87891 Personal history of nicotine dependence: Secondary | ICD-10-CM | POA: Diagnosis not present

## 2023-07-28 DIAGNOSIS — E43 Unspecified severe protein-calorie malnutrition: Secondary | ICD-10-CM | POA: Diagnosis present

## 2023-07-28 DIAGNOSIS — R404 Transient alteration of awareness: Secondary | ICD-10-CM | POA: Diagnosis not present

## 2023-07-28 DIAGNOSIS — M6281 Muscle weakness (generalized): Secondary | ICD-10-CM | POA: Diagnosis not present

## 2023-07-28 DIAGNOSIS — I6782 Cerebral ischemia: Secondary | ICD-10-CM | POA: Diagnosis not present

## 2023-07-28 DIAGNOSIS — F03918 Unspecified dementia, unspecified severity, with other behavioral disturbance: Secondary | ICD-10-CM | POA: Diagnosis not present

## 2023-07-28 DIAGNOSIS — D696 Thrombocytopenia, unspecified: Secondary | ICD-10-CM | POA: Diagnosis present

## 2023-07-28 DIAGNOSIS — F0390 Unspecified dementia without behavioral disturbance: Secondary | ICD-10-CM | POA: Diagnosis not present

## 2023-07-28 DIAGNOSIS — R0902 Hypoxemia: Secondary | ICD-10-CM | POA: Diagnosis present

## 2023-07-28 DIAGNOSIS — R1312 Dysphagia, oropharyngeal phase: Secondary | ICD-10-CM | POA: Diagnosis not present

## 2023-07-28 DIAGNOSIS — F02818 Dementia in other diseases classified elsewhere, unspecified severity, with other behavioral disturbance: Secondary | ICD-10-CM | POA: Diagnosis present

## 2023-07-28 DIAGNOSIS — Z950 Presence of cardiac pacemaker: Secondary | ICD-10-CM

## 2023-07-28 DIAGNOSIS — R0989 Other specified symptoms and signs involving the circulatory and respiratory systems: Secondary | ICD-10-CM | POA: Diagnosis not present

## 2023-07-28 DIAGNOSIS — R0600 Dyspnea, unspecified: Secondary | ICD-10-CM | POA: Diagnosis not present

## 2023-07-28 DIAGNOSIS — R231 Pallor: Secondary | ICD-10-CM | POA: Diagnosis not present

## 2023-07-28 DIAGNOSIS — R41841 Cognitive communication deficit: Secondary | ICD-10-CM | POA: Diagnosis not present

## 2023-07-28 DIAGNOSIS — R000 Tachycardia, unspecified: Secondary | ICD-10-CM | POA: Diagnosis not present

## 2023-07-28 DIAGNOSIS — G9349 Other encephalopathy: Secondary | ICD-10-CM

## 2023-07-28 LAB — BLOOD GAS, VENOUS
Acid-Base Excess: 5 mmol/L — ABNORMAL HIGH (ref 0.0–2.0)
Bicarbonate: 32.2 mmol/L — ABNORMAL HIGH (ref 20.0–28.0)
O2 Saturation: 48.6 %
Patient temperature: 37.2
pCO2, Ven: 58 mmHg (ref 44–60)
pH, Ven: 7.36 (ref 7.25–7.43)
pO2, Ven: 34 mmHg (ref 32–45)

## 2023-07-28 LAB — URINALYSIS, W/ REFLEX TO CULTURE (INFECTION SUSPECTED)
Bilirubin Urine: NEGATIVE
Glucose, UA: NEGATIVE mg/dL
Ketones, ur: 5 mg/dL — AB
Leukocytes,Ua: NEGATIVE
Nitrite: NEGATIVE
Protein, ur: 30 mg/dL — AB
RBC / HPF: >50 RBC/hpf (ref 0–5)
Specific Gravity, Urine: 1.02 (ref 1.005–1.030)
pH: 5 (ref 5.0–8.0)

## 2023-07-28 LAB — RAPID URINE DRUG SCREEN, HOSP PERFORMED
Amphetamines: NOT DETECTED
Barbiturates: NOT DETECTED
Benzodiazepines: NOT DETECTED
Cocaine: NOT DETECTED
Opiates: NOT DETECTED
Tetrahydrocannabinol: NOT DETECTED

## 2023-07-28 LAB — TSH: TSH: 1.872 u[IU]/mL (ref 0.350–4.500)

## 2023-07-28 LAB — COMPREHENSIVE METABOLIC PANEL WITH GFR
ALT: 60 U/L — ABNORMAL HIGH (ref 0–44)
AST: 41 U/L (ref 15–41)
Albumin: 4.1 g/dL (ref 3.5–5.0)
Alkaline Phosphatase: 66 U/L (ref 38–126)
Anion gap: 15 (ref 5–15)
BUN: 19 mg/dL (ref 8–23)
CO2: 24 mmol/L (ref 22–32)
Calcium: 9.3 mg/dL (ref 8.9–10.3)
Chloride: 99 mmol/L (ref 98–111)
Creatinine, Ser: 1.29 mg/dL — ABNORMAL HIGH (ref 0.61–1.24)
GFR, Estimated: >60 mL/min (ref >60)
Glucose, Bld: 122 mg/dL — ABNORMAL HIGH (ref 70–99)
Potassium: 4.4 mmol/L (ref 3.5–5.1)
Sodium: 138 mmol/L (ref 135–145)
Total Bilirubin: 1.5 mg/dL — ABNORMAL HIGH (ref 0.0–1.2)
Total Protein: 6.8 g/dL (ref 6.5–8.1)

## 2023-07-28 LAB — CBC
HCT: 41.4 % (ref 39.0–52.0)
Hemoglobin: 14.1 g/dL (ref 13.0–17.0)
MCH: 29.7 pg (ref 26.0–34.0)
MCHC: 34.1 g/dL (ref 30.0–36.0)
MCV: 87.3 fL (ref 80.0–100.0)
Platelets: 142 10*3/uL — ABNORMAL LOW (ref 150–400)
RBC: 4.74 MIL/uL (ref 4.22–5.81)
RDW: 12.2 % (ref 11.5–15.5)
WBC: 6.1 10*3/uL (ref 4.0–10.5)
nRBC: 0 % (ref 0.0–0.2)

## 2023-07-28 LAB — AMMONIA: Ammonia: 32 umol/L (ref 9–35)

## 2023-07-28 LAB — CBG MONITORING, ED: Glucose-Capillary: 116 mg/dL — ABNORMAL HIGH (ref 70–99)

## 2023-07-28 LAB — ETHANOL: Alcohol, Ethyl (B): <15 mg/dL (ref <15)

## 2023-07-28 LAB — LACTIC ACID, PLASMA: Lactic Acid, Venous: 2.8 mmol/L (ref 0.5–1.9)

## 2023-07-28 MED ORDER — SODIUM CHLORIDE 0.9 % IV BOLUS
1000.0000 mL | Freq: Once | INTRAVENOUS | Status: AC
  Administered 2023-07-28: 1000 mL via INTRAVENOUS

## 2023-07-28 NOTE — ED Notes (Signed)
 Per wife pt reports symptom onset last night at midnight with staring off and not wanting to go to bed, pt reported to have ongoing symptoms, EMS did not report neuro baseline. Pt reported to be taking Suboxone . Pt RR 8 with some improvement with stimulation. Pt follows commands, will open eyes when asked. Pt lives at home with his wife and son per EMS

## 2023-07-28 NOTE — ED Provider Notes (Signed)
 [place] EMERGENCY DEPARTMENT AT [facility] Provider Note  CSN: [id number] Arrival date & time: 07/28/23 1805  Chief Complaint(s) Altered Mental Status  HPI Jonathan Poole is a 68 y.o. male history of atrial fibrillation, dementia, seizures presenting to the emergency department with altered mental status.  Per paramedics, patient had some decreased responsiveness since yesterday night, somnolence.  No reported shaking or obvious seizure activity, fevers or other symptoms.  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   Dementia with behavioral disturbance (HCC) 06/19/2023   Memory loss 06/05/2023   Restlessness and agitation 06/05/2023   Encephalopathy 06/04/2023   Hyperbilirubinemia 06/04/2023   Thrombocytopenia (HCC) 06/04/2023   Hyperchloremia 05/29/2023   Recurrent falls 05/29/2023   Vitamin B12 deficiency (non anemic) 05/25/2023   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   Chronic pain syndrome 04/08/2023   Encounter for general adult medical examination with abnormal findings 02/05/2022   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/22/2021   Chronic prescription opiate use 03/21/2021   H/O cardiac pacemaker 04/12/2020   Chronic atrial fibrillation (HCC) 04/12/2020   Alzheimer's disease (HCC) 04/12/2020   DDD (degenerative disc disease), lumbar 04/12/2020   Hyperlipidemia 04/12/2020   Sinus node dysfunction (HCC) 01/02/2020   Elevated PSA 06/20/2019   Home Medication(s) Prior to Admission medications   Medication Sig Start Date End Date Taking? Authorizing Provider  acetaminophen  (TYLENOL ) 500 MG  tablet Take 500-1,000 mg by mouth every 6 (six) hours as needed (when not taking Ibuprofen).   Yes [provider]  apixaban  (ELIQUIS ) 5 MG TABS tablet Take 1 tablet (5 mg total) by mouth 2 (two) times daily. 05/18/23  Yes Tammie Fall, MD  Buprenorphine  HCl-Naloxone  HCl 8-2 MG FILM Place 1 Film under the tongue 3 (three) times daily. 02/21/23  Yes [provider]  cyanocobalamin  (VITAMIN B12) 1000 MCG/ML injection Inject 1,000 mcg into the muscle every 30 (thirty) days. 03/28/23  Yes [provider]  donepezil  (ARICEPT ) 10 MG tablet TAKE 1 TABLET AT BEDTIME Patient taking differently: Take 10 mg by mouth in the morning and at bedtime. 06/16/22  Yes Meldon Sport, MD  gabapentin  (NEURONTIN ) 800 MG tablet Take 800 mg by mouth 4 (four) times daily. 06/02/23  Yes [provider]  Ibuprofen 200 MG CAPS Take 800 mg by mouth every 6 (six) hours as needed (for pain- when not taking Tylenol ).   Yes [provider]  levETIRAcetam  (KEPPRA ) 1000 MG tablet Take 1 tablet (1,000 mg total) by mouth 2 (two) times daily. 06/05/23  Yes Wynetta Heckle, MD  memantine  (NAMENDA ) 10 MG tablet Take 10 mg by mouth in the morning and at bedtime. 05/09/23  Yes [provider]  naloxone  (NARCAN ) nasal spray 4 mg/0.1 mL Place 1 spray into the nose once as needed (OD). 01/28/22  Yes [provider]  QUEtiapine (SEROQUEL) 50 MG tablet Take 50 mg by mouth 3 (three) times daily as needed (mood).   Yes [provider]  REXULTI  3 MG TABS Take 3 mg by mouth in the morning.   Yes [provider]  rosuvastatin  (CRESTOR ) 20 MG tablet TAKE 1 TABLET ONE TIME DAILY Patient taking differently: Take 20 mg by mouth in the morning. 02/26/22  Yes Meldon Sport, MD  tiZANidine  (ZANAFLEX ) 4 MG tablet Take 1 tablet (4 mg total) by mouth 3 (three) times daily. Patient taking differently: Take 4 mg by mouth every 6 (six) hours as needed for muscle spasms. 07/31/21  Yes  Zarwolo, Gloria, FNP                                                                                                                                    Past Surgical History Past Surgical History:  Procedure Laterality Date   PACEMAKER IMPLANT     PPM GENERATOR CHANGEOUT N/A 01/02/2020   Procedure: PPM GENERATOR CHANGEOUT;  Surgeon: Tammie Fall, MD;  Location: [facility] INVASIVE CV LAB;  Service: Cardiovascular;  Laterality: N/A;   Family History Family History  Problem Relation Age of Onset   Heart disease Father    Prostate cancer Paternal Grandfather     Social History Social History   Tobacco Use   Smoking status: Former    Current packs/day: 0.00    Average packs/day: 1 pack/day for 15.0 years (15.0 ttl pk-yrs)    Types: Cigarettes    Start date: 5    Quit date: 2001    Years since quitting: 24.4    Passive exposure: Past   Smokeless tobacco: Never   Tobacco comments:    Verified by Wife, Amalia Badder Amore    former smoker - quit 2015, on/off 40 years, 40 pack year history  Vaping Use   Vaping status: Former  Substance Use Topics   Alcohol use: Never   Drug use: Not Currently    Types: Marijuana   Allergies Patient has no known allergies.  Review of Systems Review of Systems  Unable to perform ROS: Dementia    Physical Exam Vital Signs  I have reviewed the triage vital signs BP 107/63   Pulse 73   Temp 99 F (37.2 C) (Rectal)   Resp 11   SpO2 99%  Physical Exam Vitals and nursing note reviewed.  Constitutional:      General: He is not in acute distress.    Appearance: Normal appearance.  HENT:     Head: Normocephalic and atraumatic.     Mouth/Throat:     Mouth: Mucous membranes are dry.   Eyes:     Conjunctiva/sclera: Conjunctivae normal.    Cardiovascular:     Rate and Rhythm: Normal rate and regular rhythm.  Pulmonary:     Effort: Pulmonary effort is normal. No respiratory distress.     Breath sounds: Normal breath sounds.  Abdominal:      General: Abdomen is flat.     Palpations: Abdomen is soft.     Tenderness: There is no abdominal tenderness.   Musculoskeletal:     Right lower leg: No edema.  Left lower leg: No edema.   Skin:    General: Skin is warm and dry.     Capillary Refill: Capillary refill takes less than 2 seconds.   Neurological:     Mental Status: He is alert.     Comments: Slow to respond, obtunded but arousable.  States his own name, states he is in the hospital, does not know why.  Does not know the year.  Moves all 4 extremities and follows commands in all 4 extremities  Psychiatric:        Mood and Affect: Mood normal.        Behavior: Behavior normal.     ED Results and Treatments Labs (all labs ordered are listed, but only abnormal results are displayed) Labs Reviewed  COMPREHENSIVE METABOLIC PANEL WITH GFR - Abnormal; Notable for the following components:      Result Value   Glucose, Bld 122 (*)    Creatinine, Ser 1.29 (*)    ALT 60 (*)    Total Bilirubin 1.5 (*)    All other components within normal limits  CBC - Abnormal; Notable for the following components:   Platelets 142 (*)    All other components within normal limits  URINALYSIS, W/ REFLEX TO CULTURE (INFECTION SUSPECTED) - Abnormal; Notable for the following components:   APPearance HAZY (*)    Hgb urine dipstick LARGE (*)    Ketones, ur 5 (*)    Protein, ur 30 (*)    Bacteria, UA RARE (*)    All other components within normal limits  LACTIC ACID, PLASMA - Abnormal; Notable for the following components:   Lactic Acid, Venous 3.0 (*)    All other components within normal limits  LACTIC ACID, PLASMA - Abnormal; Notable for the following components:   Lactic Acid, Venous 2.8 (*)    All other components within normal limits  BLOOD GAS, VENOUS - Abnormal; Notable for the following components:   Bicarbonate 32.2 (*)    Acid-Base Excess 5.0 (*)    All other components within normal limits  CBG MONITORING, ED - Abnormal;  Notable for the following components:   Glucose-Capillary 116 (*)    All other components within normal limits  CULTURE, BLOOD (ROUTINE X 2)  CULTURE, BLOOD (ROUTINE X 2)  TSH  AMMONIA  ETHANOL  RAPID URINE DRUG SCREEN, HOSP PERFORMED                                                                                                                          Radiology CT Head Wo Contrast Result Date: 07/28/2023 CLINICAL DATA:  Mental status change EXAM: CT HEAD WITHOUT CONTRAST TECHNIQUE: Contiguous axial images were obtained from the base of the skull through the vertex without intravenous contrast. RADIATION DOSE REDUCTION: This exam was performed according to the departmental dose-optimization program which includes automated exposure control, adjustment of the mA and/or kV according to patient size and/or use of iterative reconstruction technique. COMPARISON:  CT  06/09/2023 FINDINGS: Brain: No acute territorial infarction, hemorrhage or intracranial mass. Atrophy and chronic small vessel ischemic changes of the white matter. The ventricles are nonenlarged. Vascular: No hyperdense vessel or unexpected calcification. Skull: Normal. Negative for fracture or focal lesion. Sinuses/Orbits: No acute finding. Other: None IMPRESSION: 1. No CT evidence for acute intracranial abnormality. 2. Atrophy and chronic small vessel ischemic changes of the white matter. Electronically Signed   By: Esmeralda Hedge M.D.   On: 07/28/2023 19:59   DG Chest Portable 1 View Result Date: 07/28/2023 CLINICAL DATA:  Dyspnea. EXAM: PORTABLE CHEST 1 VIEW COMPARISON:  None Available. FINDINGS: Low lung volumes. The heart size and mediastinal contours are within normal limits. 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 No focal consolidation, sizeable pleural effusion, or pneumothorax. No acute osseous abnormality. IMPRESSION: Low lung volumes.  No acute cardiopulmonary findings. Electronically Signed   By: Mannie Seek M.D.    On: 07/28/2023 18:58    Pertinent labs & imaging results that were available during my care of the patient were reviewed by me and considered in my medical decision making (see MDM for details).  Medications Ordered in ED Medications  sodium chloride  0.9 % bolus 1,000 mL (0 mLs Intravenous Stopped 07/28/23 1957)  sodium chloride  0.9 % bolus 1,000 mL (0 mLs Intravenous Stopped 07/28/23 2057)                                                                                                                                     Procedures .Critical Care  Performed by: Mordecai Applebaum, MD Authorized by: Mordecai Applebaum, MD   Critical care provider statement:    Critical care time (minutes):  30   Critical care was necessary to treat or prevent imminent or life-threatening deterioration of the following conditions:  Dehydration   Critical care was time spent personally by me on the following activities:  Development of treatment plan with patient or surrogate, discussions with consultants, evaluation of patient's response to treatment, examination of patient, ordering and review of laboratory studies, ordering and review of radiographic studies, ordering and performing treatments and interventions, pulse oximetry, re-evaluation of patient's condition and review of old charts   Care discussed with: admitting provider     (including critical care time)  Medical Decision Making / ED Course   MDM:  68 year old presenting to the emergency department with altered mental status.  Patient mildly ill-appearing, no acute distress.  Vitals with no fever, checked rectally with no fever.  Did have some mild hypotension and appears dehydrated.  Considered infection such as sepsis, blood cultures obtained, but without fever or focal source would defer antibiotics.  No leukocytosis.  Does have lactic acidosis, mild AKI, concerning for dehydration.  Chest x-ray clear, urinalysis not consistent with UTI,  no abdominal tenderness to suggest intra-abdominal infection.  Given altered mental status obtain CT head which was stable without acute finding.  Patient has some persistent lactic acidosis despite fluid resuscitation so we will give additional fluids.  He does appear improved after fluid resuscitation and seems much more alert.  Will admit for observation.  Discussed with hospitalist Dr. Elyse Hand who will admit patient.      Additional history obtained: -Additional history obtained from ems -External records from outside source obtained and reviewed including: Chart review including previous notes, labs, imaging, consultation notes including prior notes    Lab Tests: -I ordered, reviewed, and interpreted labs.   The pertinent results include:   Labs Reviewed  COMPREHENSIVE METABOLIC PANEL WITH GFR - Abnormal; Notable for the following components:      Result Value   Glucose, Bld 122 (*)    Creatinine, Ser 1.29 (*)    ALT 60 (*)    Total Bilirubin 1.5 (*)    All other components within normal limits  CBC - Abnormal; Notable for the following components:   Platelets 142 (*)    All other components within normal limits  URINALYSIS, W/ REFLEX TO CULTURE (INFECTION SUSPECTED) - Abnormal; Notable for the following components:   APPearance HAZY (*)    Hgb urine dipstick LARGE (*)    Ketones, ur 5 (*)    Protein, ur 30 (*)    Bacteria, UA RARE (*)    All other components within normal limits  LACTIC ACID, PLASMA - Abnormal; Notable for the following components:   Lactic Acid, Venous 3.0 (*)    All other components within normal limits  LACTIC ACID, PLASMA - Abnormal; Notable for the following components:   Lactic Acid, Venous 2.8 (*)    All other components within normal limits  BLOOD GAS, VENOUS - Abnormal; Notable for the following components:   Bicarbonate 32.2 (*)    Acid-Base Excess 5.0 (*)    All other components within normal limits  CBG MONITORING, ED - Abnormal; Notable for  the following components:   Glucose-Capillary 116 (*)    All other components within normal limits  CULTURE, BLOOD (ROUTINE X 2)  CULTURE, BLOOD (ROUTINE X 2)  TSH  AMMONIA  ETHANOL  RAPID URINE DRUG SCREEN, HOSP PERFORMED    Notable for lactic acidosis,   Imaging Studies ordered: I ordered imaging studies including CXR, CT head  On my interpretation imaging demonstrates no acute process I independently visualized and interpreted imaging. I agree with the radiologist interpretation   Medicines ordered and prescription drug management: Meds ordered this encounter  Medications   sodium chloride  0.9 % bolus 1,000 mL   sodium chloride  0.9 % bolus 1,000 mL    -I have reviewed the patients home medicines and have made adjustments as needed  Reevaluation: After the interventions noted above, I reevaluated the patient and found that their symptoms have improved  Co morbidities that complicate the patient evaluation  Past Medical History:  Diagnosis Date   Atrial fibrillation (HCC)    Dementia (HCC)    Heart disease    Hyperchloremia    Seizures (HCC)       Dispostion: Disposition decision including need for hospitalization was considered, and patient admitted to the hospital.    Final Clinical Impression(s) / ED Diagnoses Final diagnoses:  Dehydration  Lactic acidosis     This chart was dictated using voice recognition software.  Despite best efforts to proofread,  errors can occur which can change the documentation meaning.    Mordecai Applebaum, MD 07/28/23 2238

## 2023-07-28 NOTE — ED Triage Notes (Addendum)
 EMS found patient sitting in room, rigid, unable to stand, with weakness. Staring spells history possible silent seizures. Non verbal. Wife noticed change in mentation around midnight. History of alzheimers. Patient not able to follow commands, staring off, with no improvement. EMS stated, prolonged QT, with pacemaker spikes on demand. Equal but weak grip strengths per EMS. Takes elloquis. 89 % on RA at times. Placed on 2 L [place] now.

## 2023-07-29 ENCOUNTER — Inpatient Hospital Stay (HOSPITAL_COMMUNITY)
Admit: 2023-07-29 | Discharge: 2023-07-29 | Disposition: A | Discharge status: Home / Self Care | Attending: Internal Medicine

## 2023-07-29 DIAGNOSIS — R569 Unspecified convulsions: Secondary | ICD-10-CM | POA: Diagnosis not present

## 2023-07-29 DIAGNOSIS — G309 Alzheimer's disease, unspecified: Secondary | ICD-10-CM

## 2023-07-29 DIAGNOSIS — E872 Acidosis, unspecified: Secondary | ICD-10-CM | POA: Diagnosis present

## 2023-07-29 DIAGNOSIS — H1031 Unspecified acute conjunctivitis, right eye: Secondary | ICD-10-CM | POA: Diagnosis present

## 2023-07-29 DIAGNOSIS — R0902 Hypoxemia: Secondary | ICD-10-CM | POA: Diagnosis present

## 2023-07-29 DIAGNOSIS — G9349 Other encephalopathy: Secondary | ICD-10-CM

## 2023-07-29 DIAGNOSIS — F0280 Dementia in other diseases classified elsewhere without behavioral disturbance: Secondary | ICD-10-CM | POA: Diagnosis not present

## 2023-07-29 DIAGNOSIS — N179 Acute kidney failure, unspecified: Secondary | ICD-10-CM | POA: Diagnosis present

## 2023-07-29 DIAGNOSIS — E876 Hypokalemia: Secondary | ICD-10-CM | POA: Diagnosis present

## 2023-07-29 DIAGNOSIS — R4182 Altered mental status, unspecified: Secondary | ICD-10-CM | POA: Diagnosis not present

## 2023-07-29 DIAGNOSIS — Z950 Presence of cardiac pacemaker: Secondary | ICD-10-CM | POA: Diagnosis not present

## 2023-07-29 DIAGNOSIS — G9341 Metabolic encephalopathy: Secondary | ICD-10-CM

## 2023-07-29 DIAGNOSIS — E860 Dehydration: Secondary | ICD-10-CM

## 2023-07-29 DIAGNOSIS — G40909 Epilepsy, unspecified, not intractable, without status epilepticus: Secondary | ICD-10-CM | POA: Diagnosis present

## 2023-07-29 DIAGNOSIS — E782 Mixed hyperlipidemia: Secondary | ICD-10-CM

## 2023-07-29 DIAGNOSIS — F03918 Unspecified dementia, unspecified severity, with other behavioral disturbance: Secondary | ICD-10-CM

## 2023-07-29 DIAGNOSIS — I482 Chronic atrial fibrillation, unspecified: Secondary | ICD-10-CM

## 2023-07-29 DIAGNOSIS — Z6821 Body mass index (BMI) 21.0-21.9, adult: Secondary | ICD-10-CM | POA: Diagnosis not present

## 2023-07-29 DIAGNOSIS — Z66 Do not resuscitate: Secondary | ICD-10-CM | POA: Diagnosis not present

## 2023-07-29 DIAGNOSIS — R1312 Dysphagia, oropharyngeal phase: Secondary | ICD-10-CM | POA: Diagnosis not present

## 2023-07-29 DIAGNOSIS — R279 Unspecified lack of coordination: Secondary | ICD-10-CM | POA: Diagnosis not present

## 2023-07-29 DIAGNOSIS — Z87891 Personal history of nicotine dependence: Secondary | ICD-10-CM | POA: Diagnosis not present

## 2023-07-29 DIAGNOSIS — Z7901 Long term (current) use of anticoagulants: Secondary | ICD-10-CM | POA: Diagnosis not present

## 2023-07-29 DIAGNOSIS — D696 Thrombocytopenia, unspecified: Secondary | ICD-10-CM | POA: Diagnosis present

## 2023-07-29 DIAGNOSIS — E43 Unspecified severe protein-calorie malnutrition: Secondary | ICD-10-CM | POA: Diagnosis present

## 2023-07-29 DIAGNOSIS — F02818 Dementia in other diseases classified elsewhere, unspecified severity, with other behavioral disturbance: Secondary | ICD-10-CM | POA: Diagnosis present

## 2023-07-29 DIAGNOSIS — M6281 Muscle weakness (generalized): Secondary | ICD-10-CM | POA: Diagnosis not present

## 2023-07-29 DIAGNOSIS — R41841 Cognitive communication deficit: Secondary | ICD-10-CM | POA: Diagnosis not present

## 2023-07-29 DIAGNOSIS — G8929 Other chronic pain: Secondary | ICD-10-CM | POA: Diagnosis present

## 2023-07-29 LAB — CBC
HCT: 38.5 % — ABNORMAL LOW (ref 39.0–52.0)
Hemoglobin: 13 g/dL (ref 13.0–17.0)
MCH: 29.8 pg (ref 26.0–34.0)
MCHC: 33.8 g/dL (ref 30.0–36.0)
MCV: 88.3 fL (ref 80.0–100.0)
Platelets: 117 10*3/uL — ABNORMAL LOW (ref 150–400)
RBC: 4.36 MIL/uL (ref 4.22–5.81)
RDW: 12.3 % (ref 11.5–15.5)
WBC: 5.2 10*3/uL (ref 4.0–10.5)
nRBC: 0 % (ref 0.0–0.2)

## 2023-07-29 LAB — COMPREHENSIVE METABOLIC PANEL WITH GFR
ALT: 50 U/L — ABNORMAL HIGH (ref 0–44)
AST: 46 U/L — ABNORMAL HIGH (ref 15–41)
Albumin: 3.5 g/dL (ref 3.5–5.0)
Alkaline Phosphatase: 57 U/L (ref 38–126)
Anion gap: 10 (ref 5–15)
BUN: 15 mg/dL (ref 8–23)
CO2: 27 mmol/L (ref 22–32)
Calcium: 8.6 mg/dL — ABNORMAL LOW (ref 8.9–10.3)
Chloride: 103 mmol/L (ref 98–111)
Creatinine, Ser: 0.99 mg/dL (ref 0.61–1.24)
GFR, Estimated: >60 mL/min (ref >60)
Glucose, Bld: 99 mg/dL (ref 70–99)
Potassium: 4 mmol/L (ref 3.5–5.1)
Sodium: 140 mmol/L (ref 135–145)
Total Bilirubin: 1.2 mg/dL (ref 0.0–1.2)
Total Protein: 5.9 g/dL — ABNORMAL LOW (ref 6.5–8.1)

## 2023-07-29 LAB — LACTIC ACID, PLASMA
Lactic Acid, Venous: 3 mmol/L (ref 0.5–1.9)
Lactic Acid, Venous: 1.4 mmol/L (ref 0.5–1.9)

## 2023-07-29 LAB — PHOSPHORUS: Phosphorus: 3.1 mg/dL (ref 2.5–4.6)

## 2023-07-29 LAB — MAGNESIUM: Magnesium: 1.9 mg/dL (ref 1.7–2.4)

## 2023-07-29 MED ORDER — APIXABAN 5 MG PO TABS
5.0000 mg | ORAL_TABLET | Freq: Two times a day (BID) | ORAL | Status: DC
  Administered 2023-07-29 – 2023-08-04 (×13): 5 mg via ORAL
  Filled 2023-07-29 (×13): qty 1

## 2023-07-29 MED ORDER — LEVETIRACETAM 500 MG PO TABS
1000.0000 mg | ORAL_TABLET | Freq: Two times a day (BID) | ORAL | Status: DC
  Administered 2023-07-29 – 2023-08-04 (×13): 1000 mg via ORAL
  Filled 2023-07-29 (×13): qty 2

## 2023-07-29 MED ORDER — ONDANSETRON HCL 4 MG/2ML IJ SOLN: 4.0000 mg | Freq: Four times a day (QID) | INTRAMUSCULAR | Status: DC | PRN

## 2023-07-29 MED ORDER — DONEPEZIL HCL 5 MG PO TABS
10.0000 mg | ORAL_TABLET | Freq: Every day | ORAL | Status: DC
  Administered 2023-07-29 – 2023-08-03 (×6): 10 mg via ORAL
  Filled 2023-07-29 (×6): qty 2

## 2023-07-29 MED ORDER — ROSUVASTATIN CALCIUM 20 MG PO TABS
20.0000 mg | ORAL_TABLET | Freq: Every morning | ORAL | Status: DC
  Administered 2023-07-29 – 2023-08-04 (×7): 20 mg via ORAL
  Filled 2023-07-29 (×7): qty 1

## 2023-07-29 MED ORDER — HALOPERIDOL 0.5 MG PO TABS
1.0000 mg | ORAL_TABLET | Freq: Four times a day (QID) | ORAL | Status: DC | PRN
  Administered 2023-07-29 – 2023-07-30 (×4): 1 mg via ORAL
  Filled 2023-07-29 (×4): qty 2

## 2023-07-29 MED ORDER — MEMANTINE HCL 10 MG PO TABS
10.0000 mg | ORAL_TABLET | Freq: Two times a day (BID) | ORAL | Status: DC
  Administered 2023-07-29 – 2023-08-04 (×13): 10 mg via ORAL
  Filled 2023-07-29 (×12): qty 1

## 2023-07-29 MED ORDER — LACTATED RINGERS IV SOLN: INTRAVENOUS | Status: AC

## 2023-07-29 MED ORDER — DEXTROSE-SODIUM CHLORIDE 5-0.9 % IV SOLN: INTRAVENOUS | Status: AC

## 2023-07-29 MED ORDER — ACETAMINOPHEN 650 MG RE SUPP: 650.0000 mg | Freq: Four times a day (QID) | RECTAL | Status: DC | PRN

## 2023-07-29 MED ORDER — HALOPERIDOL LACTATE 5 MG/ML IJ SOLN
1.0000 mg | Freq: Four times a day (QID) | INTRAMUSCULAR | Status: DC | PRN
  Administered 2023-07-31: 1 mg via INTRAMUSCULAR
  Filled 2023-07-29 (×2): qty 1

## 2023-07-29 MED ORDER — ONDANSETRON HCL 4 MG PO TABS: 4.0000 mg | ORAL_TABLET | Freq: Four times a day (QID) | ORAL | Status: DC | PRN

## 2023-07-29 MED ORDER — ADULT MULTIVITAMIN W/MINERALS CH
1.0000 | ORAL_TABLET | Freq: Every day | ORAL | Status: DC
  Administered 2023-07-29 – 2023-08-04 (×7): 1 via ORAL
  Filled 2023-07-29 (×7): qty 1

## 2023-07-29 MED ORDER — ACETAMINOPHEN 325 MG PO TABS
650.0000 mg | ORAL_TABLET | Freq: Four times a day (QID) | ORAL | Status: DC | PRN
  Administered 2023-07-31 – 2023-08-03 (×2): 650 mg via ORAL
  Filled 2023-07-29 (×3): qty 2

## 2023-07-29 MED ORDER — THIAMINE MONONITRATE 100 MG PO TABS
500.0000 mg | ORAL_TABLET | Freq: Every day | ORAL | Status: DC
  Administered 2023-07-29 – 2023-08-04 (×7): 500 mg via ORAL
  Filled 2023-07-29 (×7): qty 5

## 2023-07-29 NOTE — Assessment & Plan Note (Signed)
 Continue anticoagulation with apixaban  Patient continue atrial paced rhythm.

## 2023-07-29 NOTE — ED Notes (Signed)
 Dr Elyse Hand at bedside, reports finding pt in corner of room staring at the wall with urine on the ground. Pt reoriented and directed back to bed. Pt responsive to verbal stimuli and able to state his name correctly. Pt slow to respond and oriented x1. Pt placed in clean gown and repositioned in bed. Warm blanket provided, TV turned on for distraction, and call bell within reach.

## 2023-07-29 NOTE — Progress Notes (Signed)
 EEG complete - results pending

## 2023-07-29 NOTE — Assessment & Plan Note (Signed)
 Continue with statin therapy.  ?

## 2023-07-29 NOTE — Assessment & Plan Note (Addendum)
 Continue neuro checks per unit protocol Continue donepezil  and memantine 

## 2023-07-29 NOTE — Hospital Course (Signed)
 Jonathan Poole was admitted to the hospital with the working diagnosis of acute metabolic encephalopathy   68 yo male with the past medical history of hyperlipidemia, atrial fibrillation, seizures, and dementia who presented with altered mental status. At home patient was noted to have decreased responsiveness, not at his baseline. Acute change around midnight on the day of admission, his wife called EMS. He was found sitting in room, rigid and unable to stand, noted to have staring spells and he was not verbal. His 02 saturation was 89% on room air. He was placed on 2 L/ min per [place] and was transported to the ED.  On his initial physical examination he was not able to provide any history due to cognitive impairment, his blood pressure was 109/74, HR 76, RR 12 and 02 saturation 93% Lungs with no wheezing or rhonchi, no rales, heart with S1 and S2 present and regular, with no gallops or rubs, abdomen with no distention and no lower extremity edema. He was non focal.   Na 138, K 4,4 CL 99, bicarbonate 24 glucose 122, bun 19 cr 1,29  AST 41 ALT 60  Lactic acid 3,0, 2,8 and 1,4  Wbc 5.2 hgb 13.0 plt 117  Urine analysis SG 1,020, protein 30, leukocytes negative, hgb large, 0-5 wbc, > 50 rbc.  Toxicology screen negative   CT head with no evidence of acute intracranial abnormalities Atrophy and chronic small vessel ischemic changes of the white matter.   Chest radiograph with hypoinflation, left base atelectasis, with pacemaker in place with one right artrial and one right ventricular lead. No cardiomegaly.   EKG 74 bpm, normal axis, normal intervals, qtc 482, atrial paced rhythm with ventricular sensed, with no significant ST segment or T wave changes.   06/19 patient continue confused and disorientated. Poor oral intake and refusing to take po meds. Requiring sitter one to one for safety.

## 2023-07-29 NOTE — Assessment & Plan Note (Signed)
 EEG with moderate diffuse encephalopathy with no seizures or epileptiform discharges throughout the recordings.  Check keppra  level  Continue oral keppra  and neuro checks.  Can not rule out seizure at home.

## 2023-07-29 NOTE — Assessment & Plan Note (Addendum)
 Dehydration, hypokalemia    Today renal function with serum cr at 0,82 with K at 3,4 and serum bicarbonate at 25  Na 137   Lactic acidosis has resolved.  Continue close follow up renal function and electrolytes.  Continue IV fluids with isotonic saline with dextrose .  Add 40 meq KCl x 2 doses

## 2023-07-29 NOTE — Progress Notes (Signed)
 Progress Note   Patient: Jonathan Poole ZOX:[id number] DOB: 1955-06-09 DOA: 07/28/2023     0 DOS: the patient was seen and examined on 07/29/2023   Brief hospital course: Jonathan Poole was admitted to the hospital with the working diagnosis of acute metabolic encephalopathy   68 yo male with the past medical history of hyperlipidemia, atrial fibrillation, seizures, and dementia who presented with altered mental status. At home patient was noted to have decreased responsiveness, not at his baseline. Acute change around midnight on the day of admission, his wife called EMS. He was found sitting in room, rigid and unable to stand, noted to have staring spells and he was not verbal. His 02 saturation was 89% on room air. He was placed on 2 L/ min per [place] and was transported to the ED.  On his initial physical examination he was not able to provide any history due to cognitive impairment, his blood pressure was 109/74, HR 76, RR 12 and 02 saturation 93% Lungs with no wheezing or rhonchi, no rales, heart with S1 and S2 present and regular, with no gallops or rubs, abdomen with no distention and no lower extremity edema. He was non focal.   Na 138, K 4,4 CL 99, bicarbonate 24 glucose 122, bun 19 cr 1,29  AST 41 ALT 60  Lactic acid 3,0, 2,8 and 1,4  Wbc 5.2 hgb 13.0 plt 117  Urine analysis SG 1,020, protein 30, leukocytes negative, hgb large, 0-5 wbc, > 50 rbc.  Toxicology screen negative   CT head with no evidence of acute intracranial abnormalities Atrophy and chronic small vessel ischemic changes of the white matter.   Chest radiograph with hypoinflation, left base atelectasis, with pacemaker in place with one right artrial and one right ventricular lead. No cardiomegaly.   EKG 74 bpm, normal axis, normal intervals, qtc 482, atrial paced rhythm with ventricular sensed, with no significant ST segment or T wave changes.    Assessment and Plan: * Acute metabolic encephalopathy Patient today is  more awake and reactive, I suspect he has advance dementia Will contact his family to check his baseline Continue neuro checks per unit protocol He has been agitated at time and has received haldol  with no significant improvement.  Will add sitter to help redirection patient Continue supportive medical therapy.  Add thiamine , multivitamins and consult nutrition   AKI (acute kidney injury) (HCC) Dehydration  Continue with IV fluids, balance electrolyte solutions.  Renal function improved with serum cr at 0,99, K is 4,0 and serum bicarbonate at 27  Na 140 Mg 1,9   Lactic acidosis has resolved.  Continue close follow up renal f unction and electrolytes.   Alzheimer's disease (HCC) Continue neuro checks per unit protocol Continue donepezil  and memantine    Seizure (HCC) Continue keppra  and follow up with EEG   Chronic atrial fibrillation (HCC) Continue anticoagulation with apixaban  Patient continue atrial paced rhythm.   Mixed hyperlipidemia Continue with statin therapy       Subjective: patient has been agitated this morning but is easy to re direct, he dose not recall what happened at home,. Denies any chest pain or dyspnea, no nausea or vomiting   Physical Exam: Vitals:   07/29/23 0225 07/29/23 0300 07/29/23 0330 07/29/23 0737  BP:  101/70 109/74 (!) 153/70  Pulse: 72 77 76 70  Resp: 11 13 12 16   Temp:   98.2 F (36.8 C) 97.8 F (36.6 C)  TempSrc:   Oral Oral  SpO2: 93% 92% 93%  92%  Weight:   71.2 kg    Neurology awake and alert, he is calm at the time of my examination, responds to questions and follows commands, strength is preserved, he does have significant cognitive impairment.  ENT with mild pallor with no icterus, oral mucosa dry  Cardiovascular with S1 and S2 present and regular with no gallops, rubs or murmurs Respiratory with no rales or wheezing, no rhonchi  Abdomen with no distention  No lower extremity edema No evident rashes   Data  Reviewed:    Family Communication: no family at the bedside. I was not able to reach his wife over the phone    Disposition: Status is: Inpatient Remains inpatient appropriate because: IV fluids and neurologic monitoring. Work up   Planned Discharge Destination: Home    Author: Albertus Alt, MD 07/29/2023 1:14 PM  For on call review [email or web address].

## 2023-07-29 NOTE — Plan of Care (Signed)
  Problem: Education: Goal: Knowledge of General Education information will improve Description: Including pain rating scale, medication(s)/side effects and non-pharmacologic comfort measures Outcome: Not Progressing   Problem: Health Behavior/Discharge Planning: Goal: Ability to manage health-related needs will improve Outcome: Not Progressing   Problem: Clinical Measurements: Goal: Ability to maintain clinical measurements within normal limits will improve Outcome: Progressing Goal: Will remain free from infection Outcome: Progressing Goal: Diagnostic test results will improve Outcome: Progressing Goal: Respiratory complications will improve Outcome: Progressing Goal: Cardiovascular complication will be avoided Outcome: Progressing   Problem: Activity: Goal: Risk for activity intolerance will decrease Outcome: Not Progressing   Problem: Nutrition: Goal: Adequate nutrition will be maintained Outcome: Not Progressing   Problem: Coping: Goal: Level of anxiety will decrease Outcome: Not Progressing   Problem: Elimination: Goal: Will not experience complications related to bowel motility Outcome: Progressing Goal: Will not experience complications related to urinary retention Outcome: Progressing   Problem: Pain Managment: Goal: General experience of comfort will improve and/or be controlled Outcome: Progressing   Problem: Safety: Goal: Ability to remain free from injury will improve Outcome: Not Progressing   Problem: Skin Integrity: Goal: Risk for impaired skin integrity will decrease Outcome: Progressing

## 2023-07-29 NOTE — Procedures (Signed)
 Patient Name: Jonathan Poole  MRN: [id number]  Epilepsy Attending: Arleene Lack  Referring Physician/Provider: Adefeso, Oladapo, DO  Date: 07/29/2023 Duration: 22.41 mins  Patient history: 68yo M with ams. EEG to evaluate for seizure  Level of alertness: Awake  AEDs during EEG study: LEV  Technical aspects: This EEG study was done with scalp electrodes positioned according to the 10-20 International system of electrode placement. Electrical activity was reviewed with band pass filter of 1-70Hz , sensitivity of 7 uV/mm, display speed of 84mm/sec with a 60Hz  notched filter applied as appropriate. EEG data were recorded continuously and digitally stored.  Video monitoring was available and reviewed as appropriate.  Description: EEG showed continuous generalized 3 to 6 Hz theta-delta slowing. Hyperventilation and photic stimulation were not performed.     ABNORMALITY - Continuous slow, generalized  IMPRESSION: This study is suggestive of moderate diffuse encephalopathy. No seizures or epileptiform discharges were seen throughout the recording.  [name] O Jonesha Tsuchiya

## 2023-07-29 NOTE — Assessment & Plan Note (Addendum)
 Patient today is more awake and reactive, I suspect he has advanced dementia  Continue neuro checks per unit protocol Very poor oral intake and declining to take some meds po.  Supportive medical therapy with IV fluids and as needed haldol .  Continue sitter one to one    On high dose thiamine , multivitamins and nutritional supplements.

## 2023-07-29 NOTE — H&P (Signed)
 History and Physical    Patient: Jonathan Poole:[id number] DOB: [birth year] DOA: 07/28/2023 DOS: the patient was seen and examined on 07/29/2023 PCP: Omie Bickers, MD  Patient coming from: Home  Chief Complaint:  Chief Complaint  Patient presents with   Altered Mental Status   HPI: Jonathan Poole is a 68 y.o. male with medical history significant of hyperlipidemia, atrial fibrillation, dementia, seizures who presents to the emergency department from home via EMS due to altered mental status.  Patient was unable to provide history possibly due to underlying dementia, history was obtained from ED physician and ED medical record.  Per report, he was reported to have had decreased responsiveness since yesterday night and was noted to be more somnolent.  At bedside, patient was at bedside facing the wall and was only oriented x 1 (person).  He was redirected back to bed.  There was no report of fall, fever, seizures, nausea or vomiting.  ED Course:  In the emergency department, respiratory rate on arrival to the ED was 9/min, but this subsequently improved to 11 to 15/min.  VBG was normal except for bicarb of 32.2 BP 94/57, but other vital signs are within normal range.  Workup in the ED showed normal CBC except for platelets of 142.  BMP was normal except for blood glucose of 122 and creatinine of 1.29.  ALT 60.  Lactic acid 3.0 > 2.8.  Urinalysis was normal.  Ammonia 32. CT head without contrast showed no acute intracranial abnormality Chest x-ray showed no acute cardiopulmonary findings IV hydration was provided.  TRH was asked to admit patient  Review of Systems: Review of systems as noted in the HPI. All other systems reviewed and are negative.   Past Medical History:  Diagnosis Date   Atrial fibrillation (HCC)    Dementia (HCC)    Heart disease    Hyperchloremia    Seizures (HCC)    Past Surgical History:  Procedure Laterality Date   PACEMAKER IMPLANT     PPM GENERATOR  CHANGEOUT N/A 01/02/2020   Procedure: PPM GENERATOR CHANGEOUT;  Surgeon: Tammie Fall, MD;  Location: [facility];  Service: Cardiovascular;  Laterality: N/A;    Social History:  reports that he quit smoking about 24 years ago. His smoking use included cigarettes. He started smoking about 39 years ago. He has a 15 pack-year smoking history. He has been exposed to tobacco smoke. He has never used smokeless tobacco. He reports that he does not currently use drugs after having used the following drugs: Marijuana. He reports that he does not drink alcohol.   No Known Allergies  Family History  Problem Relation Age of Onset   Heart disease Father    Prostate cancer Paternal Grandfather       Prior to Admission medications   Medication Sig Start Date End Date Taking? Authorizing Provider  acetaminophen  (TYLENOL ) 500 MG tablet Take 500-1,000 mg by mouth every 6 (six) hours as needed (when not taking Ibuprofen).   Yes [provider]  apixaban  (ELIQUIS ) 5 MG TABS tablet Take 1 tablet (5 mg total) by mouth 2 (two) times daily. 05/18/23  Yes Tammie Fall, MD  Buprenorphine  HCl-Naloxone  HCl 8-2 MG FILM Place 1 Film under the tongue 3 (three) times daily. 02/21/23  Yes [provider]  cyanocobalamin  (VITAMIN B12) 1000 MCG/ML injection Inject 1,000 mcg into the muscle every 30 (thirty) days. 03/28/23  Yes [provider]  donepezil  (ARICEPT ) 10 MG tablet  TAKE 1 TABLET AT BEDTIME Patient taking differently: Take 10 mg by mouth in the morning and at bedtime. 06/16/22  Yes Meldon Sport, MD  gabapentin  (NEURONTIN ) 800 MG tablet Take 800 mg by mouth 4 (four) times daily. 06/02/23  Yes [provider]  Ibuprofen 200 MG CAPS Take 800 mg by mouth every 6 (six) hours as needed (for pain- when not taking Tylenol ).   Yes [provider]  levETIRAcetam  (KEPPRA ) 1000 MG tablet Take 1 tablet (1,000 mg total) by mouth 2 (two) times daily. 06/05/23  Yes Wynetta Heckle, MD  memantine  (NAMENDA ) 10 MG tablet Take 10 mg by mouth in the morning and at bedtime. 05/09/23  Yes [provider]  naloxone  (NARCAN ) nasal spray 4 mg/0.1 mL Place 1 spray into the nose once as needed (OD). 01/28/22  Yes [provider]  QUEtiapine (SEROQUEL) 50 MG tablet Take 50 mg by mouth 3 (three) times daily as needed (mood).   Yes [provider]  REXULTI  3 MG TABS Take 3 mg by mouth in the morning.   Yes [provider]  rosuvastatin  (CRESTOR ) 20 MG tablet TAKE 1 TABLET ONE TIME DAILY Patient taking differently: Take 20 mg by mouth in the morning. 02/26/22  Yes Meldon Sport, MD  tiZANidine  (ZANAFLEX ) 4 MG tablet Take 1 tablet (4 mg total) by mouth 3 (three) times daily. Patient taking differently: Take 4 mg by mouth every 6 (six) hours as needed for muscle spasms. 07/31/21  Yes Zarwolo, Gloria, FNP    Physical Exam: BP 109/74 (BP Location: Right Arm)   Pulse 76   Temp 98.2 F (36.8 C) (Oral)   Resp 12   SpO2 93%   General: 68 y.o. year-old male appearing, but in no acute distress.  Alert and oriented x 1. HEENT: NCAT, EOMI, dry mucous membrane Neck: Supple, trachea medial Cardiovascular: Regular rate and rhythm with no rubs or gallops.  No thyromegaly or JVD noted.  No lower extremity edema. 2/4 pulses in all 4 extremities. Respiratory: Clear to auscultation with no wheezes or rales. Good inspiratory effort. Abdomen: Soft, nontender nondistended with normal bowel sounds x4 quadrants. Muskuloskeletal: No cyanosis, clubbing or edema noted bilaterally Neuro: No focal neurologic deficit, strength 5/5 x 4, sensation, reflexes intact Skin: Decreased skin turgor, no ulcerative lesions noted or rashes Psychiatry: Mood is appropriate for condition and setting          Labs on Admission:  Basic Metabolic Panel: Recent Labs  Lab 07/28/23 1833  NA 138  K 4.4  CL 99  CO2 24  GLUCOSE 122*  BUN 19  CREATININE 1.29*  CALCIUM  9.3   Liver  Function Tests: Recent Labs  Lab 07/28/23 1833  AST 41  ALT 60*  ALKPHOS 66  BILITOT 1.5*  PROT 6.8  ALBUMIN 4.1   No results for input(s): LIPASE, AMYLASE in the last 168 hours. Recent Labs  Lab 07/28/23 1921  AMMONIA 32   CBC: Recent Labs  Lab 07/28/23 1833  WBC 6.1  HGB 14.1  HCT 41.4  MCV 87.3  PLT 142*   Cardiac Enzymes: No results for input(s): CKTOTAL, CKMB, CKMBINDEX, TROPONINI in the last 168 hours.  BNP (last 3 results) No results for input(s): BNP in the last 8760 hours.  ProBNP (last 3 results) No results for input(s): PROBNP in the last 8760 hours.  CBG: Recent Labs  Lab 07/28/23 1944  GLUCAP 116*    Radiological Exams on Admission: CT Head Wo Contrast  Result Date: 07/28/2023 CLINICAL DATA:  Mental status change EXAM: CT HEAD WITHOUT CONTRAST TECHNIQUE: Contiguous axial images were obtained from the base of the skull through the vertex without intravenous contrast. RADIATION DOSE REDUCTION: This exam was performed according to the departmental dose-optimization program which includes automated exposure control, adjustment of the mA and/or kV according to patient size and/or use of iterative reconstruction technique. COMPARISON:  CT 06/09/2023 FINDINGS: Brain: No acute territorial infarction, hemorrhage or intracranial mass. Atrophy and chronic small vessel ischemic changes of the white matter. The ventricles are nonenlarged. Vascular: No hyperdense vessel or unexpected calcification. Skull: Normal. Negative for fracture or focal lesion. Sinuses/Orbits: No acute finding. Other: None IMPRESSION: 1. No CT evidence for acute intracranial abnormality. 2. Atrophy and chronic small vessel ischemic changes of the white matter. Electronically Signed   By: Esmeralda Hedge M.D.   On: 07/28/2023 19:59   DG Chest Portable 1 View Result Date: 07/28/2023 CLINICAL DATA:  Dyspnea. EXAM: PORTABLE CHEST 1 VIEW COMPARISON:  None Available. FINDINGS: Low lung  volumes. The heart size and mediastinal contours are within normal limits. 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 No focal consolidation, sizeable pleural effusion, or pneumothorax. No acute osseous abnormality. IMPRESSION: Low lung volumes.  No acute cardiopulmonary findings. Electronically Signed   By: Mannie Seek M.D.   On: 07/28/2023 18:58    EKG: I independently viewed the EKG done and my findings are as followed: Normal sinus rhythm at a rate of 74 bpm  Assessment/Plan Present on Admission:  Acute metabolic encephalopathy  Mixed hyperlipidemia  Dementia with behavioral disturbance (HCC)  Principal Problem:   Acute metabolic encephalopathy Active Problems:   Mixed hyperlipidemia   Dementia with behavioral disturbance (HCC)   Dehydration   Lactic acidosis   Seizure (HCC)  Acute metabolic encephalopathy No known cause of altered mental status at this time CT head without contrast showed no acute intracranial abnormality CNS acting drugs will be temporarily held at this time Absent seizures appears to be mentioned by EMS team, EEG will be done in the morning Continue fall precaution and delirium precautions Consider neurology consult for worsening symptoms  Dehydration Continue IV hydration  Lactic acidosis Lactic acid 3.0 > 2.8 Continue to trend lactic acid  Mixed hyperlipidemia Continue Crestor   Dementia Continue Namenda  and Aricept   Seizures Continue Keppra   DVT prophylaxis: Eliquis   Code Status: Full code  Family Communication: None at bedside  Consults: None  Severity of Illness: The appropriate patient status for this patient is INPATIENT. Inpatient status is judged to be reasonable and necessary in order to provide the required intensity of service to ensure the patient's safety. The patient's presenting symptoms, physical exam findings, and initial radiographic and laboratory data in the context of their chronic comorbidities is felt to  place them at high risk for further clinical deterioration. Furthermore, it is not anticipated that the patient will be medically stable for discharge from the hospital within 2 midnights of admission.   * I certify that at the point of admission it is my clinical judgment that the patient will require inpatient hospital care spanning beyond 2 midnights from the point of admission due to high intensity of service, high risk for further deterioration and high frequency of surveillance required.*  Author: Tc Kapusta, DO 07/29/2023 4:05 AM  For on call review [email or web address].

## 2023-07-29 NOTE — Progress Notes (Signed)
 Mobility Specialist Progress Note:    07/29/23 1140  Mobility  Activity Stood at bedside;Dangled on edge of bed  Level of Assistance Contact guard assist, steadying assist  Assistive Device None  Range of Motion/Exercises Active;All extremities  Activity Response Tolerated well  Mobility Referral No  Mobility visit 1 Mobility  Mobility Specialist Start Time (ACUTE ONLY) 1128  Mobility Specialist Stop Time (ACUTE ONLY) 1140  Mobility Specialist Time Calculation (min) (ACUTE ONLY) 12 min   Pt received attempting to get OOB without assistance. Required CGA to stand. Tolerated well, requires many verbal cues. Returned supine, alarm on all needs met.   Glinda Lapping Mobility Specialist Please contact via Special educational needs teacher or  Rehab office at [phone]

## 2023-07-29 NOTE — Progress Notes (Signed)
 Transition of Care Department [facility]) has reviewed patient and no other TOC needs have been identified at this time. We will continue to monitor patient advancement through interdisciplinary progression rounds. If new patient transition needs arise, please place a TOC consult.    07/29/23 0759  TOC Brief Assessment  Insurance and Status Reviewed  Patient has primary care physician Yes  Home environment has been reviewed Lives with wife and son.  Prior level of function: Fairly independent at baseline.  Prior/Current Home Services No current home services  Social Drivers of Health Review SDOH reviewed no interventions necessary  Readmission risk has been reviewed Yes  Transition of care needs no transition of care needs at this time

## 2023-07-30 DIAGNOSIS — G9341 Metabolic encephalopathy: Secondary | ICD-10-CM | POA: Diagnosis not present

## 2023-07-30 DIAGNOSIS — E43 Unspecified severe protein-calorie malnutrition: Secondary | ICD-10-CM

## 2023-07-30 DIAGNOSIS — N179 Acute kidney failure, unspecified: Secondary | ICD-10-CM | POA: Diagnosis not present

## 2023-07-30 DIAGNOSIS — H1031 Unspecified acute conjunctivitis, right eye: Secondary | ICD-10-CM

## 2023-07-30 DIAGNOSIS — G309 Alzheimer's disease, unspecified: Secondary | ICD-10-CM | POA: Diagnosis not present

## 2023-07-30 DIAGNOSIS — R569 Unspecified convulsions: Secondary | ICD-10-CM | POA: Diagnosis not present

## 2023-07-30 LAB — BASIC METABOLIC PANEL WITH GFR
Anion gap: 11 (ref 5–15)
BUN: 8 mg/dL (ref 8–23)
CO2: 25 mmol/L (ref 22–32)
Calcium: 8.5 mg/dL — ABNORMAL LOW (ref 8.9–10.3)
Chloride: 101 mmol/L (ref 98–111)
Creatinine, Ser: 0.82 mg/dL (ref 0.61–1.24)
GFR, Estimated: >60 mL/min (ref >60)
Glucose, Bld: 119 mg/dL — ABNORMAL HIGH (ref 70–99)
Potassium: 3.4 mmol/L — ABNORMAL LOW (ref 3.5–5.1)
Sodium: 137 mmol/L (ref 135–145)

## 2023-07-30 LAB — CBC
HCT: 39.8 % (ref 39.0–52.0)
Hemoglobin: 13.6 g/dL (ref 13.0–17.0)
MCH: 29.7 pg (ref 26.0–34.0)
MCHC: 34.2 g/dL (ref 30.0–36.0)
MCV: 86.9 fL (ref 80.0–100.0)
Platelets: 139 10*3/uL — ABNORMAL LOW (ref 150–400)
RBC: 4.58 MIL/uL (ref 4.22–5.81)
RDW: 12.3 % (ref 11.5–15.5)
WBC: 5.6 10*3/uL (ref 4.0–10.5)
nRBC: 0 % (ref 0.0–0.2)

## 2023-07-30 MED ORDER — POTASSIUM CHLORIDE CRYS ER 20 MEQ PO TBCR
40.0000 meq | EXTENDED_RELEASE_TABLET | ORAL | Status: AC
  Administered 2023-07-30: 40 meq via ORAL
  Filled 2023-07-30: qty 2

## 2023-07-30 MED ORDER — ENSURE PLUS HIGH PROTEIN PO LIQD
237.0000 mL | Freq: Three times a day (TID) | ORAL | Status: DC
  Administered 2023-07-30 – 2023-08-04 (×10): 237 mL via ORAL

## 2023-07-30 MED ORDER — TOBRAMYCIN 0.3 % OP SOLN: 1.0000 [drp] | Freq: Four times a day (QID) | OPHTHALMIC | Status: DC

## 2023-07-30 MED ORDER — POLYVINYL ALCOHOL 1.4 % OP SOLN: 1.0000 [drp] | OPHTHALMIC | Status: DC | PRN

## 2023-07-30 MED ORDER — TOBRAMYCIN 0.3 % OP SOLN
1.0000 [drp] | Freq: Four times a day (QID) | OPHTHALMIC | Status: DC
  Administered 2023-07-30 – 2023-08-04 (×18): 1 [drp] via OPHTHALMIC
  Filled 2023-07-30: qty 5

## 2023-07-30 NOTE — Assessment & Plan Note (Signed)
 Add topical antibiotic

## 2023-07-30 NOTE — Progress Notes (Addendum)
 Progress Note   Jonathan Poole: Jonathan Poole WUJ:[id number] DOB: [birth year] DOA: 07/28/2023     1 DOS: Jonathan Poole was seen and examined on 07/30/2023   Brief hospital course: Jonathan Poole was [facility] to [facility] with Jonathan working diagnosis of acute metabolic encephalopathy   68 yo male with Jonathan past medical history of hyperlipidemia, atrial fibrillation, seizures, and dementia who presented with altered mental status. At home Jonathan Poole was noted to have decreased responsiveness, not at his baseline. Acute change around midnight on Jonathan day of admission, his wife called EMS. Jonathan Poole was found sitting in room, rigid and unable to stand, noted to have staring spells and Jonathan Poole was not verbal. His 02 saturation was 89% on room air. Jonathan Poole was placed on 2 L/ min per [place] and was transported to [facility] ED.  On his initial physical examination Jonathan Poole was not able to provide any history due to cognitive impairment, his blood pressure was 109/74, HR 76, RR 12 and 02 saturation 93% Lungs with no wheezing or rhonchi, no rales, heart with S1 and S2 present and regular, with no gallops or rubs, abdomen with no distention and no lower extremity edema. Jonathan Poole was non focal.   Na 138, K 4,4 CL 99, bicarbonate 24 glucose 122, bun 19 cr 1,29  AST 41 ALT 60  Lactic acid 3,0, 2,8 and 1,4  Wbc 5.2 hgb 13.0 plt 117  Urine analysis SG 1,020, protein 30, leukocytes negative, hgb large, 0-5 wbc, > 50 rbc.  Toxicology screen negative   CT head with no evidence of acute intracranial abnormalities Atrophy and chronic small vessel ischemic changes of Jonathan white matter.   Chest radiograph with hypoinflation, left base atelectasis, with pacemaker in place with one right artrial and one right ventricular lead. No cardiomegaly.   EKG 74 bpm, normal axis, normal intervals, qtc 482, atrial paced rhythm with ventricular sensed, with no significant ST segment or T wave changes.   06/19 Jonathan Poole continue confused and disorientated. Poor oral intake and  refusing to take po meds. Requiring sitter one to one for safety.    Assessment and Plan: * Acute metabolic encephalopathy Jonathan Poole today is more awake and reactive, I suspect Jonathan Poole has advanced dementia  Continue neuro checks per unit protocol Very poor oral intake and declining to take some meds po.  Supportive medical therapy with IV fluids and as needed haldol .  Continue sitter one to one    On high dose thiamine , multivitamins and nutritional supplements.   AKI (acute kidney injury) (HCC) Dehydration, hypokalemia    Today renal function with serum cr at 0,82 with K at 3,4 and serum bicarbonate at 25  Na 137   Lactic acidosis has resolved.  Continue close follow up renal function and electrolytes.  Continue IV fluids with isotonic saline with dextrose .  Add 40 meq KCl x 2 doses   Alzheimer's disease (HCC) Continue neuro checks per unit protocol Continue donepezil  and memantine    Seizure (HCC) EEG with moderate diffuse encephalopathy with no seizures or epileptiform discharges throughout Jonathan recordings.  Check keppra  level  Continue oral keppra  and neuro checks.  Can not rule out seizure at home.   Chronic atrial fibrillation (HCC) Continue anticoagulation with apixaban  Jonathan Poole continue atrial paced rhythm.   Mixed hyperlipidemia Continue with statin therapy   Protein-calorie malnutrition, severe Continue with nutritional supplements.   Acute conjunctivitis of right eye Add topical antibiotic    Subjective: Jonathan Poole continue very confused, Jonathan Poole requires sitter one to one for safety,  Jonathan Poole is difficult to redirect. Very poor oral intake and having difficulty taking medications.   Physical Exam: Vitals:   07/29/23 2002 07/30/23 0424 07/30/23 0749 07/30/23 1430  BP: 132/83 (!) 148/84  136/80  Pulse: 79 76  76  Resp: 16 18  16   Temp: 98.6 F (37 C) 98.4 F (36.9 C)  98.2 F (36.8 C)  TempSrc: Oral Oral  Oral  SpO2: 98% 100%  97%  Weight:      Height:   6' (1.829  m)    Neurology awake and alert confused and disoriented, intermittent agitation, Jonathan Poole responds to simple questions and follows simple commands ENT with mild pallor, oral mucosa dry Cardiovascular with S1 and S2 present and regular with no gallops, rubs or murmurs Respiratory with no rales or wheezing, no rhonchi  Abdomen with no distention, non tender, soft to palpation  No lower extremity edema   Data Reviewed:    Family Communication: no family at Jonathan bedside. I spoke with Jonathan Poole's daughter over Jonathan phone we talked in detail about Jonathan Poole's condition, plan of care and prognosis and all questions were addressed.   Disposition: Status is: Inpatient Remains inpatient appropriate because: IV fluids and neuro checks   Planned Discharge Destination: to be determined     Author: Albertus Alt, MD 07/30/2023 2:35 PM  For on call review [email or web address].

## 2023-07-30 NOTE — Assessment & Plan Note (Signed)
 Continue with nutritional supplements.

## 2023-07-30 NOTE — Progress Notes (Signed)
 Initial Nutrition Assessment  DOCUMENTATION CODES:   Severe malnutrition in context of social or environmental circumstances  INTERVENTION:   Ensure Plus High Protein po TID, each supplement provides 350 kcal and 20 grams of protein Magic cup TID with meals, each supplement provides 290 kcal and 9 grams of protein Continue MVI with minerals daily  NUTRITION DIAGNOSIS:   Severe Malnutrition related to social / environmental circumstances as evidenced by severe muscle depletion, severe fat depletion, percent weight loss (8% weight loss within 3 months).  GOAL:   Patient will meet greater than or equal to 90% of their needs  MONITOR:   PO intake, Supplement acceptance  REASON FOR ASSESSMENT:   Consult Assessment of nutrition requirement/status  ASSESSMENT:   68 yo male admitted with acute metabolic encephalopathy. PMH includes heart disease, A fib, seizures, dementia.  Patient unable to provide any nutrition hx d/t dementia. Spoke with sitter in room who reports patient has been eating and drinking nothing. With a lot of encouragement from sitter and RD, patient took a sip of chocolate Ensure and stated it was very good. Sitter to encourage intake of meals and supplements.  Weight history reviewed. Patient with 8% weight loss within the past 2 months, which is severe.   Patient meets criteria for severe malnutrition, given severe depletion of muscle and subcutaneous fat mass with severe weight loss.  Labs reviewed. K 3.4 Medications reviewed and include keppra , MVI with minerals, klor-con, thiamine . IVF: D5 NS at 75 ml/h  NUTRITION - FOCUSED PHYSICAL EXAM:  Flowsheet Row Most Recent Value  Orbital Region Severe depletion  Upper Arm Region Moderate depletion  Thoracic and Lumbar Region Moderate depletion  Buccal Region Severe depletion  Temple Region Severe depletion  Clavicle Bone Region Moderate depletion  Clavicle and Acromion Bone Region Moderate depletion   Scapular Bone Region Moderate depletion  Dorsal Hand Mild depletion  Patellar Region Severe depletion  Anterior Thigh Region Severe depletion  Posterior Calf Region Severe depletion  Edema (RD Assessment) None  Hair Reviewed  Eyes Reviewed  Mouth Reviewed  Skin Reviewed  Nails Reviewed    Diet Order:   Diet Order             Diet regular Fluid consistency: Thin  Diet effective now                   EDUCATION NEEDS:   Not appropriate for education at this time  Skin:  Skin Assessment: Reviewed RN Assessment  Last BM:  unknown  Height:   Ht Readings from Last 1 Encounters:  07/30/23 6' (1.829 m)    Weight:   Wt Readings from Last 1 Encounters:  07/29/23 71.2 kg    Ideal Body Weight:  80.9 kg  BMI:  Body mass index is 21.29 kg/m.  Estimated Nutritional Needs:   Kcal:  2000-2200  Protein:  100-120 gm  Fluid:  2-2.2 L   Barnet Boots RD, LDN, CNSC Contact via secure chat. If unavailable, use group chat RD Inpatient.

## 2023-07-30 NOTE — Plan of Care (Signed)
   Problem: Nutrition: Goal: Adequate nutrition will be maintained Outcome: Not Progressing

## 2023-07-30 NOTE — Plan of Care (Signed)
  Problem: Nutrition: Goal: Adequate nutrition will be maintained Outcome: Not Progressing   Problem: Coping: Goal: Level of anxiety will decrease Outcome: Progressing   Problem: Safety: Goal: Ability to remain free from injury will improve Outcome: Progressing

## 2023-07-31 DIAGNOSIS — G9341 Metabolic encephalopathy: Secondary | ICD-10-CM | POA: Diagnosis not present

## 2023-07-31 LAB — BASIC METABOLIC PANEL WITH GFR
Anion gap: 10 (ref 5–15)
BUN: <5 mg/dL — ABNORMAL LOW (ref 8–23)
CO2: 25 mmol/L (ref 22–32)
Calcium: 8.5 mg/dL — ABNORMAL LOW (ref 8.9–10.3)
Chloride: 103 mmol/L (ref 98–111)
Creatinine, Ser: 0.71 mg/dL (ref 0.61–1.24)
GFR, Estimated: >60 mL/min
Glucose, Bld: 110 mg/dL — ABNORMAL HIGH (ref 70–99)
Potassium: 3.3 mmol/L — ABNORMAL LOW (ref 3.5–5.1)
Sodium: 138 mmol/L (ref 135–145)

## 2023-07-31 LAB — CULTURE, BLOOD (ROUTINE X 2)

## 2023-07-31 MED ORDER — LORAZEPAM 0.5 MG PO TABS
0.5000 mg | ORAL_TABLET | Freq: Two times a day (BID) | ORAL | Status: DC | PRN
  Administered 2023-07-31 – 2023-08-03 (×5): 0.5 mg via ORAL
  Filled 2023-07-31 (×5): qty 1

## 2023-07-31 MED ORDER — BUPRENORPHINE HCL-NALOXONE HCL 8-2 MG SL SUBL
1.0000 | SUBLINGUAL_TABLET | Freq: Two times a day (BID) | SUBLINGUAL | Status: DC
  Administered 2023-07-31 – 2023-08-04 (×8): 1 via SUBLINGUAL
  Filled 2023-07-31 (×8): qty 1

## 2023-07-31 MED ORDER — POTASSIUM CHLORIDE CRYS ER 20 MEQ PO TBCR
40.0000 meq | EXTENDED_RELEASE_TABLET | ORAL | Status: AC
  Administered 2023-07-31 (×2): 40 meq via ORAL
  Filled 2023-07-31 (×2): qty 2

## 2023-07-31 MED ORDER — MIRTAZAPINE 15 MG PO TABS
15.0000 mg | ORAL_TABLET | Freq: Every day | ORAL | Status: DC
  Administered 2023-07-31 – 2023-08-03 (×4): 15 mg via ORAL
  Filled 2023-07-31 (×4): qty 1

## 2023-07-31 NOTE — Plan of Care (Addendum)
  Problem: Acute Rehab PT Goals(only PT should resolve) Goal: Pt Will Go Supine/Side To Sit Outcome: Progressing Flowsheets (Taken 07/31/2023 1613) Pt will go Supine/Side to Sit: with supervision Goal: Patient Will Transfer Sit To/From Stand Outcome: Progressing Flowsheets (Taken 07/31/2023 1613) Patient will transfer sit to/from stand: with supervision Goal: Pt Will Transfer Bed To Chair/Chair To Bed Outcome: Progressing Flowsheets (Taken 07/31/2023 1613) Pt will Transfer Bed to Chair/Chair to Bed: with supervision Goal: Pt Will Ambulate Outcome: Progressing Flowsheets (Taken 07/31/2023 1613) Pt will Ambulate:  50 feet  with least restrictive assistive device  with supervision   4:13 PM, 07/31/23 Marysue Sola, PT, DPT [place] with [facility]

## 2023-07-31 NOTE — Evaluation (Addendum)
 Physical Therapy Evaluation Patient Details Name: Jonathan Poole MRN: [id number] DOB: [birth year] Today's Date: 07/31/2023  History of Present Illness  Jonathan Poole is a 68 y.o. male with medical history significant of hyperlipidemia, atrial fibrillation, dementia, seizures who presents to the emergency department from home via EMS due to altered mental status.  Patient was unable to provide history possibly due to underlying dementia, history was obtained from ED physician and ED medical record.  Per report, he was reported to have had decreased responsiveness since yesterday night and was noted to be more somnolent.  At bedside, patient was at bedside facing the wall and was only oriented x 1 (person).  He was redirected back to bed.  There was no report of fall, fever, seizures, nausea or vomiting.   Clinical Impression  Patient tolerated PT eval well. Patient received sitting EOB with nursing staff. Nurse tech present throughout. Patient oriented to self only this date and unable to provide history. Required total assist with donning socks. Consistent verbal cueing/instruction throughout in which patient follows inconsistently due to cognitive decline. CGA for STS. Min A for ambulation through HHA x1 with pt demo unsteadiness, 2 LOB to R direction. Max/total assist for sit to supine due to cognition. Inc processing time noted. Patient left in bed with nursing staff present. Patient will benefit from continued skilled physical therapy acutely and in recommended venue in order to address strength, endurance, cognition, and balance to improve function/QOL.        If plan is discharge home, recommend the following: A little help with walking and/or transfers;A little help with bathing/dressing/bathroom;Assistance with cooking/housework;Assistance with feeding;Help with stairs or ramp for entrance;Assist for transportation;Direct supervision/assist for financial management;Direct  supervision/assist for medications management;Supervision due to cognitive status   Can travel by private vehicle        Equipment Recommendations None recommended by PT  Recommendations for Other Services       Functional Status Assessment Patient has had a recent decline in their functional status and demonstrates the ability to make significant improvements in function in a reasonable and predictable amount of time.     Precautions / Restrictions Precautions Precautions: Fall Recall of Precautions/Restrictions: Impaired Restrictions Weight Bearing Restrictions Per Provider Order: No      Mobility  Bed Mobility Overal bed mobility: Needs Assistance Bed Mobility: Sit to Supine       Sit to supine: Mod assist   General bed mobility comments: Mod assist due to pt difficulty with following commands secondary to cognition    Transfers Overall transfer level: Needs assistance Equipment used: 1 person hand held assist Transfers: Sit to/from Stand Sit to Stand: Min assist           General transfer comment: Pt with inc processing time    Ambulation/Gait Ambulation/Gait assistance: Min assist Gait Distance (Feet): 25 Feet Assistive device: 1 person hand held assist Gait Pattern/deviations: Staggering right, Decreased step length - right, Decreased step length - left, Decreased stride length, Shuffle Gait velocity: Dec     General Gait Details: HHAx1, pt losing balance to R throughout requiring min A to recover.  Stairs            Wheelchair Mobility     Tilt Bed    Modified Rankin (Stroke Patients Only)       Balance Overall balance assessment: Needs assistance Sitting-balance support: Feet supported, No upper extremity supported, Bilateral upper extremity supported Sitting balance-Leahy Scale: Good Sitting balance - Comments: Seated EOB  Standing balance support: During functional activity, Single extremity supported Standing balance-Leahy  Scale: Fair Standing balance comment: w/ HHA x1               Pertinent Vitals/Pain Pain Assessment Pain Assessment: Faces Faces Pain Scale: No hurt    Home Living Family/patient expects to be discharged to:: Private residence Living Arrangements: Spouse/significant other Available Help at Discharge: Family;Available 24 hours/day Type of Home: House Home Access: Level entry       Home Layout: One level Home Equipment: None Additional Comments: information provided by patient from previous admission. Pt poor historian due to dementia. no family present to confirm    Prior Function Prior Level of Function : Independent/Modified Independent;Driving             Mobility Comments: Community ambulation without AD, drives, per patient from previous admission. Pt poor historian due to dementia, no family present to confirm ADLs Comments: Independent. Pt poor historian due to dementia. No familiy present to confirm     Extremity/Trunk Assessment   Upper Extremity Assessment Upper Extremity Assessment: Generalized weakness;Overall [facility] for tasks assessed    Lower Extremity Assessment Lower Extremity Assessment: Overall WFL for tasks assessed;Generalized weakness (Difficulty with MMT due to pt inability to follow commands)    Cervical / Trunk Assessment Cervical / Trunk Assessment: Kyphotic  Communication   Communication Communication: No apparent difficulties    Cognition Arousal: Alert Behavior During Therapy: WFL for tasks assessed/performed   PT - Cognitive impairments: History of cognitive impairments         PT - Cognition Comments: Pt only oriented to self this date. Unable to answer any other questions. inc processing time Following commands: Impaired Following commands impaired: Follows one step commands inconsistently     Cueing Cueing Techniques: Verbal cues, Tactile cues, Visual cues     General Comments      Exercises     Assessment/Plan     PT Assessment Patient needs continued PT services;All further PT needs can be met in the next venue of care  PT Problem List Decreased strength;Decreased activity tolerance;Decreased balance;Decreased mobility;Decreased cognition;Decreased safety awareness;Decreased knowledge of precautions       PT Treatment Interventions DME instruction;Gait training;Stair training;Functional mobility training;Therapeutic activities;Therapeutic exercise;Balance training;Patient/family education    PT Goals (Current goals can be found in the Care Plan section)  Acute Rehab PT Goals Patient Stated Goal: Return home following rehab stay PT Goal Formulation: With patient Time For Goal Achievement: 08/14/23 Potential to Achieve Goals: Good    Frequency Min 3X/week     Co-evaluation               AM-PAC PT 6 Clicks Mobility  Outcome Measure Help needed turning from your back to your side while in a flat bed without using bedrails?: A Little Help needed moving from lying on your back to sitting on the side of a flat bed without using bedrails?: A Lot Help needed moving to and from a bed to a chair (including a wheelchair)?: A Little Help needed standing up from a chair using your arms (e.g., wheelchair or bedside chair)?: A Little Help needed to walk in hospital room?: A Little Help needed climbing 3-5 steps with a railing? : A Lot 6 Click Score: 16    End of Session   Activity Tolerance: Patient tolerated treatment well;Other (comment) (Limited due to cognition) Patient left: in bed;with call bell/phone within reach;with nursing/sitter in room Nurse Communication: Mobility status PT Visit Diagnosis: Unsteadiness  on feet (R26.81);Other abnormalities of gait and mobility (R26.89);Muscle weakness (generalized) (M62.81)    Time: 4098-1191 PT Time Calculation (min) (ACUTE ONLY): 13 min   Charges:   PT Evaluation $PT Eval Low Complexity: 1 Low PT Treatments $Therapeutic Activity: 8-22  mins PT General Charges $$ ACUTE PT VISIT: 1 Visit       4:14 PM, 07/31/23 Lillyian Heidt Powell-Butler, PT, DPT [place] with [facility]

## 2023-07-31 NOTE — Progress Notes (Addendum)
  Interdisciplinary Goals of Care Family Meeting   Date carried out: 07/31/2023  Location of the meeting: Phone conference  Member's involved: Physician, Social Worker, and Family Member or next of kin  Durable Power of Attorney or Environmental health practitioner: Patient's wife  Discussion: We discussed goals of care for [employer] .   - Phone conference with patient's wife, social worker Oswaldo Blessing present =-Patient with advanced dementia - As per wife patient has advanced directives stating no feeding tubes and requesting DNR status - - Patient and his wife have been  married since age 9, they do have 3 sons. - Wife is Scientific laboratory technician - She request DNR/DNI status for patient consistent with patient's previously expressed wishes  Code status:   Code Status: Do not attempt resuscitation (DNR) PRE-ARREST INTERVENTIONS DESIRED   Disposition: SNF/LTAC  Time spent for the meeting: 37 mins   Colin Dawley, MD  07/31/2023, 6:02 PM

## 2023-07-31 NOTE — NC FL2 (Signed)
 [place]  MEDICAID FL2 LEVEL OF CARE FORM     IDENTIFICATION  Patient Name: Jonathan Poole Birthdate: 01-26-1956 Sex: male Admission Date (Current Location): 07/28/2023  [facility] and [place] Number:  [employer] and Address:  [facility],  [address], [place] [address]      Provider Number: [id number]  Attending Physician Name and Address:  Colin Dawley, MD  Relative Name and Phone Number:  Kingsten [place]field [phone]    Current Level of Care: Hospital Recommended Level of Care: Skilled Nursing Facility Prior Approval Number:    Date Approved/Denied:   PASRR Number: Pending  Discharge Plan: SNF    Current Diagnoses: Patient Active Problem List   Diagnosis Date Noted   Protein-calorie malnutrition, severe 07/30/2023   Acute conjunctivitis of right eye 07/30/2023   Acute metabolic encephalopathy 07/29/2023   AKI (acute kidney injury) (HCC) 07/29/2023   Lactic acidosis 07/29/2023   Seizure (HCC) 07/29/2023   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   Dementia with behavioral disturbance (HCC) 06/19/2023   Memory loss 06/05/2023   Restlessness and agitation 06/05/2023   Encephalopathy 06/04/2023   Hyperbilirubinemia 06/04/2023   Thrombocytopenia (HCC) 06/04/2023   Hyperchloremia 05/29/2023   Recurrent falls 05/29/2023   Vitamin B12 deficiency (non anemic) 05/25/2023   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   Chronic pain syndrome 04/08/2023   Encounter for general adult medical examination with abnormal findings 02/05/2022   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/22/2021   Chronic prescription opiate use 03/21/2021   H/O cardiac pacemaker 04/12/2020   Chronic atrial fibrillation (HCC) 04/12/2020   Alzheimer's disease (HCC) 04/12/2020   DDD (degenerative disc disease), lumbar 04/12/2020   Mixed hyperlipidemia 04/12/2020   Sinus node dysfunction (HCC) 01/02/2020   Elevated PSA 06/20/2019    Orientation  RESPIRATION BLADDER Height & Weight     Self  Normal Incontinent Weight: 71.2 kg Height:  6' (182.9 cm)  BEHAVIORAL SYMPTOMS/MOOD NEUROLOGICAL BOWEL NUTRITION STATUS      Incontinent Diet (see dc summary)  AMBULATORY STATUS COMMUNICATION OF NEEDS Skin   Limited Assist Verbally Skin abrasions                       Personal Care Assistance Level of Assistance  Bathing, Feeding, Dressing Bathing Assistance: Maximum assistance Feeding assistance: Limited assistance Dressing Assistance: Maximum assistance Total Care Assistance: Maximum assistance   Functional Limitations Info  Sight, Hearing, Speech Sight Info: Adequate Hearing Info: Adequate Speech Info: Adequate    SPECIAL CARE FACTORS FREQUENCY  PT (By licensed PT), OT (By licensed OT)     PT Frequency: 5 xweekly OT Frequency: 5 x weekly            Contractures Contractures Info: Not present    Additional Factors Info  Allergies, Code Status Code Status Info: Full Allergies Info: None known           Current Medications (07/31/2023):  This is the current hospital active medication list Current Facility-Administered Medications  Medication Dose Route Frequency Provider Last Rate Last Admin   acetaminophen  (TYLENOL ) tablet 650 mg  650 mg Oral Q6H PRN Adefeso, Oladapo, DO   650 mg at 07/31/23 1056   Or   acetaminophen  (TYLENOL ) suppository 650 mg  650 mg Rectal Q6H PRN Adefeso, Oladapo, DO       apixaban  (ELIQUIS ) tablet 5 mg  5 mg Oral BID Adefeso, Oladapo, DO   5 mg at 07/31/23 0856   artificial tears ophthalmic solution  1 drop  1 drop Right Eye PRN Arrien, Mauricio Daniel, MD       donepezil  (ARICEPT ) tablet 10 mg  10 mg Oral QHS Adefeso, Oladapo, DO   10 mg at 07/30/23 2220   feeding supplement (ENSURE PLUS HIGH PROTEIN) liquid 237 mL  237 mL Oral TID BM Arrien, Mauricio Daniel, MD   237 mL at 07/31/23 1302   haloperidol  (HALDOL ) tablet 1 mg  1 mg Oral Q6H PRN Arrien, Mauricio Daniel, MD   1 mg at 07/30/23  2220   Or   haloperidol  lactate (HALDOL ) injection 1 mg  1 mg Intramuscular Q6H PRN Arrien, Curlee Doss, MD       levETIRAcetam  (KEPPRA ) tablet 1,000 mg  1,000 mg Oral BID Adefeso, Oladapo, DO   1,000 mg at 07/31/23 1610   memantine  (NAMENDA ) tablet 10 mg  10 mg Oral BID Adefeso, Oladapo, DO   10 mg at 07/31/23 0856   multivitamin with minerals tablet 1 tablet  1 tablet Oral Daily Arrien, Curlee Doss, MD   1 tablet at 07/31/23 0856   ondansetron  (ZOFRAN ) tablet 4 mg  4 mg Oral Q6H PRN Adefeso, Oladapo, DO       Or   ondansetron  (ZOFRAN ) injection 4 mg  4 mg Intravenous Q6H PRN Adefeso, Oladapo, DO       rosuvastatin  (CRESTOR ) tablet 20 mg  20 mg Oral q AM Adefeso, Oladapo, DO   20 mg at 07/31/23 0856   thiamine  (VITAMIN B1) tablet 500 mg  500 mg Oral Daily Arrien, Curlee Doss, MD   500 mg at 07/31/23 0855   tobramycin (TOBREX) 0.3 % ophthalmic solution 1 drop  1 drop Right Eye Q6H Arrien, Curlee Doss, MD   1 drop at 07/31/23 1058     Discharge Medications: Please see discharge summary for a list of discharge medications.  Relevant Imaging Results:  Relevant Lab Results:   Additional Information SSN: [id number]  Geraldina Klinefelter, RN

## 2023-07-31 NOTE — Progress Notes (Signed)
 Dementia note:  Please be advised that the above-named patient has a primary diagnosis of dementia which supersedes any psychiatric diagnosis.

## 2023-07-31 NOTE — Progress Notes (Signed)
30 Day Note  To whom it May Concern: Please be advised that the above name patient will require a short-term nursing home stay- anticipated 30 days or less rehabilitation and strengthening. The plan is for return home.

## 2023-07-31 NOTE — Plan of Care (Signed)

## 2023-07-31 NOTE — Care Management Important Message (Signed)
 Important Message  Patient Details  Name: LETRELL ATTWOOD MRN: [id number] Date of Birth: 19-Aug-1955   Important Message Given:  N/A - LOS <3 / Initial given by admissions     Neila Bally 07/31/2023, 11:28 AM

## 2023-07-31 NOTE — Progress Notes (Signed)
 Progress Note   Patient: Jonathan Poole:[id number] DOB: 1956-02-03 DOA: 07/28/2023     2 DOS: the patient was seen and examined on 07/31/2023   Brief hospital course: Jonathan Poole was admitted to the hospital with the working diagnosis of acute metabolic encephalopathy   68 yo male with the past medical history of hyperlipidemia, atrial fibrillation, seizures, and dementia who presented with altered mental status. At home patient was noted to have decreased responsiveness, not at his baseline. Acute change around midnight on the day of admission, his wife called EMS. He was found sitting in room, rigid and unable to stand, noted to have staring spells and he was not verbal. His 02 saturation was 89% on room air. He was placed on 2 L/ min per Jal and was transported to the ED.  On his initial physical examination he was not able to provide any history due to cognitive impairment, his blood pressure was 109/74, HR 76, RR 12 and 02 saturation 93% Lungs with no wheezing or rhonchi, no rales, heart with S1 and S2 present and regular, with no gallops or rubs, abdomen with no distention and no lower extremity edema. He was non focal.   Na 138, K 4,4 CL 99, bicarbonate 24 glucose 122, bun 19 cr 1,29  AST 41 ALT 60  Lactic acid 3,0, 2,8 and 1,4  Wbc 5.2 hgb 13.0 plt 117  Urine analysis SG 1,020, protein 30, leukocytes negative, hgb large, 0-5 wbc, > 50 rbc.  Toxicology screen negative   CT head with no evidence of acute intracranial abnormalities Atrophy and chronic small vessel ischemic changes of the white matter.   Chest radiograph with hypoinflation, left base atelectasis, with pacemaker in place with one right artrial and one right ventricular lead. No cardiomegaly.   EKG 74 bpm, normal axis, normal intervals, qtc 482, atrial paced rhythm with ventricular sensed, with no significant ST segment or T wave changes.   06/19 patient continue confused and disorientated. Poor oral intake and  refusing to take po meds. Requiring sitter one to one for safety.   Patient is medically ready for discharge to SNF rehab as of 07/31/2023 if SNF bed is available and insurance authorization can be obtained  Assessment and Plan: 1) Acute metabolic encephalopathy--- superimposed on advanced dementia Appears pretty close to baseline at this time  -- Only oriented x 1 -CT head, blood cultures, EEG and UA without significant acute findings - UDS Neg  2)Advanced Dementia--- as per patient's wife patient had symptoms of dementia as early as age 82, he was officially diagnosed with dementia around age 69 -- Symptoms have progressed more rapidly over the last year -Continue Aricept  and Namenda  -c/n thiamine , multivitamins and nutritional supplements.   3)Social/Ethics----Phone conference with patient's wife, social worker Jonathan Poole present on 07/31/23 =-Patient with advanced dementia - As per wife patient has advanced directives stating no feeding tubes and requesting DNR status - Patient and his wife have been  married since age 36, they do have 3 sons. - Wife is Scientific laboratory technician - She request DNR/DNI status for patient consistent with patient's previously expressed wishes  4)Seizure (HCC) EEG with moderate diffuse encephalopathy with no seizures or epileptiform discharges throughout the recordings.  Continue oral keppra  - No reported seizures lately  5)Chronic atrial fibrillation (HCC) -Continue apixaban  for stroke prophylaxis -Not on rate control agents - 6)Mixed hyperlipidemia -On Crestor  PTA - Consider stopping Crestor  given overall poor prognosis for patient and limited life expectancy  7)Protein-calorie  malnutrition---Anorexia - Appetite remains poor given nutritional supplements, - Give Remeron nightly for appetite stimulation  8)HYpokalemia--replace and recheck  9)AKI----acute kidney injury    creatinine on admission=  1.29,  baseline creatinine = 0.8 to 1.0    ,   -creatinine is now= 0.71  , --Renal function has normalized with hydration - renally adjust medications, avoid nephrotoxic agents / dehydration  / hypotension  10) generalized weakness and deconditioning--PT eval appreciated recommends SNF rehab  Subjective:   -- Remains confused and disoriented Appetite is not great - No fevers no emesis -- Patient is medically ready for discharge to SNF rehab as of 07/31/2023 if SNF bed is available and insurance authorization can be obtained  Physical Exam: Vitals:   07/30/23 0749 07/30/23 1430 07/30/23 1923 07/31/23 0855  BP:  136/80 (!) 140/87 137/87  Pulse:  76 74 70  Resp:  16 17   Temp:  98.2 F (36.8 C) 97.8 F (36.6 C)   TempSrc:  Oral Oral   SpO2:  97% 100% 98%  Weight:      Height: 6' (1.829 m)       Physical Exam  Gen:- Awake Alert, in no acute distress , pleasantly confused HEENT:- Jonathan Poole, No sclera icterus Neck-Supple Neck,No JVD,.  Lungs-  CTAB , fair air movement bilaterally  CV- S1, S2 normal, RRR Abd-  +ve B.Sounds, Abd Soft, No tenderness,    Extremity/Skin:- No  edema,   good pedal pulses  Psych-affect is appropriate, oriented x1, underlying cognitive and memory deficits noted Neuro-generalized weakness, no new focal deficits, no tremors  Family Communication: --Phone conference with patient's wife and social worker Jonathan Poole on 07/31/2023, questions answered   Disposition: SNF  Status is: Inpatient  Author: Colin Dawley, MD 07/31/2023 6:00 PM  For on call review [email or web address].

## 2023-08-01 ENCOUNTER — Other Ambulatory Visit: Payer: Self-pay

## 2023-08-01 DIAGNOSIS — G9341 Metabolic encephalopathy: Secondary | ICD-10-CM | POA: Diagnosis not present

## 2023-08-01 LAB — BASIC METABOLIC PANEL WITH GFR
Anion gap: 11 (ref 5–15)
BUN: 6 mg/dL — ABNORMAL LOW (ref 8–23)
CO2: 26 mmol/L (ref 22–32)
Calcium: 9 mg/dL (ref 8.9–10.3)
Chloride: 104 mmol/L (ref 98–111)
Creatinine, Ser: 0.72 mg/dL (ref 0.61–1.24)
GFR, Estimated: >60 mL/min (ref >60)
Glucose, Bld: 92 mg/dL (ref 70–99)
Potassium: 3.9 mmol/L (ref 3.5–5.1)
Sodium: 141 mmol/L (ref 135–145)

## 2023-08-01 LAB — TSH: TSH: 6.034 u[IU]/mL — ABNORMAL HIGH (ref 0.350–4.500)

## 2023-08-01 LAB — LEVETIRACETAM LEVEL: Levetiracetam Lvl: 23.7 ug/mL (ref 10.0–40.0)

## 2023-08-01 LAB — VITAMIN B12: Vitamin B-12: 611 pg/mL (ref 180–914)

## 2023-08-01 MED ORDER — LACTATED RINGERS IV SOLN: INTRAVENOUS | Status: AC

## 2023-08-01 NOTE — Plan of Care (Signed)

## 2023-08-01 NOTE — Progress Notes (Signed)
 PROGRESS NOTE    Jonathan Poole  FMW:[id number] DOB: [birth year] DOA: 07/28/2023 PCP: Jonathan [name], MD   Brief Narrative: 68 year old with past medical history significant for hyperlipidemia, A-fib, seizure, dementia presented with altered mental status.  At home he was noted to have decreased responsiveness, not at his baseline.  Wife called EMS found him sitting in the room, rigid and unable to stand, noted to have some staring spells and he was not verbal.  His oxygen saturation was 89 on room air.  He was placed on 2 L of oxygen and admitted for further evaluation.  Patient admitted with acute metabolic encephalopathy, UA negative for pyuria, CT head no evidence of acute intracranial abnormality, chest x-ray with hypoinflation left base atelectasis.  Currently patient awaiting placement for rehab  Assessment & Plan:   Principal Problem:   Acute metabolic encephalopathy Active Problems:   AKI (acute kidney injury) (HCC)   Alzheimer's disease (HCC)   Seizure (HCC)   Chronic atrial fibrillation (HCC)   Mixed hyperlipidemia   Protein-calorie malnutrition, severe   Acute conjunctivitis of right eye  1-Acute metabolic encephalopathy superimposed on advanced dementia - Patient presented with worsening lethargy and confusion. - This could have been related to dehydration, low oxygen on admission - Appears to be back to baseline - CT head no acute finding, blood cultures no growth to date, EEG negative, UA negative for infection - B12, TSH, RPR ordered  2-Advanced dementia: Patient started having symptoms of dementia and is elderly at 68 year old. Symptoms has been progressing worse rapidly over the last year Continue Aricept  and Namenda  Continue multivitamin  Social/ethics: Phone conference with the wife and Dr. Roseanna.  CODE STATUS was changed to DNR/DNI  Hypoxia on admission could be related to hypoventilation Resolved  Seizure disorder: EEG with moderate diffuse  encephalopathy with no seizures or epileptiform discharge.  Continue with Keppra   Chronic atrial fibrillation (HCC) Continue apixaban  Not on rate control agents  Mixed hyperlipidemia On Crestor  PTA Consider stopping Crestor  given overall poor prognosis for patient and limited life expectancy   Protein-calorie malnutrition---Anorexia Appetite remains poor given nutritional supplements, Give Remeron  nightly for appetite stimulation   Hypokalemia--replaced   9)AKI----acute kidney injury  Creatinine on admission=  1.29,  baseline creatinine = 0.8 to 1.0    ,  -creatinine is now= 0.71  , --Renal function has normalized with hydration   generalized weakness and deconditioning--PT eval appreciated recommends SNF rehab    Nutrition Problem: Severe Malnutrition Etiology: social / environmental circumstances    Signs/Symptoms: severe muscle depletion, severe fat depletion, percent weight loss (8% weight loss within 3 months) Percent weight loss: 8 %    Interventions: Ensure Enlive (each supplement provides 350kcal and 20 grams of protein), MVI, Magic cup  Estimated body mass index is 21.29 kg/m as calculated from the following:   Height as of this encounter: 6' (1.829 m).   Weight as of this encounter: 71.2 kg.   DVT prophylaxis: Eliquis  Code Status: DNR Family Communication: Disposition Plan:  Status is: Inpatient Remains inpatient appropriate because: awaiting SNF    Consultants:  none  Procedures:    Antimicrobials:    Subjective: He is alert, confuse.   Objective: Vitals:   07/30/23 1430 07/30/23 1923 07/31/23 0855 08/01/23 0520  BP: 136/80 (!) 140/87 137/87 (!) 149/96  Pulse: 76 74 70 74  Resp: 16 17  16   Temp: 98.2 F (36.8 C) 97.8 F (36.6 C)  98.3 F (36.8 C)  TempSrc: Oral Oral  Oral  SpO2: 97% 100% 98% 100%  Weight:      Height:        Intake/Output Summary (Last 24 hours) at 08/01/2023 9187 Last data filed at 08/01/2023 0515 Gross per  24 hour  Intake --  Output 1000 ml  Net -1000 ml   Filed Weights   07/29/23 0330  Weight: 71.2 kg    Examination:  General exam: Appears calm and comfortable  Respiratory system: Clear to auscultation. Respiratory effort normal. Cardiovascular system: S1 & S2 heard, RRR.  Gastrointestinal system: Abdomen is nondistended, soft and nontender. No organomegaly or masses felt. Normal bowel sounds heard. Central nervous system: Alert  Extremities: Symmetric 5 x 5 power.    Data Reviewed: I have personally reviewed following labs and imaging studies  CBC: Recent Labs  Lab 07/28/23 1833 07/29/23 0356 07/30/23 0704  WBC 6.1 5.2 5.6  HGB 14.1 13.0 13.6  HCT 41.4 38.5* 39.8  MCV 87.3 88.3 86.9  PLT 142* 117* 139*   Basic Metabolic Panel: Recent Labs  Lab 07/28/23 1833 07/29/23 0356 07/30/23 0704 07/31/23 0409  NA 138 140 137 138  K 4.4 4.0 3.4* 3.3*  CL 99 103 101 103  CO2 24 27 25 25   GLUCOSE 122* 99 119* 110*  BUN 19 15 8  <5*  CREATININE 1.29* 0.99 0.82 0.71  CALCIUM  9.3 8.6* 8.5* 8.5*  MG  --  1.9  --   --   PHOS  --  3.1  --   --    GFR: Estimated Creatinine Clearance: 89 mL/min (by C-G formula based on SCr of 0.71 mg/dL). Liver Function Tests: Recent Labs  Lab 07/28/23 1833 07/29/23 0356  AST 41 46*  ALT 60* 50*  ALKPHOS 66 57  BILITOT 1.5* 1.2  PROT 6.8 5.9*  ALBUMIN 4.1 3.5   No results for input(s): LIPASE, AMYLASE in the last 168 hours. Recent Labs  Lab 07/28/23 1921  AMMONIA 32   Coagulation Profile: No results for input(s): INR, PROTIME in the last 168 hours. Cardiac Enzymes: No results for input(s): CKTOTAL, CKMB, CKMBINDEX, TROPONINI in the last 168 hours. BNP (last 3 results) No results for input(s): PROBNP in the last 8760 hours. HbA1C: No results for input(s): HGBA1C in the last 72 hours. CBG: Recent Labs  Lab 07/28/23 1944  GLUCAP 116*   Lipid Profile: No results for input(s): CHOL, HDL, LDLCALC,  TRIG, CHOLHDL, LDLDIRECT in the last 72 hours. Thyroid  Function Tests: No results for input(s): TSH, T4TOTAL, FREET4, T3FREE, THYROIDAB in the last 72 hours. Anemia Panel: No results for input(s): VITAMINB12, FOLATE, FERRITIN, TIBC, IRON, RETICCTPCT in the last 72 hours. Sepsis Labs: Recent Labs  Lab 07/28/23 1833 07/28/23 2032 07/29/23 0356  LATICACIDVEN 3.0* 2.8* 1.4    Recent Results (from the past 240 hours)  Culture, blood (routine x 2)     Status: None (Preliminary result)   Collection Time: 07/28/23  7:21 PM   Specimen: BLOOD  Result Value Ref Range Status   Specimen Description BLOOD BLOOD RIGHT HAND  Final   Special Requests   Final    BOTTLES DRAWN AEROBIC ONLY Blood Culture results may not be optimal due to an inadequate volume of blood received in culture bottles   Culture   Final    NO GROWTH 3 DAYS Performed at [facility], [address] [place], [place] [address]    Report Status PENDING  Incomplete  Culture, blood (routine x 2)     Status: None (Preliminary result)  Collection Time: 07/28/23  7:21 PM   Specimen: BLOOD  Result Value Ref Range Status   Specimen Description BLOOD BLOOD LEFT HAND  Final   Special Requests   Final    BOTTLES DRAWN AEROBIC ONLY Blood Culture results may not be optimal due to an inadequate volume of blood received in culture bottles   Culture   Final    NO GROWTH 3 DAYS Performed at [facility], [address] [place], [place] [address]    Report Status PENDING  Incomplete         Radiology Studies: No results found.      Scheduled Meds:  apixaban   5 mg Oral BID   buprenorphine -naloxone   1 tablet Sublingual BID   donepezil   10 mg Oral QHS   feeding supplement  237 mL Oral TID BM   levETIRAcetam   1,000 mg Oral BID   memantine   10 mg Oral BID   mirtazapine   15 mg Oral QHS   multivitamin with minerals  1 tablet Oral Daily   rosuvastatin   20 mg Oral q AM   thiamine   500 mg Oral Daily    tobramycin   1 drop Right Eye Q6H   Continuous Infusions:   LOS: 3 days    Time spent: 35 minutes    Raymona Boss A Normalee Sistare, MD [facility]   If 7PM-7AM, please contact night-coverage [email or web address]  08/01/2023, 8:12 AM

## 2023-08-01 NOTE — Plan of Care (Signed)
   Problem: Education: Goal: Knowledge of General Education information will improve Description: Including pain rating scale, medication(s)/side effects and non-pharmacologic comfort measures Outcome: Progressing   Problem: Clinical Measurements: Goal: Ability to maintain clinical measurements within normal limits will improve Outcome: Progressing Goal: Diagnostic test results will improve Outcome: Progressing

## 2023-08-01 NOTE — Plan of Care (Signed)
   Problem: Clinical Measurements: Goal: Will remain free from infection Outcome: Progressing   Problem: Clinical Measurements: Goal: Respiratory complications will improve Outcome: Progressing   Problem: Clinical Measurements: Goal: Cardiovascular complication will be avoided Outcome: Progressing

## 2023-08-01 NOTE — Progress Notes (Signed)
 Patient walked in hallway several times today . He is able to make it to sink across from nursing station and back to room. Patient remains confused as to place time and situation. Patient will not eat unless fed by staff. Patient has climbed out of bed several times during shift and was redirected either to chair at bedside or back to bed.

## 2023-08-02 DIAGNOSIS — G9341 Metabolic encephalopathy: Secondary | ICD-10-CM | POA: Diagnosis not present

## 2023-08-02 LAB — T4, FREE: Free T4: 1.11 ng/dL (ref 0.61–1.12)

## 2023-08-02 LAB — CULTURE, BLOOD (ROUTINE X 2)
Culture: NO GROWTH
Culture: NO GROWTH

## 2023-08-02 NOTE — TOC CM/SW Note (Signed)
 [facility] accepted pt referral and wife is agreeable.  Auth started. Mem ID; [id number] Reference # [id number] Supporting docs sent via fax to: [phone] Fax confirmation received

## 2023-08-02 NOTE — Progress Notes (Signed)
 PROGRESS NOTE    Jonathan Poole  FMW:[id number] DOB: 1955-06-09 DOA: 07/28/2023 PCP: [name] [name], MD   Brief Narrative: 68 year old with past medical history significant for hyperlipidemia, A-fib, seizure, dementia presented with altered mental status.  At home he was noted to have decreased responsiveness, not at his baseline.  Wife called EMS found him sitting in the room, rigid and unable to stand, noted to have some staring spells and he was not verbal.  His oxygen saturation was 89 on room air.  He was placed on 2 L of oxygen and admitted for further evaluation.  Patient admitted with acute metabolic encephalopathy, UA negative for pyuria, CT head no evidence of acute intracranial abnormality, chest x-ray with hypoinflation left base atelectasis.  Currently patient awaiting placement for rehab.  Assessment & Plan:   Principal Problem:   Acute metabolic encephalopathy Active Problems:   AKI (acute kidney injury) (HCC)   Alzheimer's disease (HCC)   Seizure (HCC)   Chronic atrial fibrillation (HCC)   Mixed hyperlipidemia   Protein-calorie malnutrition, severe   Acute conjunctivitis of right eye  1-Acute metabolic encephalopathy superimposed on advanced dementia - Patient presented with worsening lethargy and confusion. - This could have been related to dehydration, low oxygen on admission - Appears to be back to baseline - CT head no acute finding, blood cultures no growth to date, EEG negative, UA negative for infection - B12: 611, TSH: 6.03 Elevated, Free T 3 and T 4 pending.   2-Advanced Dementia: - Patient started having symptoms of dementia and is elderly at 68 year old. Symptoms has been progressing worse rapidly over the last year Continue Aricept  and Namenda  Continue multivitamin  Social/ethics: Phone conference with the wife and Dr. Roseanna.  CODE STATUS was changed to DNR/DNI  Hypoxia on admission could be related to hypoventilation Resolved  Seizure  disorder: EEG with moderate diffuse encephalopathy with no seizures or epileptiform discharge.  Continue with Keppra   Chronic atrial fibrillation (HCC) Continue apixaban  Not on rate control agents  Mixed hyperlipidemia On Crestor  PTA Consider stopping Crestor  given overall poor prognosis for patient and limited life expectancy   Protein-calorie malnutrition---Anorexia Appetite remains poor given nutritional supplements, Give Remeron  nightly for appetite stimulation   Hypokalemia--Replaced   9)AKI----acute kidney injury  Creatinine on admission=  1.29,  baseline creatinine = 0.8 to 1.0    ,  -creatinine is now= 0.71  , --Renal function has normalized with hydration   generalized weakness and deconditioning--PT eval appreciated recommends SNF rehab    Nutrition Problem: Severe Malnutrition Etiology: social / environmental circumstances    Signs/Symptoms: severe muscle depletion, severe fat depletion, percent weight loss (8% weight loss within 3 months) Percent weight loss: 8 %    Interventions: Ensure Enlive (each supplement provides 350kcal and 20 grams of protein), MVI, Magic cup  Estimated body mass index is 21.29 kg/m as calculated from the following:   Height as of this encounter: 6' (1.829 m).   Weight as of this encounter: 71.2 kg.   DVT prophylaxis: Eliquis  Code Status: DNR Family Communication: Disposition Plan:  Status is: Inpatient Remains inpatient appropriate because: awaiting SNF    Consultants:  none  Procedures:    Antimicrobials:    Subjective: Alert, able to tell me his name. Oriented times 1  Objective: Vitals:   08/01/23 0520 08/01/23 1323 08/01/23 1945 08/02/23 0525  BP: (!) 149/96 106/83 (!) 140/83 (!) 140/82  Pulse: 74 92 78 74  Resp: 16 18 14  (!) 8  Temp: 98.3  F (36.8 C)  98.4 F (36.9 C) 98.1 F (36.7 C)  TempSrc: Oral  Oral Oral  SpO2: 100% 94% 96% 97%  Weight:      Height:        Intake/Output Summary (Last 24  hours) at 08/02/2023 1228 Last data filed at 08/02/2023 0530 Gross per 24 hour  Intake 1743.45 ml  Output 1100 ml  Net 643.45 ml   Filed Weights   07/29/23 0330  Weight: 71.2 kg    Examination:  General exam: NAD Respiratory system: CTA Cardiovascular system: S 1, S 2 RRR Gastrointestinal system: BS present, soft, nt Central nervous system: alert Extremities: Symmetric 5 x 5 power.    Data Reviewed: I have personally reviewed following labs and imaging studies  CBC: Recent Labs  Lab 07/28/23 1833 07/29/23 0356 07/30/23 0704  WBC 6.1 5.2 5.6  HGB 14.1 13.0 13.6  HCT 41.4 38.5* 39.8  MCV 87.3 88.3 86.9  PLT 142* 117* 139*   Basic Metabolic Panel: Recent Labs  Lab 07/28/23 1833 07/29/23 0356 07/30/23 0704 07/31/23 0409 08/01/23 0842  NA 138 140 137 138 141  K 4.4 4.0 3.4* 3.3* 3.9  CL 99 103 101 103 104  CO2 24 27 25 25 26   GLUCOSE 122* 99 119* 110* 92  BUN 19 15 8  <5* 6*  CREATININE 1.29* 0.99 0.82 0.71 0.72  CALCIUM  9.3 8.6* 8.5* 8.5* 9.0  MG  --  1.9  --   --   --   PHOS  --  3.1  --   --   --    GFR: Estimated Creatinine Clearance: 89 mL/min (by C-G formula based on SCr of 0.72 mg/dL). Liver Function Tests: Recent Labs  Lab 07/28/23 1833 07/29/23 0356  AST 41 46*  ALT 60* 50*  ALKPHOS 66 57  BILITOT 1.5* 1.2  PROT 6.8 5.9*  ALBUMIN 4.1 3.5   No results for input(s): LIPASE, AMYLASE in the last 168 hours. Recent Labs  Lab 07/28/23 1921  AMMONIA 32   Coagulation Profile: No results for input(s): INR, PROTIME in the last 168 hours. Cardiac Enzymes: No results for input(s): CKTOTAL, CKMB, CKMBINDEX, TROPONINI in the last 168 hours. BNP (last 3 results) No results for input(s): PROBNP in the last 8760 hours. HbA1C: No results for input(s): HGBA1C in the last 72 hours. CBG: Recent Labs  Lab 07/28/23 1944  GLUCAP 116*   Lipid Profile: No results for input(s): CHOL, HDL, LDLCALC, TRIG, CHOLHDL, LDLDIRECT  in the last 72 hours. Thyroid  Function Tests: Recent Labs    08/01/23 0842  TSH 6.034*   Anemia Panel: Recent Labs    08/01/23 0842  VITAMINB12 611   Sepsis Labs: Recent Labs  Lab 07/28/23 1833 07/28/23 2032 07/29/23 0356  LATICACIDVEN 3.0* 2.8* 1.4    Recent Results (from the past 240 hours)  Culture, blood (routine x 2)     Status: None   Collection Time: 07/28/23  7:21 PM   Specimen: BLOOD  Result Value Ref Range Status   Specimen Description BLOOD BLOOD RIGHT HAND  Final   Special Requests   Final    BOTTLES DRAWN AEROBIC ONLY Blood Culture results may not be optimal due to an inadequate volume of blood received in culture bottles   Culture   Final    NO GROWTH 5 DAYS Performed at [facility], [address] [place], [place] [address]    Report Status 08/02/2023 FINAL  Final  Culture, blood (routine x 2)  Status: None   Collection Time: 07/28/23  7:21 PM   Specimen: BLOOD  Result Value Ref Range Status   Specimen Description BLOOD BLOOD LEFT HAND  Final   Special Requests   Final    BOTTLES DRAWN AEROBIC ONLY Blood Culture results may not be optimal due to an inadequate volume of blood received in culture bottles   Culture   Final    NO GROWTH 5 DAYS Performed at [facility], [address], [place], [place] [address]    Report Status 08/02/2023 FINAL  Final         Radiology Studies: No results found.      Scheduled Meds:  apixaban   5 mg Oral BID   buprenorphine -naloxone   1 tablet Sublingual BID   donepezil   10 mg Oral QHS   feeding supplement  237 mL Oral TID BM   levETIRAcetam   1,000 mg Oral BID   memantine   10 mg Oral BID   mirtazapine   15 mg Oral QHS   multivitamin with minerals  1 tablet Oral Daily   rosuvastatin   20 mg Oral q AM   thiamine   500 mg Oral Daily   tobramycin   1 drop Right Eye Q6H   Continuous Infusions:   LOS: 4 days    Time spent: 35 minutes    Thierno Hun A Kenyette Gundy, MD [facility]   If 7PM-7AM,  please contact night-coverage [email or web address]  08/02/2023, 12:28 PM

## 2023-08-02 NOTE — TOC Initial Note (Signed)
 Transition of Care [facility]) - Initial/Assessment Note   Patient Details  Name: Jonathan Poole MRN: [id number] Date of Birth: 12/09/1955  Transition of Care [facility]) CM/SW Contact:    Nena LITTIE Coffee, RN Phone Number: 08/02/2023, 1:49 PM  Expected Discharge Plan: Skilled Nursing Facility Barriers to Discharge: Awaiting State Approval [facility])  Patient Goals and CMS Choice   CMS Medicare.gov Compare Post Acute Care list provided to:: Patient Represenative (must comment)   [place] ownership interest in [facility].provided to:: Spouse   Expected Discharge Plan and Services In-house Referral: Clinical Social Work Discharge Planning Services: CM Consult Post Acute Care Choice: Skilled Nursing Facility Living arrangements for the past 2 months: Single Family Home Expected Discharge Date: 07/31/23                  Prior Living Arrangements/Services Living arrangements for the past 2 months: Single Family Home Lives with:: Spouse Patient language and need for interpreter reviewed:: Yes        Need for Family Participation in Patient Care: Yes (Comment) Care giver support system in place?: Yes (comment)   Criminal Activity/Legal Involvement Pertinent to Current Situation/Hospitalization: No - Comment as needed  Activities of Daily Living   ADL Screening (condition at time of admission) Independently performs ADLs?: No Does the patient have a NEW difficulty with bathing/dressing/toileting/self-feeding that is expected to last >3 days?: No Does the patient have a NEW difficulty with getting in/out of bed, walking, or climbing stairs that is expected to last >3 days?: No Does the patient have a NEW difficulty with communication that is expected to last >3 days?: No Is the patient deaf or have difficulty hearing?: No Does the patient have difficulty seeing, even when wearing glasses/contacts?: No Does the patient have difficulty concentrating, remembering, or making  decisions?: Yes  Permission Sought/Granted Permission sought to share information with : Case Manager, Family Supports, Magazine features editor Permission granted to share information with :  (Wife consents)  Emotional Assessment Appearance:: Appears older than stated age Attitude/Demeanor/Rapport: Other (comment) (Confused) Affect (typically observed): Calm Orientation: : Oriented to Self Alcohol  / Substance Use: Not Applicable Psych Involvement: No (comment)  Admission diagnosis:  Dehydration [E86.0] Lactic acidosis [E87.20] Acute metabolic encephalopathy [G93.41] Patient Active Problem List   Diagnosis Date Noted   Protein-calorie malnutrition, severe 07/30/2023   Acute conjunctivitis of right eye 07/30/2023   Acute metabolic encephalopathy 07/29/2023   AKI (acute kidney injury) (HCC) 07/29/2023   Lactic acidosis 07/29/2023   Seizure (HCC) 07/29/2023   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   Dementia with behavioral disturbance (HCC) 06/19/2023   Memory loss 06/05/2023   Restlessness and agitation 06/05/2023   Encephalopathy 06/04/2023   Hyperbilirubinemia 06/04/2023   Thrombocytopenia (HCC) 06/04/2023   Hyperchloremia 05/29/2023   Recurrent falls 05/29/2023   Vitamin B12 deficiency (non anemic) 05/25/2023   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   Chronic pain syndrome 04/08/2023   Encounter for general adult medical examination with abnormal findings 02/05/2022   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/22/2021   Chronic prescription opiate use 03/21/2021   H/O cardiac pacemaker 04/12/2020   Chronic atrial fibrillation (HCC) 04/12/2020   Alzheimer's disease (HCC) 04/12/2020   DDD (degenerative disc disease), lumbar 04/12/2020   Mixed hyperlipidemia 04/12/2020   Sinus node dysfunction (HCC) 01/02/2020   Elevated PSA 06/20/2019   PCP:  [name] [name], MD Pharmacy:   [facility] [phone] - [place], [place] - [address] [facility] [address] [place] [address] Phone: [phone] Fax: [phone]  Social Drivers of Health (SDOH) Social History: SDOH Screenings   Food Insecurity: Patient Unable To Answer (07/29/2023)  Housing: Unknown (07/29/2023)  Transportation Needs: Patient Unable To Answer (07/29/2023)  Utilities: Patient Unable To Answer (07/29/2023)  Alcohol  Screen: Low Risk  (04/10/2023)  Depression (PHQ2-9): Low Risk  (05/21/2023)  Financial Resource Strain: Low Risk  (05/21/2023)  Physical Activity: Inactive (04/10/2023)  Social Connections: Patient Unable To Answer (07/29/2023)  Stress: No Stress Concern Present (04/10/2023)  Tobacco Use: Medium Risk (07/28/2023)  Health Literacy: Inadequate Health Literacy (04/10/2023)   SDOH Interventions:   Readmission Risk Interventions     No data to display

## 2023-08-02 NOTE — Progress Notes (Signed)
   08/02/23 1123  TOC Barriers to Discharge  Barriers to Discharge Awaiting State Approval [facility])   PASRR request is under manual review according to to [email or web address] website. There are no new messages requesting additional information. Attempted to call immediate assistance numbers [phone] and [phone]) without success.

## 2023-08-03 DIAGNOSIS — G9341 Metabolic encephalopathy: Secondary | ICD-10-CM | POA: Diagnosis not present

## 2023-08-03 MED ORDER — TOBRAMYCIN 0.3 % OP SOLN: 1.0000 [drp] | Freq: Four times a day (QID) | OPHTHALMIC | Status: DC

## 2023-08-03 MED ORDER — ENSURE PLUS HIGH PROTEIN PO LIQD: 237.0000 mL | Freq: Three times a day (TID) | ORAL | Status: AC

## 2023-08-03 MED ORDER — BUPRENORPHINE HCL-NALOXONE HCL 8-2 MG SL FILM: 1.0000 | ORAL_FILM | Freq: Three times a day (TID) | SUBLINGUAL | 0 refills | Status: DC

## 2023-08-03 MED ORDER — THIAMINE MONONITRATE 250 MG PO TABS: 500.0000 mg | ORAL_TABLET | Freq: Every day | ORAL | Status: AC

## 2023-08-03 MED ORDER — ADULT MULTIVITAMIN W/MINERALS CH: 1.0000 | ORAL_TABLET | Freq: Every day | ORAL | Status: AC

## 2023-08-03 MED ORDER — POLYVINYL ALCOHOL 1.4 % OP SOLN: 1.0000 [drp] | OPHTHALMIC | Status: DC | PRN

## 2023-08-03 NOTE — Progress Notes (Signed)
 Called report to [place], LPN at [facility].

## 2023-08-03 NOTE — Discharge Summary (Signed)
 Physician Discharge Summary   Patient: Jonathan Poole MRN: [id number] DOB: [birth year]  Admit date:     07/28/2023  Discharge date: 08/03/23  Discharge Physician: Bernardino KATHEE Come   PCP: [name] [name], MD   Recommendations at discharge:  Continue routine lab monitoring and PCP follow up after SNF discharge.  Continue goals of care discussions regarding patient with advanced dementia and high predicted 70-month mortality.   Discharge Diagnoses: Principal Problem:   Acute metabolic encephalopathy Active Problems:   AKI (acute kidney injury) (HCC)   Alzheimer's disease (HCC)   Seizure (HCC)   Chronic atrial fibrillation (HCC)   Mixed hyperlipidemia   Protein-calorie malnutrition, severe   Acute conjunctivitis of right eye  Hospital Course: [name] was admitted to the hospital with the working diagnosis of acute metabolic encephalopathy    68 yo male with the past medical history of hyperlipidemia, atrial fibrillation, seizures, and dementia who presented with altered mental status. At home patient was noted to have decreased responsiveness, not at his baseline. Acute change around midnight on the day of admission, his wife called EMS. He was found sitting in room, rigid and unable to stand, noted to have staring spells and he was not verbal. His 02 saturation was 89% on room air. He was placed on 2 L/ min per [place] and was transported to the ED.  On his initial physical examination he was not able to provide any history due to cognitive impairment, his blood pressure was 109/74, HR 76, RR 12 and 02 saturation 93% Lungs with no wheezing or rhonchi, no rales, heart with S1 and S2 present and regular, with no gallops or rubs, abdomen with no distention and no lower extremity edema. He was non focal.    Na 138, K 4,4 CL 99, bicarbonate 24 glucose 122, bun 19 cr 1,29  AST 41 ALT 60  Lactic acid 3,0, 2,8 and 1,4  Wbc 5.2 hgb 13.0 plt 117  Urine analysis SG 1,020, protein 30, leukocytes  negative, hgb large, 0-5 wbc, > 50 rbc.  Toxicology screen negative    CT head with no evidence of acute intracranial abnormalities Atrophy and chronic small vessel ischemic changes of the white matter.    Chest radiograph with hypoinflation, left base atelectasis, with pacemaker in place with one right artrial and one right ventricular lead. No cardiomegaly.    EKG 74 bpm, normal axis, normal intervals, qtc 482, atrial paced rhythm with ventricular sensed, with no significant ST segment or T wave changes.    Confusion slowly improved with treatment  Assessment and Plan: Acute metabolic encephalopathy--- superimposed on advanced dementia: Has improved back near baseline. CT head, blood cultures, EEG and UA without significant acute findings. UDS Neg   Dementia: Per patient's wife patient had symptoms of dementia as early as age 38, he was officially diagnosed with dementia around age 89 -- Symptoms have progressed more rapidly over the last year - Continue Aricept  and Namenda , thiamine , multivitamins and nutritional supplements.    DNR status: Phone conference with patient's wife, social worker Nena Coffee present on 07/31/23 =-Patient with advanced dementia - As per wife patient has advanced directives stating no feeding tubes and requesting DNR status - Patient and his wife have been  married since age 39, they do have 3 sons. - Wife is Scientific laboratory technician - She request DNR/DNI status for patient consistent with patient's previously expressed wishes   Chronic pain: PDMP reviewed, confirmed regular prescriptions for buprenorphine  films which was reordered  at discharge. Gabapentin  last prescribed a 30-day supply in April. Given his presentation for encephalopathy and AKI, this will not be restarted at discharge.   Acute right eye conjunctivitis: Improving. Will continue gtt's.  Seizure disorder: EEG with moderate diffuse encephalopathy with no seizures or epileptiform discharges  throughout the recordings. No reported seizures lately - Continue oral keppra    Chronic atrial fibrillation: -Continue apixaban  for stroke prophylaxis -Not on rate control agents  HLD:  - Continuing rosuvastatin , though goals of care discussions can be continued regarding this medication.     Severe protein-calorie malnutrition---Anorexia - Appetite remains poor given nutritional supplements, - Give Remeron  nightly for appetite stimulation   Hypokalemia: Resolved - Monitor intermittently   AKI: SCr at admission was 1.29, has improved to normal baseline at 0.72. Renal function has normalized with hydration   Generalized weakness and deconditioning: PT evaluation completed, and we believe patient will benefit from SNF rehab, will pursue this.   Consultants: None Procedures performed: EEG  Disposition: Skilled nursing facility Diet recommendation:  Cardiac diet DISCHARGE MEDICATION: Allergies as of 08/03/2023   No Known Allergies      Medication List     STOP taking these medications    gabapentin  800 MG tablet Commonly known as: NEURONTIN        TAKE these medications    acetaminophen  500 MG tablet Commonly known as: TYLENOL  Take 500-1,000 mg by mouth every 6 (six) hours as needed (when not taking Ibuprofen).   apixaban  5 MG Tabs tablet Commonly known as: Eliquis  Take 1 tablet (5 mg total) by mouth 2 (two) times daily.   artificial tears ophthalmic solution Place 1 drop into the right eye as needed for dry eyes.   Buprenorphine  HCl-Naloxone  HCl 8-2 MG Film Place 1 Film under the tongue 3 (three) times daily.   cyanocobalamin  1000 MCG/ML injection Commonly known as: VITAMIN B12 Inject 1,000 mcg into the muscle every 30 (thirty) days.   donepezil  10 MG tablet Commonly known as: ARICEPT  TAKE 1 TABLET AT BEDTIME What changed: when to take this   feeding supplement Liqd Take 237 mLs by mouth 3 (three) times daily between meals.   Ibuprofen 200 MG  Caps Take 800 mg by mouth every 6 (six) hours as needed (for pain- when not taking Tylenol ).   levETIRAcetam  1000 MG tablet Commonly known as: Keppra  Take 1 tablet (1,000 mg total) by mouth 2 (two) times daily.   memantine  10 MG tablet Commonly known as: NAMENDA  Take 10 mg by mouth in the morning and at bedtime.   multivitamin with minerals Tabs tablet Take 1 tablet by mouth daily. Start taking on: August 04, 2023   naloxone  4 MG/0.1ML Liqd nasal spray kit Commonly known as: NARCAN  Place 1 spray into the nose once as needed (OD).   QUEtiapine 50 MG tablet Commonly known as: SEROQUEL Take 50 mg by mouth 3 (three) times daily as needed (mood).   Rexulti  3 MG Tabs Generic drug: Brexpiprazole  Take 3 mg by mouth in the morning.   rosuvastatin  20 MG tablet Commonly known as: CRESTOR  TAKE 1 TABLET ONE TIME DAILY What changed:  how much to take how to take this when to take this additional instructions   Thiamine  Mononitrate 250 MG Tabs Take 500 mg by mouth daily. Start taking on: August 04, 2023   tiZANidine  4 MG tablet Commonly known as: ZANAFLEX  Take 1 tablet (4 mg total) by mouth 3 (three) times daily. What changed:  when to take this reasons to take  this        Contact information for after-discharge care     Destination     [facility] .   Service: Skilled Nursing Contact information: [address] [place]  [address] [phone]                    Discharge Exam: Fredricka Weights   07/29/23 0330  Weight: 71.2 kg  Well-appearing male who is conversant but only oriented to person. Follows commands, no focal deficits. Clear lungs and nonlabored breathing. Normal rate of irregular cardiac rhythm, no edema.   Condition at discharge: stable  The results of significant diagnostics from this hospitalization (including imaging, microbiology, ancillary and laboratory) are listed below for reference.    Imaging Studies: EEG adult Result Date: 07/29/2023 Shelton Arlin KIDD, MD     07/29/2023  5:17 PM Patient Name: LAMARIUS DIRR MRN: [id number] Epilepsy Attending: Arlin KIDD Shelton Referring Physician/Provider: Adefeso, Oladapo, DO Date: 07/29/2023 Duration: 22.41 mins Patient history: 68yo M with ams. EEG to evaluate for seizure Level of alertness: Awake AEDs during EEG study: LEV Technical aspects: This EEG study was done with scalp electrodes positioned according to the 10-20 International system of electrode placement. Electrical activity was reviewed with band pass filter of 1-70Hz , sensitivity of 7 uV/mm, display speed of 2mm/sec with a 60Hz  notched filter applied as appropriate. EEG data were recorded continuously and digitally stored.  Video monitoring was available and reviewed as appropriate. Description: EEG showed continuous generalized 3 to 6 Hz theta-delta slowing. Hyperventilation and photic stimulation were not performed.   ABNORMALITY - Continuous slow, generalized IMPRESSION: This study is suggestive of moderate diffuse encephalopathy. No seizures or epileptiform discharges were seen throughout the recording. Priyanka O Yadav   CT Head Wo Contrast Result Date: 07/28/2023 CLINICAL DATA:  Mental status change EXAM: CT HEAD WITHOUT CONTRAST TECHNIQUE: Contiguous axial images were obtained from the base of the skull through the vertex without intravenous contrast. RADIATION DOSE REDUCTION: This exam was performed according to the departmental dose-optimization program which includes automated exposure control, adjustment of the mA and/or kV according to patient size and/or use of iterative reconstruction technique. COMPARISON:  CT 06/09/2023 FINDINGS: Brain: No acute territorial infarction, hemorrhage or intracranial mass. Atrophy and chronic small vessel ischemic changes of the white matter. The ventricles are nonenlarged. Vascular: No hyperdense vessel or unexpected calcification. Skull:  Normal. Negative for fracture or focal lesion. Sinuses/Orbits: No acute finding. Other: None IMPRESSION: 1. No CT evidence for acute intracranial abnormality. 2. Atrophy and chronic small vessel ischemic changes of the white matter. Electronically Signed   By: Luke Bun M.D.   On: 07/28/2023 19:59   DG Chest Portable 1 View Result Date: 07/28/2023 CLINICAL DATA:  Dyspnea. EXAM: PORTABLE CHEST 1 VIEW COMPARISON:  None Available. FINDINGS: Low lung volumes. The heart size and mediastinal contours are within normal limits. 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 No focal consolidation, sizeable pleural effusion, or pneumothorax. No acute osseous abnormality. IMPRESSION: Low lung volumes.  No acute cardiopulmonary findings. Electronically Signed   By: Harrietta Sherry M.D.   On: 07/28/2023 18:58    Microbiology: Results for orders placed or performed during the hospital encounter of 07/28/23  Culture, blood (routine x 2)     Status: None   Collection Time: 07/28/23  7:21 PM   Specimen: BLOOD  Result Value Ref Range Status   Specimen Description BLOOD BLOOD RIGHT HAND  Final  Special Requests   Final    BOTTLES DRAWN AEROBIC ONLY Blood Culture results may not be optimal due to an inadequate volume of blood received in culture bottles   Culture   Final    NO GROWTH 5 DAYS Performed at [facility], [address] [place], [place] [address]    Report Status 08/02/2023 FINAL  Final  Culture, blood (routine x 2)     Status: None   Collection Time: 07/28/23  7:21 PM   Specimen: BLOOD  Result Value Ref Range Status   Specimen Description BLOOD BLOOD LEFT HAND  Final   Special Requests   Final    BOTTLES DRAWN AEROBIC ONLY Blood Culture results may not be optimal due to an inadequate volume of blood received in culture bottles   Culture   Final    NO GROWTH 5 DAYS Performed at [facility], [address], [place], [place] [address]    Report Status 08/02/2023 FINAL  Final     Labs: CBC: Recent Labs  Lab 07/28/23 1833 07/29/23 0356 07/30/23 0704  WBC 6.1 5.2 5.6  HGB 14.1 13.0 13.6  HCT 41.4 38.5* 39.8  MCV 87.3 88.3 86.9  PLT 142* 117* 139*   Basic Metabolic Panel: Recent Labs  Lab 07/28/23 1833 07/29/23 0356 07/30/23 0704 07/31/23 0409 08/01/23 0842  NA 138 140 137 138 141  K 4.4 4.0 3.4* 3.3* 3.9  CL 99 103 101 103 104  CO2 24 27 25 25 26   GLUCOSE 122* 99 119* 110* 92  BUN 19 15 8  <5* 6*  CREATININE 1.29* 0.99 0.82 0.71 0.72  CALCIUM  9.3 8.6* 8.5* 8.5* 9.0  MG  --  1.9  --   --   --   PHOS  --  3.1  --   --   --    Liver Function Tests: Recent Labs  Lab 07/28/23 1833 07/29/23 0356  AST 41 46*  ALT 60* 50*  ALKPHOS 66 57  BILITOT 1.5* 1.2  PROT 6.8 5.9*  ALBUMIN 4.1 3.5   CBG: Recent Labs  Lab 07/28/23 1944  GLUCAP 116*    Discharge time spent: greater than 30 minutes.  Signed: Bernardino KATHEE Come, MD [facility] 08/03/2023

## 2023-08-03 NOTE — Care Management Important Message (Addendum)
 Important Message  Patient Details  Name: Jonathan Poole MRN: [id number] Date of Birth: 1955/08/22   Important Message Given:  Yes - Medicare IM mailed to address on file, unable to reach spouse at [phone]      [name] Ada 08/03/2023, 11:22 AM

## 2023-08-03 NOTE — TOC Transition Note (Signed)
 Transition of Care [facility]) - Discharge Note   Patient Details  Name: Jonathan Poole MRN: [id number] Date of Birth: 06-04-1955  Transition of Care [facility]) CM/SW Contact:  Noreen KATHEE Pinal, LCSWA Phone Number: 08/03/2023, 1:50 PM   Clinical Narrative:     Patient is discharging [facility] to [facility]. Spouse made aware of DC. CSW reached out to Debbie to get room number and number to call report. Information provided to nurse. Med necessity done, will call EMS once report has been made.    Final next level of care: Skilled Nursing Facility Barriers to Discharge: Barriers Resolved   Patient Goals and CMS Choice Patient states their goals for this hospitalization and ongoing recovery are:: DC to SNF CMS Medicare.gov Compare Post Acute Care list provided to:: Patient Represenative (must comment) Isidoro - spouse) Choice offered to / list presented to : Spouse [place] ownership interest in [facility].provided to:: Spouse    Discharge Placement              Patient chooses bed at:  [facility]) Patient to be transferred to facility by: [facility] Name of family member notified: [name] - spouse Patient and family notified of of transfer: 08/03/23  Discharge Plan and Services Additional resources added to the After Visit Summary for   In-house Referral: Clinical Social Work Discharge Planning Services: CM Consult Post Acute Care Choice: Skilled Nursing Facility          DME Arranged: N/A DME Agency: NA                  Social Drivers of Health (SDOH) Interventions SDOH Screenings   Food Insecurity: Patient Unable To Answer (07/29/2023)  Housing: Unknown (07/29/2023)  Transportation Needs: Patient Unable To Answer (07/29/2023)  Utilities: Patient Unable To Answer (07/29/2023)  Alcohol  Screen: Low Risk  (04/10/2023)  Depression (PHQ2-9): Low Risk  (05/21/2023)  Financial Resource Strain: Low Risk  (05/21/2023)  Physical Activity: Inactive (04/10/2023)  Social Connections:  Patient Unable To Answer (07/29/2023)  Stress: No Stress Concern Present (04/10/2023)  Tobacco Use: Medium Risk (07/28/2023)  Health Literacy: Inadequate Health Literacy (04/10/2023)     Readmission Risk Interventions    08/03/2023   11:59 AM  Readmission Risk Prevention Plan  Transportation Screening Complete  Home Care Screening Complete  Medication Review (RN CM) Complete

## 2023-08-03 NOTE — Progress Notes (Signed)
 Mobility Specialist Progress Note:    08/03/23 1504  Mobility  Activity Ambulated with assistance in room  Level of Assistance Minimal assist, patient does 75% or more  Assistive Device None  Distance Ambulated (ft) 10 ft  Range of Motion/Exercises Active;All extremities  Activity Response Tolerated well  Mobility visit 1 Mobility  Mobility Specialist Start Time (ACUTE ONLY) 1450  Mobility Specialist Stop Time (ACUTE ONLY) 1504  Mobility Specialist Time Calculation (min) (ACUTE ONLY) 14 min   Pt received in chair requesting assistance to get up. Required MinA to ambulate with hand held assist. Tolerated well, required many verbal cues. Returned to chair, alarm on. RN in room, all needs met.   Sherrilee Ditty Mobility Specialist Please contact via Special educational needs teacher or  Rehab office at [phone]

## 2023-08-03 NOTE — Plan of Care (Signed)
  Problem: Education: Goal: Knowledge of General Education information will improve Description: Including pain rating scale, medication(s)/side effects and non-pharmacologic comfort measures Outcome: Progressing   Problem: Health Behavior/Discharge Planning: Goal: Ability to manage health-related needs will improve Outcome: Adequate for Discharge   Problem: Activity: Goal: Risk for activity intolerance will decrease Outcome: Adequate for Discharge   Problem: Clinical Measurements: Goal: Ability to maintain clinical measurements within normal limits will improve Outcome: Completed/Met Goal: Will remain free from infection Outcome: Completed/Met Goal: Diagnostic test results will improve Outcome: Completed/Met Goal: Respiratory complications will improve Outcome: Completed/Met Goal: Cardiovascular complication will be avoided Outcome: Completed/Met   Problem: Nutrition: Goal: Adequate nutrition will be maintained Outcome: Completed/Met   Problem: Coping: Goal: Level of anxiety will decrease Outcome: Completed/Met   Problem: Elimination: Goal: Will not experience complications related to bowel motility Outcome: Completed/Met Goal: Will not experience complications related to urinary retention Outcome: Completed/Met   Problem: Pain Managment: Goal: General experience of comfort will improve and/or be controlled Outcome: Completed/Met   Problem: Safety: Goal: Ability to remain free from injury will improve Outcome: Completed/Met   Problem: Skin Integrity: Goal: Risk for impaired skin integrity will decrease Outcome: Completed/Met

## 2023-08-04 DIAGNOSIS — G9341 Metabolic encephalopathy: Secondary | ICD-10-CM | POA: Diagnosis not present

## 2023-08-04 DIAGNOSIS — E43 Unspecified severe protein-calorie malnutrition: Secondary | ICD-10-CM | POA: Diagnosis not present

## 2023-08-04 DIAGNOSIS — M6281 Muscle weakness (generalized): Secondary | ICD-10-CM | POA: Diagnosis not present

## 2023-08-04 DIAGNOSIS — R279 Unspecified lack of coordination: Secondary | ICD-10-CM | POA: Diagnosis not present

## 2023-08-04 DIAGNOSIS — G894 Chronic pain syndrome: Secondary | ICD-10-CM | POA: Diagnosis not present

## 2023-08-04 DIAGNOSIS — F03918 Unspecified dementia, unspecified severity, with other behavioral disturbance: Secondary | ICD-10-CM | POA: Diagnosis not present

## 2023-08-04 DIAGNOSIS — R1312 Dysphagia, oropharyngeal phase: Secondary | ICD-10-CM | POA: Diagnosis not present

## 2023-08-04 DIAGNOSIS — I482 Chronic atrial fibrillation, unspecified: Secondary | ICD-10-CM | POA: Diagnosis not present

## 2023-08-04 DIAGNOSIS — R41841 Cognitive communication deficit: Secondary | ICD-10-CM | POA: Diagnosis not present

## 2023-08-04 DIAGNOSIS — F4322 Adjustment disorder with anxiety: Secondary | ICD-10-CM | POA: Diagnosis not present

## 2023-08-04 DIAGNOSIS — R569 Unspecified convulsions: Secondary | ICD-10-CM | POA: Diagnosis not present

## 2023-08-04 LAB — VITAMIN B1: Vitamin B1 (Thiamine): 201.1 nmol/L — ABNORMAL HIGH (ref 66.5–200.0)

## 2023-08-04 LAB — T3, FREE: T3, Free: 3.5 pg/mL (ref 2.0–4.4)

## 2023-08-04 NOTE — Plan of Care (Signed)
   Problem: Education: Goal: Knowledge of General Education information will improve Description: Including pain rating scale, medication(s)/side effects and non-pharmacologic comfort measures Outcome: Progressing   Problem: Health Behavior/Discharge Planning: Goal: Ability to manage health-related needs will improve Outcome: Progressing   Problem: Activity: Goal: Risk for activity intolerance will decrease Outcome: Progressing

## 2023-08-04 NOTE — Progress Notes (Signed)
 Patient seen this morning, eating breakfast with no complaints. Mental status at baseline. Did not discharge yesterday due to delay with PASRR which has now been received.   Jonathan Poole remains stable for discharge with no changes to discharge summary from 6/23.  Bernardino Come, MD 08/04/2023 10:27 AM

## 2023-08-04 NOTE — Progress Notes (Signed)
 Report called to Isaiah, nurse at [facility]

## 2023-08-04 NOTE — TOC Transition Note (Signed)
 Transition of Care [facility]) - Discharge Note   Patient Details  Name: Jonathan Poole MRN: [id number] Date of Birth: [birth year]  Transition of Care [facility]) CM/SW Contact:  Mcarthur Saddie Kim, LCSW Phone Number: 08/04/2023, 11:40 AM   Clinical Narrative: Pt d/c today to [facility]. Pt's wife and facility aware and agreeable. Debbie at [facility] notified of 30 day PASRR. D/C summary sent to SNF. Authorization received. RN given number to call report.  Discussed transportation with wife who requests [facility]. Zoe waiver on chart. [facility] arranged.     Final next level of care: Skilled Nursing Facility Barriers to Discharge: Barriers Resolved   Patient Goals and CMS Choice Patient states their goals for this hospitalization and ongoing recovery are:: DC to SNF CMS Medicare.gov Compare Post Acute Care list provided to:: Patient Represenative (must comment) Isidoro - spouse) Choice offered to / list presented to : Spouse [place] ownership interest in [facility].provided to:: Spouse    Discharge Placement PASRR number recieved: 08/04/23            Patient chooses bed at:  [facility]) Patient to be transferred to facility by: [facility] Name of family member notified: [name] - spouse Patient and family notified of of transfer: 08/03/23  Discharge Plan and Services Additional resources added to the After Visit Summary for   In-house Referral: Clinical Social Work Discharge Planning Services: CM Consult Post Acute Care Choice: Skilled Nursing Facility          DME Arranged: N/A DME Agency: NA                  Social Drivers of Health (SDOH) Interventions SDOH Screenings   Food Insecurity: Patient Unable To Answer (07/29/2023)  Housing: Unknown (07/29/2023)  Transportation Needs: Patient Unable To Answer (07/29/2023)  Utilities: Patient Unable To Answer (07/29/2023)  Alcohol  Screen: Low Risk  (04/10/2023)  Depression (PHQ2-9): Low Risk  (05/21/2023)  Financial  Resource Strain: Low Risk  (05/21/2023)  Physical Activity: Inactive (04/10/2023)  Social Connections: Patient Unable To Answer (07/29/2023)  Stress: No Stress Concern Present (04/10/2023)  Tobacco Use: Medium Risk (07/28/2023)  Health Literacy: Inadequate Health Literacy (04/10/2023)     Readmission Risk Interventions    08/03/2023   11:59 AM  Readmission Risk Prevention Plan  Transportation Screening Complete  Home Care Screening Complete  Medication Review (RN CM) Complete

## 2023-08-07 DIAGNOSIS — F03918 Unspecified dementia, unspecified severity, with other behavioral disturbance: Secondary | ICD-10-CM | POA: Diagnosis not present

## 2023-08-07 DIAGNOSIS — R569 Unspecified convulsions: Secondary | ICD-10-CM | POA: Diagnosis not present

## 2023-08-07 DIAGNOSIS — F4323 Adjustment disorder with mixed anxiety and depressed mood: Secondary | ICD-10-CM | POA: Diagnosis not present

## 2023-08-10 DIAGNOSIS — G894 Chronic pain syndrome: Secondary | ICD-10-CM | POA: Diagnosis not present

## 2023-08-10 DIAGNOSIS — F03918 Unspecified dementia, unspecified severity, with other behavioral disturbance: Secondary | ICD-10-CM | POA: Diagnosis not present

## 2023-08-10 DIAGNOSIS — E43 Unspecified severe protein-calorie malnutrition: Secondary | ICD-10-CM | POA: Diagnosis not present

## 2023-08-10 DIAGNOSIS — G9341 Metabolic encephalopathy: Secondary | ICD-10-CM | POA: Diagnosis not present

## 2023-08-11 DIAGNOSIS — F03918 Unspecified dementia, unspecified severity, with other behavioral disturbance: Secondary | ICD-10-CM | POA: Diagnosis not present

## 2023-08-11 DIAGNOSIS — R569 Unspecified convulsions: Secondary | ICD-10-CM | POA: Diagnosis not present

## 2023-08-11 DIAGNOSIS — G9341 Metabolic encephalopathy: Secondary | ICD-10-CM | POA: Diagnosis not present

## 2023-08-11 DIAGNOSIS — E43 Unspecified severe protein-calorie malnutrition: Secondary | ICD-10-CM | POA: Diagnosis not present

## 2023-08-12 DIAGNOSIS — G9341 Metabolic encephalopathy: Secondary | ICD-10-CM | POA: Diagnosis not present

## 2023-08-12 DIAGNOSIS — E43 Unspecified severe protein-calorie malnutrition: Secondary | ICD-10-CM | POA: Diagnosis not present

## 2023-08-12 DIAGNOSIS — I482 Chronic atrial fibrillation, unspecified: Secondary | ICD-10-CM | POA: Diagnosis not present

## 2023-08-12 NOTE — Progress Notes (Signed)
 Entered date 7/2: CSW received call from [place] and Brandi with [facility] DSS who state they have been following pts case since hospital admission and are in process of obtaining guardianship. Requested update on what facility pt D/C to and what original hospital admission was for. CSW provided update, they are continuing to keep pts case open and following.

## 2023-08-13 DIAGNOSIS — F03918 Unspecified dementia, unspecified severity, with other behavioral disturbance: Secondary | ICD-10-CM | POA: Diagnosis not present

## 2023-08-13 DIAGNOSIS — R569 Unspecified convulsions: Secondary | ICD-10-CM | POA: Diagnosis not present

## 2023-08-13 DIAGNOSIS — G9341 Metabolic encephalopathy: Secondary | ICD-10-CM | POA: Diagnosis not present

## 2023-08-13 DIAGNOSIS — R1312 Dysphagia, oropharyngeal phase: Secondary | ICD-10-CM | POA: Diagnosis not present

## 2023-08-13 DIAGNOSIS — E43 Unspecified severe protein-calorie malnutrition: Secondary | ICD-10-CM | POA: Diagnosis not present

## 2023-08-20 ENCOUNTER — Emergency Department (HOSPITAL_COMMUNITY)
Admission: EM | Admit: 2023-08-20 | Discharge: 2023-08-20 | Discharge status: Against Medical Advice | Source: Skilled Nursing Facility | Attending: Emergency Medicine | Admitting: Emergency Medicine

## 2023-08-20 ENCOUNTER — Encounter (HOSPITAL_COMMUNITY): Payer: Self-pay

## 2023-08-20 ENCOUNTER — Other Ambulatory Visit: Payer: Self-pay

## 2023-08-20 DIAGNOSIS — F0390 Unspecified dementia without behavioral disturbance: Secondary | ICD-10-CM | POA: Insufficient documentation

## 2023-08-20 DIAGNOSIS — R4182 Altered mental status, unspecified: Secondary | ICD-10-CM | POA: Insufficient documentation

## 2023-08-20 DIAGNOSIS — Z5321 Procedure and treatment not carried out due to patient leaving prior to being seen by health care provider: Secondary | ICD-10-CM | POA: Insufficient documentation

## 2023-08-20 LAB — COMPREHENSIVE METABOLIC PANEL WITH GFR
ALT: 20 U/L (ref 0–44)
AST: 21 U/L (ref 15–41)
Albumin: 3.6 g/dL (ref 3.5–5.0)
Alkaline Phosphatase: 54 U/L (ref 38–126)
Anion gap: 10 (ref 5–15)
BUN: 10 mg/dL (ref 8–23)
CO2: 31 mmol/L (ref 22–32)
Calcium: 9 mg/dL (ref 8.9–10.3)
Chloride: 99 mmol/L (ref 98–111)
Creatinine, Ser: 0.94 mg/dL (ref 0.61–1.24)
GFR, Estimated: >60 mL/min (ref >60)
Glucose, Bld: 118 mg/dL — ABNORMAL HIGH (ref 70–99)
Potassium: 3.2 mmol/L — ABNORMAL LOW (ref 3.5–5.1)
Sodium: 140 mmol/L (ref 135–145)
Total Bilirubin: 0.8 mg/dL (ref 0.0–1.2)
Total Protein: 6.2 g/dL — ABNORMAL LOW (ref 6.5–8.1)

## 2023-08-20 LAB — CBC WITH DIFFERENTIAL/PLATELET
Abs Immature Granulocytes: 0.01 K/uL (ref 0.00–0.07)
Basophils Absolute: 0 K/uL (ref 0.0–0.1)
Basophils Relative: 0 %
Eosinophils Absolute: 0.3 K/uL (ref 0.0–0.5)
Eosinophils Relative: 4 %
HCT: 36.6 % — ABNORMAL LOW (ref 39.0–52.0)
Hemoglobin: 11.6 g/dL — ABNORMAL LOW (ref 13.0–17.0)
Immature Granulocytes: 0 %
Lymphocytes Relative: 19 %
Lymphs Abs: 1.1 K/uL (ref 0.7–4.0)
MCH: 28.6 pg (ref 26.0–34.0)
MCHC: 31.7 g/dL (ref 30.0–36.0)
MCV: 90.1 fL (ref 80.0–100.0)
Monocytes Absolute: 0.5 K/uL (ref 0.1–1.0)
Monocytes Relative: 9 %
Neutro Abs: 4.1 K/uL (ref 1.7–7.7)
Neutrophils Relative %: 68 %
Platelets: 178 K/uL (ref 150–400)
RBC: 4.06 MIL/uL — ABNORMAL LOW (ref 4.22–5.81)
RDW: 12.9 % (ref 11.5–15.5)
WBC: 6.1 K/uL (ref 4.0–10.5)
nRBC: 0 % (ref 0.0–0.2)

## 2023-08-20 LAB — URINALYSIS, ROUTINE W REFLEX MICROSCOPIC
Bilirubin Urine: NEGATIVE
Glucose, UA: NEGATIVE mg/dL
Hgb urine dipstick: NEGATIVE
Ketones, ur: NEGATIVE mg/dL
Leukocytes,Ua: NEGATIVE
Nitrite: NEGATIVE
Protein, ur: NEGATIVE mg/dL
Specific Gravity, Urine: 1.012 (ref 1.005–1.030)
pH: 5 (ref 5.0–8.0)

## 2023-08-20 NOTE — ED Triage Notes (Signed)
 Pt arrived via POV from home with family who report Pt was discharged home from [facility] on Saturday, following recent hospital admission. Per family present in Triage, Pt has been having A/V hallucinations and having conversations with people not present. Per Pts spouse, Pt  does have Hx of toxic metabolic encephalopathy with dementia. Pts spouse also reports Pt has been using the bathroom more frequently, but at the same time, has been experiencing decreased oral intake.  During Triage, Pts spouse interrupts questions being asked to the Pt and does not allow the Pt to speak for himself.

## 2023-08-21 NOTE — Progress Notes (Signed)
 Remote pacemaker transmission.

## 2023-08-26 DIAGNOSIS — G300 Alzheimer's disease with early onset: Secondary | ICD-10-CM | POA: Diagnosis not present

## 2023-08-26 DIAGNOSIS — F02B11 Dementia in other diseases classified elsewhere, moderate, with agitation: Secondary | ICD-10-CM | POA: Diagnosis not present

## 2023-08-26 DIAGNOSIS — Z515 Encounter for palliative care: Secondary | ICD-10-CM | POA: Diagnosis not present

## 2023-08-28 ENCOUNTER — Telehealth: Payer: Self-pay | Admitting: Internal Medicine

## 2023-08-28 ENCOUNTER — Other Ambulatory Visit: Payer: Self-pay

## 2023-08-28 DIAGNOSIS — I480 Paroxysmal atrial fibrillation: Secondary | ICD-10-CM

## 2023-08-28 MED ORDER — APIXABAN 5 MG PO TABS: 5.0000 mg | ORAL_TABLET | Freq: Two times a day (BID) | ORAL | 0 refills | Status: DC

## 2023-08-28 NOTE — Telephone Encounter (Signed)
 08/28/2023 NO ANSWER - patient is past due for appointment with Dr. Waddell - per [facility] needs to have fu to continue receiving his medications.

## 2023-08-28 NOTE — Telephone Encounter (Signed)
 Prescription refill request for Eliquis  received. Indication:AFIB Last office visit:needs appt Scr:na Age: 68 Weight:71.2  kg  Prescription refilled

## 2023-09-14 DIAGNOSIS — G8929 Other chronic pain: Secondary | ICD-10-CM | POA: Diagnosis not present

## 2023-09-14 DIAGNOSIS — I482 Chronic atrial fibrillation, unspecified: Secondary | ICD-10-CM | POA: Diagnosis not present

## 2023-09-14 DIAGNOSIS — F02C0 Dementia in other diseases classified elsewhere, severe, without behavioral disturbance, psychotic disturbance, mood disturbance, and anxiety: Secondary | ICD-10-CM | POA: Diagnosis not present

## 2023-09-14 DIAGNOSIS — Z7901 Long term (current) use of anticoagulants: Secondary | ICD-10-CM | POA: Diagnosis not present

## 2023-09-14 DIAGNOSIS — G309 Alzheimer's disease, unspecified: Secondary | ICD-10-CM | POA: Diagnosis not present

## 2023-09-18 ENCOUNTER — Encounter: Payer: Self-pay | Admitting: *Deleted

## 2023-09-18 ENCOUNTER — Telehealth: Payer: Self-pay | Admitting: *Deleted

## 2023-09-18 ENCOUNTER — Telehealth: Payer: Self-pay

## 2023-09-18 NOTE — Patient Outreach (Signed)
 Complex Care Management   Visit Note  09/18/2023 late note for 07/21/23  Name:  Jonathan Poole MRN: [id number] DOB: [birth year]  Situation: Referral received for Complex Care Management related to Dementia, Atrial Fibrillation, and mixed hyperlipidemia + h/o cardiac pacemaker I obtained verbal consent from Caregiver Patient.  Visit completed with Jonathan & Jonathan Poole  in the office to received assistance with completing Power of Attorney [facility]) forms for the patient and wife. They received notarization of these forms from a local notary,   Background:   Past Medical History:  Diagnosis Date   Atrial fibrillation (HCC)    Dementia (HCC)    Heart disease    Hyperchloremia    Seizures (HCC)     Assessment: Patient Reported Symptoms:  Cognitive Cognitive Status: Difficulties with attention and concentration, Requires Assistance Decision Making      Neurological Neurological Review of Symptoms: Not assessed    HEENT HEENT Symptoms Reported: Not assessed      Cardiovascular Cardiovascular Symptoms Reported: No symptoms reported Does patient have uncontrolled Hypertension?: No Cardiovascular Management Strategies: Adequate rest, Medical device, Routine screening Cardiovascular Self-Management Outcome: 4 (good)  Respiratory Respiratory Symptoms Reported: No symptoms reported Respiratory Self-Management Outcome: 4 (good)  Endocrine Endocrine Symptoms Reported: Not assessed    Gastrointestinal Gastrointestinal Symptoms Reported: Not assessed   Nutrition Risk Screen (CP): No indicators present  Genitourinary Genitourinary Symptoms Reported: Not assessed    Integumentary Integumentary Symptoms Reported: No symptoms reported Skin Self-Management Outcome: 4 (good)  Musculoskeletal Musculoskelatal Symptoms Reviewed: Unsteady gait Musculoskeletal Self-Management Outcome: 3 (uncertain) Falls in the past year?: Yes Number of falls in past year: 2 or more Was there an injury with Fall?:  Yes Fall Risk Category Calculator: 3 Patient Fall Risk Level: High Fall Risk Patient at Risk for Falls Due to: Impaired balance/gait, History of fall(s), Mental status change Fall risk Follow up: Falls evaluation completed, Falls prevention discussed  Psychosocial Psychosocial Symptoms Reported: Difficulty concentrating, Flat affect Behavioral Health Self-Management Outcome: 3 (uncertain) Major Change/Loss/Stressor/Fears (CP): Medical condition, self, Resources Quality of Family Relationships: helpful, supportive Do you feel physically threatened by others?: No      05/21/2023    7:58 PM  Depression screen PHQ 2/9  Trouble concentrating 1  Moving slowly or fidgety/restless 1  Suicidal thoughts 0  Difficult doing work/chores Somewhat difficult    There were no vitals filed for this visit.  Medications Reviewed Today   Medications were not reviewed in this encounter     Recommendation:   PCP Follow-up Continue Current Plan of Care   Follow Up Plan:   Telephone follow up appointment date/time:  09/18/23 1 pm  Jonathan Poole L. Ramonita, RN, BSN, CCM [place]  [facility], [facility] Health RN Care Manager Direct Dial: [phone]  Fax: [phone]

## 2023-09-18 NOTE — Patient Instructions (Signed)
 Jonathan Poole - I am sorry I was unable to reach you today for our scheduled appointment. I work with [name], [name], MD and am calling to support your healthcare needs. Please contact me at [phone] at your earliest convenience. I look forward to speaking with you soon.   Thank you,  [name], RN, BSN, CCM [place]  [facility], [facility] Health RN Care Manager Direct Dial: [phone]  Fax: [phone]

## 2023-09-18 NOTE — Progress Notes (Signed)
 Complex Care Management Care Guide Note  09/18/2023 Name: Jonathan Poole MRN: [id number] DOB: July 16, 1955  Jonathan Poole is a 68 y.o. year old male who is a primary care patient of [name], [name], MD and is actively engaged with the care management team. I reached out to Jonathan Poole by phone today to assist with re-scheduling  with the RN Case Manager.  Follow up plan: Unsuccessful telephone outreach attempt made. A HIPAA compliant phone message was left for the patient providing contact information and requesting a return call.  Jonathan Poole [facility], [facility] Guide  Direct Dial: [phone]  Fax [phone]

## 2023-09-18 NOTE — Patient Instructions (Signed)
 Visit Information  Thank you for taking time to visit with me today. Please don't hesitate to contact me if I can be of assistance to you before our next scheduled appointment.  Your next care management appointment is by telephone on 09/18/23 at 1 pm  Please call as needed   Please call the care guide team at [phone] if you need to cancel, schedule, or reschedule an appointment.   Please call the Suicide and Crisis Lifeline: 988 call the [place]  National Suicide Prevention Lifeline: 7860514298 or TTY: 910-568-3758 TTY 918-834-4150) to talk to a trained counselor call 1-800-273-TALK (toll free, 24 hour hotline) call the [facility]: [phone] call 911 if you are experiencing a Mental Health or Behavioral Health Crisis or need someone to talk to.  Meeka Cartelli L. Ramonita, RN, BSN, CCM [place]  [facility], [facility] Health RN Care Manager Direct Dial: [phone]  Fax: [phone]

## 2023-09-22 ENCOUNTER — Telehealth: Payer: Self-pay | Admitting: *Deleted

## 2023-09-22 ENCOUNTER — Encounter: Payer: Self-pay | Admitting: *Deleted

## 2023-09-22 NOTE — Patient Outreach (Signed)
 Collaboration with pcp- home services   Spoke with D Huffman about case information/update  RN CM called to speak with [facility] staff to confirm Mr Zima home health services ended I May 2025   Left a voice message for Barnie Gayle Cella SW [phone]  Plan 10/02/23 call attempt to wife & patient- updated pcp    [name], RN, BSN, CCM [place]  [facility], [facility] Health RN Care Manager Direct Dial: [phone]  Fax: [phone]

## 2023-09-29 ENCOUNTER — Ambulatory Visit: Payer: Medicare HMO | Attending: Cardiology

## 2023-09-29 DIAGNOSIS — I495 Sick sinus syndrome: Secondary | ICD-10-CM

## 2023-09-29 LAB — CUP PACEART REMOTE DEVICE CHECK
Battery Remaining Longevity: 78 mo
Battery Remaining Percentage: 66 %
Battery Voltage: 2.99 V
Brady Statistic AP VP Percent: 1.1 %
Brady Statistic AP VS Percent: 93 %
Brady Statistic AS VP Percent: 1 %
Brady Statistic AS VS Percent: 5.6 %
Brady Statistic RA Percent Paced: 93 %
Brady Statistic RV Percent Paced: 1.1 %
Date Time Interrogation Session: 20250819040021
Implantable Lead Connection Status: 753985
Implantable Lead Connection Status: 753985
Implantable Lead Implant Date: 20100504
Implantable Lead Implant Date: 20100524
Implantable Lead Location: 753859
Implantable Lead Location: 753860
Implantable Pulse Generator Implant Date: 20211122
Lead Channel Impedance Value: 410 Ohm
Lead Channel Impedance Value: 550 Ohm
Lead Channel Pacing Threshold Amplitude: 0.75 V
Lead Channel Pacing Threshold Amplitude: 1 V
Lead Channel Pacing Threshold Pulse Width: 0.3 ms
Lead Channel Pacing Threshold Pulse Width: 0.7 ms
Lead Channel Sensing Intrinsic Amplitude: 5 mV
Lead Channel Sensing Intrinsic Amplitude: 5.1 mV
Lead Channel Setting Pacing Amplitude: 1.25 V
Lead Channel Setting Pacing Amplitude: 1.5 V
Lead Channel Setting Pacing Pulse Width: 0.7 ms
Lead Channel Setting Sensing Sensitivity: 0.7 mV
Pulse Gen Model: 2272
Pulse Gen Serial Number: 3864825

## 2023-10-02 ENCOUNTER — Encounter: Payer: Self-pay | Admitting: *Deleted

## 2023-10-02 ENCOUNTER — Telehealth: Payer: Self-pay | Admitting: *Deleted

## 2023-10-02 NOTE — Patient Instructions (Signed)
 Katelyn A Creary - I am sorry I was unable to reach you today for our scheduled appointment. I work with [name], [name], MD and am calling to support your healthcare needs. Please contact me at [phone] at your earliest convenience. I look forward to speaking with you soon.   Thank you,  [name], RN, BSN, CCM [place]  [facility], [facility] Health RN Care Manager Direct Dial: [phone]  Fax: [phone]

## 2023-10-04 ENCOUNTER — Ambulatory Visit: Payer: Self-pay | Admitting: Internal Medicine

## 2023-10-06 NOTE — Progress Notes (Signed)
 Complex Care Management Care Guide Note  10/06/2023 Name: Jonathan Poole MRN: [id number] DOB: Nov 24, 1955  Jonathan Poole is a 68 y.o. year old male who is a primary care patient of [name], [name], MD and is actively engaged with the care management team. I reached out to Samrat A Kramar by phone today to assist with re-scheduling  with the RN Case Manager.  Follow up plan: Unsuccessful telephone outreach attempt made. A HIPAA compliant phone message was left for the patient providing contact information and requesting a return call. No Answer-Unable to leave a message  Leotis Rase [facility], [facility] Guide  Direct Dial: [phone]  Fax [phone]

## 2023-10-20 ENCOUNTER — Telehealth: Payer: Self-pay | Admitting: *Deleted

## 2023-10-20 ENCOUNTER — Encounter: Payer: Self-pay | Admitting: *Deleted

## 2023-10-20 NOTE — Patient Outreach (Signed)
 Social Work Follow-Up Summary:  Received a follow-up call from Jonathan Poole, Social Worker, in response to a voice message previously left by the Jonathan Poole. Ms. Poole confirmed that Jonathan Poole's Adult Protective Services (APS) case has been officially closed. Closure was based on observed improvements in the care provided by his wife, as well as acknowledgment that there are currently no financial resources available to support placement in a care facility.  Plan another follow up attempt on 11/02/23 3:30 pm  Anab Vivar L. Ramonita, RN, BSN, CCM [place]  [facility], [facility] Health RN Care Manager Direct Dial: [phone]  Fax: [phone]

## 2023-10-20 NOTE — Patient Outreach (Signed)
 Collaboration with [facility]BCI & ACP staff  Sent copy of patient's Advance directives listed in pcp EMR to [facility] ACP staff to enter information in EPIC  Plan follow up 11/02/23 3:30 pm   Connell Bognar L. Ramonita, RN, BSN, CCM [place]  [facility], [facility] Health RN Care Manager Direct Dial: [phone]  Fax: [phone]

## 2023-11-02 ENCOUNTER — Other Ambulatory Visit: Payer: Self-pay | Admitting: *Deleted

## 2023-11-02 DIAGNOSIS — I482 Chronic atrial fibrillation, unspecified: Secondary | ICD-10-CM

## 2023-11-02 DIAGNOSIS — R451 Restlessness and agitation: Secondary | ICD-10-CM

## 2023-11-02 DIAGNOSIS — F0280 Dementia in other diseases classified elsewhere without behavioral disturbance: Secondary | ICD-10-CM

## 2023-11-02 DIAGNOSIS — G894 Chronic pain syndrome: Secondary | ICD-10-CM

## 2023-11-02 DIAGNOSIS — F03918 Unspecified dementia, unspecified severity, with other behavioral disturbance: Secondary | ICD-10-CM

## 2023-11-02 DIAGNOSIS — R296 Repeated falls: Secondary | ICD-10-CM

## 2023-11-02 DIAGNOSIS — G40209 Localization-related (focal) (partial) symptomatic epilepsy and epileptic syndromes with complex partial seizures, not intractable, without status epilepticus: Secondary | ICD-10-CM

## 2023-11-02 NOTE — Patient Outreach (Signed)
 Complex Care Management   Visit Note  11/16/2023 updated notes for 11/02/23   Name:  Jonathan Poole MRN: [id number] DOB: 21-Mar-1955  Situation: Referral received for Complex Care Management related to Dementia, Atrial Fibrillation, and mixed hyperlipidemia I obtained verbal consent from Caregiver.  Visit completed with Caregiver  on the phone  Wife reports everything remains the same over all She reports safety measures are being completed for patient like locks to prevent him from going out of the home via the front or back door He is noted to be aggressive at least 1- 2 times a week  Passed out a few days ago when they were coming in from outside. Slept on the floor as he refused to get up  Spouse provides plenty of fluids Confirms he has been on dementia medicines for about 4 years  Appetite change includes  more snack, poor appetite He looks like he has lost weight per wife. At pcp he was informed of weight  loss   Noted when he is tired he sits and stares, unresponsive His language is very vulgar when interacting with her and the son at least 1-2 times a day   This is making the wife anxious and sad   He does not prefer to go out of the home Has not been to neurologist in awhile Sleeps [name], most of the night, confuses morning for night Does not qualify for skilled nursing and Medicald   APS has closed case after they went to court. Jonathan Poole is now appointed as the patient's guardian. The Advance directives was completed prior to this court date  Wife having caregiver stress She withdraws, hang out with dog outside or run an errand. She and son are not eating properly as they are not freely going about their home. Son and spouse lives out of their bedroom  Son is applying for disability and waiting.  Has missed appointments with his pain management providers therefore no longer seeing pain management MD. Has not seen Neurology in a while.  Voiced understanding of  3  remaining patient appointments for 2025 (to get labs, see pcp & see cardiology)  Jonathan Poole is not sleeping well, stays anxious. Her pcp hs discussed counseling with her but she has not preferred to started yet . She voiced understanding of RN CM's encouragement to start counseling and to see if the patient and son are interest would help. She has appreciated speaking with this RN CM for outreaches.  She has heard of respite care but has not been assisted  She would like to be updated and educated on respite care   Agree to follow up appointment and change of RN CM in October 2025   Background:   Past Medical History:  Diagnosis Date   Atrial fibrillation (HCC)    Dementia (HCC)    Heart disease    Hyperchloremia    Seizures (HCC)     Assessment: Patient Reported Symptoms:  Cognitive Cognitive Status: Poor judgment in daily scenarios, Struggling with memory recall, Requires Assistance Decision Making, Difficulties with attention and concentration Cognitive/Intellectual Conditions Management [RPT]: None reported or documented in medical history or problem list      Neurological Neurological Review of Symptoms: Weakness Neurological Management Strategies: Activity, Adequate rest, Coping strategies, Medication therapy, Routine screening Neurological Self-Management Outcome: 3 (uncertain)  HEENT HEENT Symptoms Reported: No symptoms reported HEENT Management Strategies: Activity, Adequate rest, Coping strategies, Routine screening HEENT Self-Management Outcome: 4 (good) Ear problem(s), Vision problem(s)  Cardiovascular  Cardiovascular Symptoms Reported: Fainting Does patient have uncontrolled Hypertension?: No Cardiovascular Management Strategies: Activity, Adequate rest, Coping strategies, Medical device, Medication therapy, Routine screening Weight:  (has not weighed at home in a while Last weight a pcp) Cardiovascular Self-Management Outcome: 3 (uncertain)  Respiratory Respiratory  Symptoms Reported: No symptoms reported Respiratory Management Strategies: Activity, Adequate rest, Routine screening Respiratory Self-Management Outcome: 4 (good)  Endocrine Endocrine Symptoms Reported: Irritability, Unintentional weight loss Is patient diabetic?: No Endocrine Self-Management Outcome: 3 (uncertain)  Gastrointestinal Gastrointestinal Symptoms Reported: Change in appetite, Unintentional weight loss Gastrointestinal Management Strategies: Activity, Adequate rest, Medication therapy Gastrointestinal Self-Management Outcome: 3 (uncertain)    Genitourinary Genitourinary Symptoms Reported: Incontinence Genitourinary Management Strategies: Incontinence garment/pad Genitourinary Self-Management Outcome: 3 (uncertain)  Integumentary Integumentary Symptoms Reported: No symptoms reported Additional Integumentary Details: He does wash but does not allow assistance wiht a shower    Musculoskeletal Musculoskelatal Symptoms Reviewed: Unsteady gait Other Musculoskeletal Symptoms: history of chronic painHas eisodes of pass out once a week Musculoskeletal Management Strategies: Adequate rest Musculoskeletal Self-Management Outcome: 3 (uncertain) Falls in the past year?: Yes Number of falls in past year: 2 or more Was there an injury with Fall?: Yes Fall Risk Category Calculator: 3 Patient Fall Risk Level: High Fall Risk Patient at Risk for Falls Due to: History of fall(s), Impaired balance/gait, Impaired mobility, Mental status change Fall risk Follow up: Falls evaluation completed, Falls prevention discussed  Psychosocial Psychosocial Symptoms Reported: Alteration in eating habits, Difficulty concentrating, Anger          11/16/2023    PHQ2-9 Depression Screening   Little interest or pleasure in doing things Several days  Feeling down, depressed, or hopeless Several days  PHQ-2 - Total Score 2  Trouble falling or staying asleep, or sleeping too much Several days  Feeling tired  or having little energy Several days  Poor appetite or overeating  Several days  Feeling bad about yourself - or that you are a failure or have let yourself or your family down Not at all  Trouble concentrating on things, such as reading the newspaper or watching television More than half the days  Moving or speaking so slowly that other people could have noticed.  Or the opposite - being so fidgety or restless that you have been moving around [name] more than usual Several days  Thoughts that you would be better off dead, or hurting yourself in some way Not at all  PHQ2-9 Total Score 8  If you checked off any problems, how difficult have these problems made it for you to do your work, take care of things at home, or get along with other people Somewhat difficult  Depression Interventions/Treatment Medication, Currently on Treatment    There were no vitals filed for this visit.  Medications Reviewed Today     Reviewed by Ramonita Suzen CROME, RN (Registered Nurse) on 11/02/23 at 1551  Med List Status: <None>   Medication Order Taking? Sig Documenting Provider Last Dose Status Informant  acetaminophen  (TYLENOL ) 500 MG tablet [id number] Yes Take 500-1,000 mg by mouth every 6 (six) hours as needed (when not taking Ibuprofen). [provider]  Active Spouse/Significant Other, Pharmacy Records  apixaban  (ELIQUIS ) 5 MG TABS tablet [id number] Yes Take 1 tablet (5 mg total) by mouth 2 (two) times daily. NEEDS CARDIOLOGY APPT, CALL OFFICE [phone].  THANK ROSINE Waddell Danelle LELON, MD  Active   artificial tears ophthalmic solution [id number]  Place 1 drop into the right eye as needed for dry eyes.  Bryn Bernardino NOVAK, MD  Active   Buprenorphine  HCl-Naloxone  HCl 4-1 MG FILM [id number]  Place 1 Film under the tongue daily. [provider]  Active   cyanocobalamin  (VITAMIN B12) 1000 MCG/ML injection [id number]  Inject 1,000 mcg into the muscle every 30 (thirty) days. [provider]  Active  Spouse/Significant Other, Pharmacy Records  donepezil  (ARICEPT ) 10 MG tablet [id number] Yes TAKE 1 TABLET AT BEDTIME Tobie Suzzane POUR, MD  Active Spouse/Significant Other, Pharmacy Records  feeding supplement (ENSURE PLUS HIGH PROTEIN) LIQD [id number]  Take 237 mLs by mouth 3 (three) times daily between meals. Bryn Bernardino NOVAK, MD  Active   gabapentin  (NEURONTIN ) 300 MG capsule [id number]  Take 300 mg by mouth 3 (three) times daily. [provider]  Active   Ibuprofen 200 MG CAPS [id number]  Take 800 mg by mouth every 6 (six) hours as needed (for pain- when not taking Tylenol ). [provider]  Active Spouse/Significant Other, Pharmacy Records  levETIRAcetam  (KEPPRA ) 1000 MG tablet [id number]  Take 1 tablet (1,000 mg total) by mouth 2 (two) times daily.  Patient not taking: Reported on 11/02/2023   Bryn Bernardino NOVAK, MD  Active Spouse/Significant Other, Pharmacy Records  memantine  (NAMENDA ) 10 MG tablet [id number] Yes Take 10 mg by mouth in the morning and at bedtime. [provider]  Active Spouse/Significant Other, Pharmacy Records  Multiple Vitamin (MULTIVITAMIN WITH MINERALS) TABS tablet [id number]  Take 1 tablet by mouth daily. Bryn Bernardino NOVAK, MD  Active   naloxone  (NARCAN ) nasal spray 4 mg/0.1 mL [id number]  Place 1 spray into the nose once as needed (OD). [provider]  Active Spouse/Significant Other, Pharmacy Records  QUEtiapine (SEROQUEL) 50 MG tablet [id number]  Take 50 mg by mouth 3 (three) times daily as needed (mood). [provider]  Active Spouse/Significant Other, Pharmacy Records  REXULTI  3 MG TABS [id number] Yes Take 3 mg by mouth in the morning. [provider]  Active Spouse/Significant Other, Pharmacy Records  rosuvastatin  (CRESTOR ) 20 MG tablet [id number]  TAKE 1 TABLET ONE TIME DAILY  Patient taking differently: Take 20 mg by mouth in the morning.   Tobie Suzzane POUR, MD  Active Spouse/Significant Other, Pharmacy Records  thiamine  250 MG TABS  [id number]  Take 500 mg by mouth daily. Bryn Bernardino NOVAK, MD  Active   tiZANidine  (ZANAFLEX ) 4 MG tablet [id number]  Take 1 tablet (4 mg total) by mouth 3 (three) times daily.  Patient taking differently: Take 4 mg by mouth every 6 (six) hours as needed for muscle spasms.   Zarwolo, Gloria, FNP  Active Spouse/Significant Other, Pharmacy Records  tobramycin  (TOBREX ) 0.3 % ophthalmic solution [id number]  Place 1 drop into the right eye every 6 (six) hours. Bryn Bernardino NOVAK, MD  Active             Recommendation:   PCP Follow-up  Follow Up Plan:   Telephone follow up appointment date/time:  [place] Her 12/25/23 3 pm   [name], RN, BSN, CCM [place]  [facility], [facility] Health RN Care Manager Direct Dial: [phone]  Fax: [phone]

## 2023-11-03 ENCOUNTER — Telehealth: Payer: Self-pay

## 2023-11-03 NOTE — Progress Notes (Unsigned)
 Complex Care Management Note Care Guide Note  11/03/2023 Name: BRITTAN MAPEL MRN: [id number] DOB: [birth year]   Complex Care Management Outreach Attempts: An unsuccessful telephone outreach was attempted today to offer the patient information about available complex care management services.  Follow Up Plan:  Additional outreach attempts will be made to offer the patient complex care management information and services.   Encounter Outcome:  No Answer-Left Voicemail  Leotis Rase [facility], [facility] Guide  Direct Dial: [phone]  Fax [phone]

## 2023-11-04 NOTE — Progress Notes (Signed)
 Remote PPM Transmission

## 2023-11-04 NOTE — Progress Notes (Signed)
 Complex Care Management Note  Care Guide Note 11/04/2023 Name: Jonathan Poole MRN: [id number] DOB: 29-Apr-1955  Jonathan Poole is a 68 y.o. year old male who sees [name], [name], MD for primary care. I reached out to Dillard A Willison by phone today to offer complex care management services.  [name] was given information about Complex Care Management services today including:   The Complex Care Management services include support from the care team which includes your Nurse Care Manager, Clinical Social Worker, or Pharmacist.  The Complex Care Management team is here to help remove barriers to the health concerns and goals most important to you. Complex Care Management services are voluntary, and the patient may decline or stop services at any time by request to their care team member.   Complex Care Management Consent Status: Patient agreed to services and verbal consent obtained.   Follow up plan:  Telephone appointment with complex care management team member scheduled for:  11/06/23 @ 3 PM  Encounter Outcome:  Patient Scheduled  Leotis Rase [facility], [facility] Guide  Direct Dial: [phone]  Fax [phone]

## 2023-11-06 ENCOUNTER — Other Ambulatory Visit: Payer: Self-pay

## 2023-11-06 NOTE — Patient Outreach (Signed)
 Complex Care Management   Visit Note  11/06/2023  Name:  Jonathan Poole MRN: [id number] DOB: August 11, 1955  Situation: Referral received for Complex Care Management related to SDOH Barriers:  Respite I obtained verbal consent from Caregiver.  Visit completed with Caregiver  on the phone  Background:   Past Medical History:  Diagnosis Date   Atrial fibrillation (HCC)    Dementia (HCC)    Heart disease    Hyperchloremia    Seizures (HCC)     Assessment:  Patients wife provides care and needs assistance. SW t/c [employer] and was informed they do not assist and directed to Medicare. SW t/c Medicare but there is a long hold. SW t/c ADTS and left a message for Roselie to contact patients wife to apply for assistance. SW provided wife with [facility] care program 4402197120 to be screened. Assessment will be completed at the next visit.    Recommendation:   None  Follow Up Plan:   Telephone follow up appointment date/time:  11/09/23 at 11am  Tillman Gardener, BSW [place]  [facility] Social Worker Direct Dial: [phone]  Fax: [phone] Website: [email or web address]

## 2023-11-06 NOTE — Patient Instructions (Signed)
 Visit Information  Thank you for taking time to visit with me today. Please don't hesitate to contact me if I can be of assistance to you before our next scheduled appointment.  Our next appointment is by telephone on 11/09/23 at 11am  Please call the care guide team at [phone] if you need to cancel or reschedule your appointment.   Following is a copy of your care plan:   Goals Addressed             This Visit's Progress    BSW VBCI Social Work Care Plan       Problems:   Respite Services  CSW Clinical Goal(s):   Over the next 7 days the Caregiver will work with community resources to address needs related to Respite.  Interventions:  Social Determinants of Health in Patient with Atrial Fibrillation and alzehimers: SDOH assessments completed: Respite Evaluation of current treatment plan related to unmet needs Patients wife provides care and needs assistance.  SW t/c [employer] and was informed they do not assist and directed to Medicare.  SW t/c Medicare but there is a long hold.  SW t/c ADTS and left a message for Roselie to contact patients wife to apply for assistance.  SW provided wife with [facility] Family Support-Project care program [phone] to be screened.  Patient Goals/Self-Care Activities:  Patient will contact community resources for assistance.  Plan:   Telephone follow up appointment with care management team member scheduled for:  11/09/23 at 11:00am        Please call 911 if you are experiencing a Mental Health or Behavioral Health Crisis or need someone to talk to.  Patient verbalizes understanding of instructions and care plan provided today and agrees to view in MyChart. Active MyChart status and patient understanding of how to access instructions and care plan via MyChart confirmed with patient.     Tillman Gardener, BSW   [facility], [facility] Social Worker Direct Dial: [phone]  Fax:  [phone] Website: [email or web address]

## 2023-11-09 ENCOUNTER — Telehealth: Payer: Self-pay

## 2023-11-09 NOTE — Patient Instructions (Signed)
 Errik A Berk - I am sorry I was unable to reach you today for our scheduled appointment. I work with [name], [name], MD and am calling to support your healthcare needs. Please contact me at [phone] at your earliest convenience. I look forward to speaking with you soon.   Thank you,  Tillman Gardener, BSW [place]  [facility], [facility] Social Worker Direct Dial: [phone]  Fax: [phone] Website: [email or web address]    (signature)

## 2023-11-10 ENCOUNTER — Telehealth: Payer: Self-pay

## 2023-11-11 ENCOUNTER — Other Ambulatory Visit

## 2023-11-11 NOTE — Patient Outreach (Signed)
 Complex Care Management   Visit Note  11/11/2023  Name:  Jonathan Poole MRN: [id number] DOB: 09/22/1955  Situation: Referral received for Complex Care Management related to SDOH Barriers:  Respite I obtained verbal consent from Caregiver.  Visit completed with Caregiver  on the phone  Background:   Past Medical History:  Diagnosis Date   Atrial fibrillation (HCC)    Dementia (HCC)    Heart disease    Hyperchloremia    Seizures (HCC)     Assessment:  Patients wife calls requesting visit with BSW due to no show 11/10/23. Patients wife has not contacted resources for Respite. SW t/c Medicare (patient provides consent) and patient is eligible for Respite. Patient qualifies for 5 days at a time for Respite. Provider must complete an FL-2 and submit to Home Health Agency. SW faxed FL-2 to provider. SW and Medicare explain the difference in Respite and Long Term Care. SW educated wife on the purpose of Respite. Wife reports patient has declined over the past 5 days and is not responding. SW encourages wife to contact the primary care doctor once the visit ends to speak to a nurse. Wife agreed to call md. Assessment will be completed at next visit.   SDOH Interventions    Flowsheet Row Patient Outreach from 05/21/2023 in [place] POPULATION HEALTH DEPARTMENT Care Coordination from 04/10/2023 in [facility] Community Care Coordination Care Coordination from 01/06/2023 in [facility] Community Care Coordination Care Coordination from 12/29/2022 in [facility] Community Care Coordination Care Coordination from 09/11/2022 in [facility] Community Care Coordination Chronic Care Management from 03/17/2022 in [facility]  SDOH Interventions        Food Insecurity Interventions -- Intervention Not Indicated Intervention Not Indicated Intervention Not Indicated Intervention Not Indicated  [Verified by Wife, [name] Solum]  Intervention Not Indicated  Housing Interventions -- Intervention Not Indicated -- Intervention Not Indicated Intervention Not Indicated  [Verified by Wife, [name] Morain] Intervention Not Indicated  Transportation Interventions Intervention Not Indicated Intervention Not Indicated, Community Resources Provided, Patient Resources (Friends/Family) Intervention Not Indicated Intervention Not Indicated, Patient Resources (Friends/Family) Intervention Not Indicated, Patient Resources (Friends/Family)  [Verified by Wife, [name] Googe] Intervention Not Indicated  Utilities Interventions -- Intervention Not Indicated Intervention Not Indicated Intervention Not Indicated Intervention Not Indicated  [Verified by Wife, [name] Mitchelle] Intervention Not Indicated  Alcohol  Usage Interventions -- Intervention Not Indicated (Score <7) -- Intervention Not Indicated (Score <7) Intervention Not Indicated (Score <7)  [Verified by Wife, [name] Helser] --  Depression Interventions/Treatment  Medication, Currently on Treatment -- -- -- -- --  Financial Strain Interventions Intervention Not Indicated Intervention Not Indicated -- Intervention Not Indicated Intervention Not Indicated  [Verified by Wife, [name] Menz] Intervention Not Indicated  Physical Activity Interventions -- Patient Declined -- Patient Declined Intervention Not Indicated, Patient Declined  [Verified by Wife, [name] Barretto] Patient Refused  Stress Interventions -- Intervention Not Indicated -- Intervention Not Indicated Intervention Not Indicated  [Verified by Wife, [name] Kubitz] Intervention Not Indicated  Social Connections Interventions -- Intervention Not Indicated -- Intervention Not Indicated Intervention Not Indicated, Patient Declined  [Verified by Wife, [name] Wauneka] Patient Refused  Health Literacy Interventions -- Intervention Not Indicated Other (Comment)  [dementia] Intervention Not Indicated --  [Verified by Wife, [name] Hestand] --       Recommendation:   SW faxed provider FL-2 to complete for Respite.  Follow Up Plan:   Telephone follow up appointment date/time:  11/18/23 at 1pm  Tillman Gardener, BSW [place]  [facility], [facility] Social Worker Direct Dial: [phone]  Fax: [phone] Website: [email or web address]

## 2023-11-16 NOTE — Patient Instructions (Signed)
 Visit Information  Thank you for taking time to visit with me today. Please don't hesitate to contact me if I can be of assistance to you before our next scheduled appointment.  Your next care management appointment is by telephone on 12/25/23 at 3 pm  With Hendricks Her RN CM [phone]   Please call the care guide team at [phone] if you need to cancel, schedule, or reschedule an appointment.   Please call the Suicide and Crisis Lifeline: 988 call the [place]  National Suicide Prevention Lifeline: 480-394-8912 or TTY: 269-355-8197 TTY (717)044-8096) to talk to a trained counselor call 1-800-273-TALK (toll free, 24 hour hotline) call the [facility]: [phone] call 911 if you are experiencing a Mental Health or Behavioral Health Crisis or need someone to talk to.  Raneen Jaffer L. Ramonita, RN, BSN, CCM [place]  [facility], [facility] Health RN Care Manager Direct Dial: [phone]  Fax: [phone]

## 2023-11-18 ENCOUNTER — Other Ambulatory Visit: Payer: Self-pay

## 2023-11-18 NOTE — Patient Outreach (Addendum)
 Complex Care Management   Visit Note  11/18/2023  Name:  Jonathan Poole MRN: [id number] DOB: 13-Aug-1955  Situation: Referral received for Complex Care Management related to SDOH Barriers:  Respite I obtained verbal consent from Wife [name] Inocencio.  Visit completed with wife Kolbe Delmonaco  on the phone  Background:   Past Medical History:  Diagnosis Date   Atrial fibrillation (HCC)    Dementia (HCC)    Heart disease    Hyperchloremia    Seizures (HCC)     Assessment:  Patients wife reports she contact [facility] for Project Care and is waiting for a call back to complete the assessment. SW received the FL-2 but does not see notes on if the provider sent it to a Home Health provider. SW will contact provider to submit to [facility] and update form to not requesting Respite. Patient continues to decline and wife called providers office and is waiting for a call back. SW will mention medical concerns when contacting provider.   SW t/c providers office and spoke to [place], Charity fundraiser.  The office was not aware of the decline in patients health to the point that he is not responding.  Staff will reach out to patient urgently.  SW also discussed the FL-2 request for Respite.  [place] is not sure of the need for an FL-2 but feels a PCS form is needed.  SW agreed to send the [facility] form today.  [facility]ule will submit to Home Health.  SDOH Interventions    Flowsheet Row Patient Outreach Telephone from 11/18/2023 in [place] POPULATION HEALTH DEPARTMENT Patient Outreach Telephone from 11/02/2023 in [place] POPULATION HEALTH DEPARTMENT Patient Outreach from 05/21/2023 in [place] POPULATION HEALTH DEPARTMENT Care Coordination from 04/10/2023 in [facility] Community Care Coordination Care Coordination from 01/06/2023 in [facility] Community Care Coordination Care Coordination from 12/29/2022 in [facility] Care Coordination  SDOH Interventions        Food  Insecurity Interventions -- Intervention Not Indicated -- Intervention Not Indicated Intervention Not Indicated Intervention Not Indicated  Housing Interventions -- Intervention Not Indicated -- Intervention Not Indicated -- Intervention Not Indicated  Transportation Interventions -- Intervention Not Indicated Intervention Not Indicated Intervention Not Indicated, Programmer, applications Provided, Patient Resources (Friends/Family) Intervention Not Indicated Intervention Not Indicated, Patient Resources (Friends/Family)  Utilities Interventions -- Intervention Not Indicated -- Intervention Not Indicated Intervention Not Indicated Intervention Not Indicated  Alcohol  Usage Interventions -- -- -- Intervention Not Indicated (Score <7) -- Intervention Not Indicated (Score <7)  Depression Interventions/Treatment  -- Medication, Currently on Treatment Medication, Currently on Treatment -- -- --  Financial Strain Interventions Intervention Not Indicated -- Intervention Not Indicated Intervention Not Indicated -- Intervention Not Indicated  Physical Activity Interventions -- -- -- Patient Declined -- Patient Declined  Stress Interventions -- -- -- Intervention Not Indicated -- Intervention Not Indicated  Social Connections Interventions -- -- -- Intervention Not Indicated -- Intervention Not Indicated  Health Literacy Interventions -- Other (Comment)  [dementia wife assists with assessment] -- Intervention Not Indicated Other (Comment)  [dementia] Intervention Not Indicated      Recommendation:   SW will follow up with providers office regarding FL-2 and medical concerns.  Follow Up Plan:   Telephone follow up appointment date/time:  11/25/23 at 11am  SIG Tillman Gardener, BSW [place]  [facility], [facility] Social Worker Direct Dial: [phone]  Fax: [phone] Website: [email or web address]

## 2023-11-18 NOTE — Patient Instructions (Signed)
 Visit Information  Thank you for taking time to visit with me today. Please don't hesitate to contact me if I can be of assistance to you before our next scheduled appointment.  Your next care management appointment is by telephone on 11/25/23 at 11am  Please call the care guide team at [phone] if you need to cancel, schedule, or reschedule an appointment.   Please call 911 if you are experiencing a Mental Health or Behavioral Health Crisis or need someone to talk to.  Tillman Gardener, BSW [place]  [facility], [facility] Social Worker Direct Dial: [phone]  Fax: [phone] Website: [email or web address]

## 2023-11-23 NOTE — Patient Outreach (Signed)
 SW returned call to Donny Counter, Print production planner for Dr. Christyne Hurst. regarding Respite services.  A referral for services with [facility] for Respite through Palliative Care has been made in the past.  Another referral can be made, but request SW speak to family first to confirm they are willing to follow through.  Otherwise the family can consider the LEAF program.  SW explains conversation with Medicare but has not been successful in finding a Home Health agency that bills Medicare for Respite.  SW will follow up with providers office after the 11/25/23 appointment.  Tillman Gardener, BSW   [facility], [facility] Social Worker Direct Dial: [phone]  Fax: [phone] Website: [email or web address]

## 2023-11-25 ENCOUNTER — Other Ambulatory Visit: Payer: Self-pay

## 2023-11-25 NOTE — Patient Outreach (Signed)
 Complex Care Management   Visit Note  11/25/2023  Name:  TYVON EGGENBERGER MRN: [id number] DOB: 02-Jul-1955  Situation: Referral received for Complex Care Management related to SDOH Barriers:  Respite I obtained verbal consent from Patient.  Visit completed with Patient  on the phone  Background:   Past Medical History:  Diagnosis Date   Atrial fibrillation (HCC)    Dementia (HCC)    Heart disease    Hyperchloremia    Seizures (HCC)     Assessment:  Patients wife is informed of conversation with provider regarding [facility]. Patients wife is willing to work with Palliative Care for Respite. SW does educate on the supportive services Palliative can provide. SW will communicate with provider to move forward with the referral for Palliative Care. Patients wife did receive a call from [facility] and will call back today to start the program and information on the $500 toward In Home care. Patients wife does report patient is up most of the night and refused to take medication.    SDOH Interventions    Flowsheet Row Patient Outreach Telephone from 11/18/2023 in [place] POPULATION HEALTH DEPARTMENT Patient Outreach Telephone from 11/02/2023 in [place] POPULATION HEALTH DEPARTMENT Patient Outreach from 05/21/2023 in [place] POPULATION HEALTH DEPARTMENT Care Coordination from 04/10/2023 in [facility] Community Care Coordination Care Coordination from 01/06/2023 in [facility] Care Coordination Care Coordination from 12/29/2022 in [facility] Care Coordination  SDOH Interventions        Food Insecurity Interventions -- Intervention Not Indicated -- Intervention Not Indicated Intervention Not Indicated Intervention Not Indicated  Housing Interventions -- Intervention Not Indicated -- Intervention Not Indicated -- Intervention Not Indicated  Transportation Interventions -- Intervention Not Indicated Intervention Not  Indicated Intervention Not Indicated, Programmer, applications Provided, Patient Resources (Friends/Family) Intervention Not Indicated Intervention Not Indicated, Patient Resources (Friends/Family)  Utilities Interventions -- Intervention Not Indicated -- Intervention Not Indicated Intervention Not Indicated Intervention Not Indicated  Alcohol  Usage Interventions -- -- -- Intervention Not Indicated (Score <7) -- Intervention Not Indicated (Score <7)  Depression Interventions/Treatment  -- Medication, Currently on Treatment Medication, Currently on Treatment -- -- --  Financial Strain Interventions Intervention Not Indicated -- Intervention Not Indicated Intervention Not Indicated -- Intervention Not Indicated  Physical Activity Interventions -- -- -- Patient Declined -- Patient Declined  Stress Interventions -- -- -- Intervention Not Indicated -- Intervention Not Indicated  Social Connections Interventions -- -- -- Intervention Not Indicated -- Intervention Not Indicated  Health Literacy Interventions -- Other (Comment)  [dementia wife assists with assessment] -- Intervention Not Indicated Other (Comment)  [dementia] Intervention Not Indicated      Recommendation:   SW to communicate with provider to move forward with referral to [facility] for Palliative Services and issues with patient not taking medication.  Follow Up Plan:   Telephone follow up appointment date/time:  12/02/23 at 1pm.  Tillman Gardener, BSW [place]  [facility], [facility] Social Worker Direct Dial: [phone]  Fax: [phone] Website: [email or web address]

## 2023-11-25 NOTE — Patient Instructions (Signed)
 Visit Information  Thank you for taking time to visit with me today. Please don't hesitate to contact me if I can be of assistance to you before our next scheduled appointment.  Your next care management appointment is by telephone on 12/02/23 at 1pm   Please call the care guide team at [phone] if you need to cancel, schedule, or reschedule an appointment.   Please call 911 if you are experiencing a Mental Health or Behavioral Health Crisis or need someone to talk to.  Tillman Gardener, BSW [place]  [facility], [facility] Social Worker Direct Dial: [phone]  Fax: [phone] Website: [email or web address]

## 2023-11-28 ENCOUNTER — Inpatient Hospital Stay (HOSPITAL_COMMUNITY)
Admission: EM | Admit: 2023-11-28 | Discharge: 2023-11-30 | DRG: 178 | Disposition: A | Discharge status: Home Health | Attending: Hospitalist | Admitting: Hospitalist

## 2023-11-28 ENCOUNTER — Other Ambulatory Visit: Payer: Self-pay

## 2023-11-28 ENCOUNTER — Encounter (HOSPITAL_COMMUNITY): Payer: Self-pay | Admitting: Emergency Medicine

## 2023-11-28 ENCOUNTER — Emergency Department (HOSPITAL_COMMUNITY)

## 2023-11-28 DIAGNOSIS — E782 Mixed hyperlipidemia: Secondary | ICD-10-CM | POA: Diagnosis present

## 2023-11-28 DIAGNOSIS — R627 Adult failure to thrive: Secondary | ICD-10-CM | POA: Diagnosis present

## 2023-11-28 DIAGNOSIS — J189 Pneumonia, unspecified organism: Secondary | ICD-10-CM | POA: Diagnosis not present

## 2023-11-28 DIAGNOSIS — E860 Dehydration: Secondary | ICD-10-CM | POA: Diagnosis not present

## 2023-11-28 DIAGNOSIS — Z87891 Personal history of nicotine dependence: Secondary | ICD-10-CM | POA: Diagnosis not present

## 2023-11-28 DIAGNOSIS — Z8249 Family history of ischemic heart disease and other diseases of the circulatory system: Secondary | ICD-10-CM | POA: Diagnosis not present

## 2023-11-28 DIAGNOSIS — Z91148 Patient's other noncompliance with medication regimen for other reason: Secondary | ICD-10-CM | POA: Diagnosis not present

## 2023-11-28 DIAGNOSIS — I482 Chronic atrial fibrillation, unspecified: Secondary | ICD-10-CM | POA: Diagnosis present

## 2023-11-28 DIAGNOSIS — J690 Pneumonitis due to inhalation of food and vomit: Principal | ICD-10-CM | POA: Diagnosis present

## 2023-11-28 DIAGNOSIS — R569 Unspecified convulsions: Secondary | ICD-10-CM

## 2023-11-28 DIAGNOSIS — F03918 Unspecified dementia, unspecified severity, with other behavioral disturbance: Secondary | ICD-10-CM | POA: Diagnosis present

## 2023-11-28 DIAGNOSIS — Z66 Do not resuscitate: Secondary | ICD-10-CM | POA: Diagnosis not present

## 2023-11-28 DIAGNOSIS — R059 Cough, unspecified: Secondary | ICD-10-CM | POA: Diagnosis not present

## 2023-11-28 DIAGNOSIS — Z4682 Encounter for fitting and adjustment of non-vascular catheter: Secondary | ICD-10-CM | POA: Diagnosis not present

## 2023-11-28 DIAGNOSIS — R296 Repeated falls: Secondary | ICD-10-CM | POA: Diagnosis present

## 2023-11-28 DIAGNOSIS — G40909 Epilepsy, unspecified, not intractable, without status epilepticus: Secondary | ICD-10-CM | POA: Diagnosis present

## 2023-11-28 DIAGNOSIS — R918 Other nonspecific abnormal finding of lung field: Secondary | ICD-10-CM | POA: Diagnosis not present

## 2023-11-28 DIAGNOSIS — F0280 Dementia in other diseases classified elsewhere without behavioral disturbance: Secondary | ICD-10-CM

## 2023-11-28 DIAGNOSIS — Z7901 Long term (current) use of anticoagulants: Secondary | ICD-10-CM

## 2023-11-28 LAB — COMPREHENSIVE METABOLIC PANEL WITH GFR
ALT: 42 U/L (ref 0–44)
AST: 34 U/L (ref 15–41)
Albumin: 4.4 g/dL (ref 3.5–5.0)
Alkaline Phosphatase: 82 U/L (ref 38–126)
Anion gap: 12 (ref 5–15)
BUN: 18 mg/dL (ref 8–23)
CO2: 28 mmol/L (ref 22–32)
Calcium: 10 mg/dL (ref 8.9–10.3)
Chloride: 101 mmol/L (ref 98–111)
Creatinine, Ser: 0.94 mg/dL (ref 0.61–1.24)
GFR, Estimated: >60 mL/min (ref >60)
Glucose, Bld: 100 mg/dL — ABNORMAL HIGH (ref 70–99)
Potassium: 3.7 mmol/L (ref 3.5–5.1)
Sodium: 141 mmol/L (ref 135–145)
Total Bilirubin: 0.6 mg/dL (ref 0.0–1.2)
Total Protein: 7.2 g/dL (ref 6.5–8.1)

## 2023-11-28 LAB — CBC
HCT: 42.6 % (ref 39.0–52.0)
Hemoglobin: 13.8 g/dL (ref 13.0–17.0)
MCH: 28.7 pg (ref 26.0–34.0)
MCHC: 32.4 g/dL (ref 30.0–36.0)
MCV: 88.6 fL (ref 80.0–100.0)
Platelets: 147 K/uL — ABNORMAL LOW (ref 150–400)
RBC: 4.81 MIL/uL (ref 4.22–5.81)
RDW: 12.8 % (ref 11.5–15.5)
WBC: 4.8 K/uL (ref 4.0–10.5)
nRBC: 0 % (ref 0.0–0.2)

## 2023-11-28 LAB — MAGNESIUM: Magnesium: 2.3 mg/dL (ref 1.7–2.4)

## 2023-11-28 MED ORDER — SODIUM CHLORIDE 0.9 % IV SOLN
3.0000 g | Freq: Once | INTRAVENOUS | Status: AC
  Administered 2023-11-28: 3 g via INTRAVENOUS
  Filled 2023-11-28: qty 8

## 2023-11-28 MED ORDER — SODIUM CHLORIDE 0.9 % IV SOLN: INTRAVENOUS | Status: AC

## 2023-11-28 MED ORDER — SODIUM CHLORIDE 0.9 % IV SOLN: INTRAVENOUS | Status: DC

## 2023-11-28 MED ORDER — LEVETIRACETAM (KEPPRA) 500 MG/5 ML ADULT IV PUSH
1000.0000 mg | Freq: Once | INTRAVENOUS | Status: AC
  Administered 2023-11-28: 1000 mg via INTRAVENOUS
  Filled 2023-11-28: qty 10

## 2023-11-28 NOTE — H&P (Incomplete)
 History and Physical    Patient: Jonathan Poole DOB: 1955-11-14 DOA: 11/28/2023 DOS: the patient was seen and examined on 11/28/2023 PCP: [name] [name], MD  Patient coming from: Home  Chief Complaint:  Chief Complaint  Patient presents with  . Failure To Thrive   HPI: Jonathan Poole is a 68 y.o. male with medical history significant of  hyperlipidemia, atrial fibrillation, dementia, seizures who presents to the emergency department from home via EMS due to not eating or drinking or taking his medications in the last 4 days.  At bedside, patient was able to tell me his name and was aware of being in the hospital.  He states that he came to the hospital to see his wife who was sick (patient has a history of dementia).  He did not know what year it is and did not know the name of the president of the [place] .  History was obtained from EDP and ED medical records.  Per report, wife reported that patient was choking on pills, so he stopped taking his medication, he has been talking less and has not been eating or drinking as mentioned above, so wife activated EMS and patient was sent to the ED for further evaluation and management.  At baseline, patient usually gets around the house without the use of any assistive device.  ED Course:  In the emergency department, he was hemodynamically stable.  Workup in the ED showed normal CBC except platelets of 147.  BMP was normal except for blood glucose of 100.  Magnesium 2.3. Chest x-ray was suggestive of developing patchy airspace opacity at the right lower lung zone. Patient was treated with Unasyn, Keppra  was given.  IV hydration was provided [facility] was asked to admit patient. Review of Systems: Review of systems as noted in the HPI. All other systems reviewed and are negative.   Past Medical History:  Diagnosis Date  . Atrial fibrillation (HCC)   . Dementia (HCC)   . Heart disease   . Hyperchloremia   . Seizures (HCC)    Past  Surgical History:  Procedure Laterality Date  . PACEMAKER IMPLANT    . PPM GENERATOR CHANGEOUT N/A 01/02/2020   Procedure: PPM GENERATOR CHANGEOUT;  Surgeon: Waddell Danelle ORN, MD;  Location: [facility] INVASIVE CV LAB;  Service: Cardiovascular;  Laterality: N/A;    Social History:  reports that he quit smoking about 24 years ago. His smoking use included cigarettes. He started smoking about 39 years ago. He has a 15 pack-year smoking history. He has been exposed to tobacco smoke. He has never used smokeless tobacco. He reports that he does not currently use drugs after having used the following drugs: Marijuana. He reports that he does not drink alcohol .   No Known Allergies  Family History  Problem Relation Age of Onset  . Heart disease Father   . Prostate cancer Paternal Grandfather     ***  Prior to Admission medications   Medication Sig Start Date End Date Taking? Authorizing Provider  acetaminophen  (TYLENOL ) 500 MG tablet Take 500-1,000 mg by mouth every 6 (six) hours as needed (when not taking Ibuprofen).    [provider]  apixaban  (ELIQUIS ) 5 MG TABS tablet Take 1 tablet (5 mg total) by mouth 2 (two) times daily. NEEDS CARDIOLOGY APPT, CALL OFFICE [phone].  THANK YOU 08/28/23   Waddell Danelle ORN, MD  artificial tears ophthalmic solution Place 1 drop into the right eye as needed for dry eyes. 08/03/23  Bryn Bernardino NOVAK, MD  Buprenorphine  HCl-Naloxone  HCl 4-1 MG FILM Place 1 Film under the tongue daily. 08/15/23   [provider]  cyanocobalamin  (VITAMIN B12) 1000 MCG/ML injection Inject 1,000 mcg into the muscle every 30 (thirty) days. 03/28/23   [provider]  donepezil  (ARICEPT ) 10 MG tablet TAKE 1 TABLET AT BEDTIME 06/16/22   Tobie Suzzane POUR, MD  feeding supplement (ENSURE PLUS HIGH PROTEIN) LIQD Take 237 mLs by mouth 3 (three) times daily between meals. 08/03/23   Bryn Bernardino NOVAK, MD  gabapentin  (NEURONTIN ) 300 MG capsule Take 300 mg by mouth 3 (three) times daily.  08/15/23   [provider]  Ibuprofen 200 MG CAPS Take 800 mg by mouth every 6 (six) hours as needed (for pain- when not taking Tylenol ).    [provider]  levETIRAcetam  (KEPPRA ) 1000 MG tablet Take 1 tablet (1,000 mg total) by mouth 2 (two) times daily. Patient not taking: Reported on 11/02/2023 06/05/23   Bryn Bernardino NOVAK, MD  memantine  (NAMENDA ) 10 MG tablet Take 10 mg by mouth in the morning and at bedtime. 05/09/23   [provider]  Multiple Vitamin (MULTIVITAMIN WITH MINERALS) TABS tablet Take 1 tablet by mouth daily. 08/04/23   Bryn Bernardino NOVAK, MD  naloxone  (NARCAN ) nasal spray 4 mg/0.1 mL Place 1 spray into the nose once as needed (OD). 01/28/22   [provider]  QUEtiapine (SEROQUEL) 50 MG tablet Take 50 mg by mouth 3 (three) times daily as needed (mood).    [provider]  REXULTI  3 MG TABS Take 3 mg by mouth in the morning.    [provider]  rosuvastatin  (CRESTOR ) 20 MG tablet TAKE 1 TABLET ONE TIME DAILY Patient taking differently: Take 20 mg by mouth in the morning. 02/26/22   Tobie Suzzane POUR, MD  thiamine  250 MG TABS Take 500 mg by mouth daily. 08/04/23   Bryn Bernardino NOVAK, MD  tiZANidine  (ZANAFLEX ) 4 MG tablet Take 1 tablet (4 mg total) by mouth 3 (three) times daily. Patient taking differently: Take 4 mg by mouth every 6 (six) hours as needed for muscle spasms. 07/31/21   Bacchus, Meade PEDLAR, FNP  tobramycin  (TOBREX ) 0.3 % ophthalmic solution Place 1 drop into the right eye every 6 (six) hours. 08/03/23   Bryn Bernardino NOVAK, MD    Physical Exam: BP (!) 128/90   Pulse 84   Temp 98 F (36.7 C) (Oral)   Resp 15   Ht 6' (1.829 m)   Wt 71.2 kg   SpO2 99%   BMI 21.29 kg/m   General: 68 y.o. year-old male well developed well nourished in no acute distress.  Alert and oriented x 2 (place and person). HEENT: NCAT, EOMI Neck: Supple, trachea medial Cardiovascular: Regular rate and rhythm with no rubs or gallops.  No thyromegaly or JVD noted.   No lower extremity edema. 2/4 pulses in all 4 extremities. Respiratory: Clear to auscultation with no wheezes or rales. Good inspiratory effort. Abdomen: Soft, nontender nondistended with normal bowel sounds x4 quadrants. Muskuloskeletal: No cyanosis, clubbing or edema noted bilaterally Neuro: CN II-XII intact, strength 5/5 x 4, sensation, reflexes intact Skin: No ulcerative lesions noted or rashes Psychiatry:  Mood is appropriate for condition and setting          Labs on Admission:  Basic Metabolic Panel: Recent Labs  Lab 11/28/23 2112  NA 141  K 3.7  CL 101  CO2 28  GLUCOSE 100*  BUN 18  CREATININE  0.94  CALCIUM  10.0  MG 2.3   Liver Function Tests: Recent Labs  Lab 11/28/23 2112  AST 34  ALT 42  ALKPHOS 82  BILITOT 0.6  PROT 7.2  ALBUMIN 4.4   No results for input(s): LIPASE, AMYLASE in the last 168 hours. No results for input(s): AMMONIA in the last 168 hours. CBC: Recent Labs  Lab 11/28/23 2112  WBC 4.8  HGB 13.8  HCT 42.6  MCV 88.6  PLT 147*   Cardiac Enzymes: No results for input(s): CKTOTAL, CKMB, CKMBINDEX, TROPONINI in the last 168 hours.  BNP (last 3 results) No results for input(s): BNP in the last 8760 hours.  ProBNP (last 3 results) No results for input(s): PROBNP in the last 8760 hours.  CBG: No results for input(s): GLUCAP in the last 168 hours.  Radiological Exams on Admission: DG Chest 1 View Result Date: 11/28/2023 EXAM: 1 VIEW XRAY OF THE CHEST 11/28/2023 08:43:00 PM COMPARISON: Chest x-ray 07/28/2023. CLINICAL HISTORY: 89968 Cough 10031. Pt bib EMS from home after wife reports decline in general health. Pt with advanced alzheimer's and per wife, pt stopped eating and drinking and taking his medications 4 days ago. Wife also reported to EMS that pt has had several falls over the last 2 ; weeks as well. EDP at bedside. FINDINGS: LINES, TUBES AND DEVICES: Right chest wall 2 lead dialysis catheter. LUNGS AND PLEURA:  Chronic coarsening interstitial markings. No overt pulmonary edema. Question developing patchy airspace opacity at the right lower lung zone. No pleural effusion. No pneumothorax. HEART AND MEDIASTINUM: Atherosclerotic plaque. No acute abnormality of the cardiac and mediastinal silhouettes. BONES AND SOFT TISSUES: No acute osseous abnormality. IMPRESSION: 1. Questionable developing patchy airspace opacity at the right lower lung zone. Recommend repeat chest x-ray PA and lateral view for further evaluation. Electronically signed by: Morgane Naveau MD 11/28/2023 08:55 PM EDT RP Workstation: [id number]    EKG: I independently viewed the EKG done and my findings are as followed: EKG was not done in the ED  Assessment/Plan Present on Admission: . Aspiration pneumonia (HCC)  Principal Problem:   Aspiration pneumonia (HCC)   Presumed aspiration pneumonia Patient was started on *** and  ***, we shall continue same at this time with plan to de-escalate/discontinue based on blood culture, sputum culture, urine Legionella, strep pneumo and procalcitonin Continue Tylenol  as needed Continue Mucinex, incentive spirometry, flutter valve      DVT prophylaxis: ***   Code Status: ***   Family Communication: ***   Disposition Plan: ***   Consults called: ***   Admission status: ***     Posey Maier MD [facility] Pager [phone]  If 7PM-7AM, please contact night-coverage [email or web address] Password [facility]  11/28/2023, 11:32 PM       Review of Systems: {ROS_Text:26778} Past Medical History:  Diagnosis Date  . Atrial fibrillation (HCC)   . Dementia (HCC)   . Heart disease   . Hyperchloremia   . Seizures (HCC)    Past Surgical History:  Procedure Laterality Date  . PACEMAKER IMPLANT    . PPM GENERATOR CHANGEOUT N/A 01/02/2020   Procedure: PPM GENERATOR CHANGEOUT;  Surgeon: Waddell Danelle ORN, MD;  Location: [facility] INVASIVE CV LAB;  Service: Cardiovascular;  Laterality: N/A;    Social History:  reports that he quit smoking about 24 years ago. His smoking use included cigarettes. He started smoking about 39 years ago. He has a 15 pack-year smoking history. He has been exposed to tobacco smoke. He has never used smokeless tobacco.  He reports that he does not currently use drugs after having used the following drugs: Marijuana. He reports that he does not drink alcohol .  No Known Allergies  Family History  Problem Relation Age of Onset  . Heart disease Father   . Prostate cancer Paternal Grandfather     Prior to Admission medications   Medication Sig Start Date End Date Taking? Authorizing Provider  acetaminophen  (TYLENOL ) 500 MG tablet Take 500-1,000 mg by mouth every 6 (six) hours as needed (when not taking Ibuprofen).    [provider]  apixaban  (ELIQUIS ) 5 MG TABS tablet Take 1 tablet (5 mg total) by mouth 2 (two) times daily. NEEDS CARDIOLOGY APPT, CALL OFFICE [phone].  THANK YOU 08/28/23   Waddell Danelle ORN, MD  artificial tears ophthalmic solution Place 1 drop into the right eye as needed for dry eyes. 08/03/23   Bryn Bernardino NOVAK, MD  Buprenorphine  HCl-Naloxone  HCl 4-1 MG FILM Place 1 Film under the tongue daily. 08/15/23   [provider]  cyanocobalamin  (VITAMIN B12) 1000 MCG/ML injection Inject 1,000 mcg into the muscle every 30 (thirty) days. 03/28/23   [provider]  donepezil  (ARICEPT ) 10 MG tablet TAKE 1 TABLET AT BEDTIME 06/16/22   Tobie Suzzane POUR, MD  feeding supplement (ENSURE PLUS HIGH PROTEIN) LIQD Take 237 mLs by mouth 3 (three) times daily between meals. 08/03/23   Bryn Bernardino NOVAK, MD  gabapentin  (NEURONTIN ) 300 MG capsule Take 300 mg by mouth 3 (three) times daily. 08/15/23   [provider]  Ibuprofen 200 MG CAPS Take 800 mg by mouth every 6 (six) hours as needed (for pain- when not taking Tylenol ).    [provider]  levETIRAcetam  (KEPPRA ) 1000 MG tablet Take 1 tablet (1,000 mg total) by mouth 2 (two) times  daily. Patient not taking: Reported on 11/02/2023 06/05/23   Bryn Bernardino NOVAK, MD  memantine  (NAMENDA ) 10 MG tablet Take 10 mg by mouth in the morning and at bedtime. 05/09/23   [provider]  Multiple Vitamin (MULTIVITAMIN WITH MINERALS) TABS tablet Take 1 tablet by mouth daily. 08/04/23   Bryn Bernardino NOVAK, MD  naloxone  (NARCAN ) nasal spray 4 mg/0.1 mL Place 1 spray into the nose once as needed (OD). 01/28/22   [provider]  QUEtiapine (SEROQUEL) 50 MG tablet Take 50 mg by mouth 3 (three) times daily as needed (mood).    [provider]  REXULTI  3 MG TABS Take 3 mg by mouth in the morning.    [provider]  rosuvastatin  (CRESTOR ) 20 MG tablet TAKE 1 TABLET ONE TIME DAILY Patient taking differently: Take 20 mg by mouth in the morning. 02/26/22   Tobie Suzzane POUR, MD  thiamine  250 MG TABS Take 500 mg by mouth daily. 08/04/23   Bryn Bernardino NOVAK, MD  tiZANidine  (ZANAFLEX ) 4 MG tablet Take 1 tablet (4 mg total) by mouth 3 (three) times daily. Patient taking differently: Take 4 mg by mouth every 6 (six) hours as needed for muscle spasms. 07/31/21   Bacchus, Meade PEDLAR, FNP  tobramycin  (TOBREX ) 0.3 % ophthalmic solution Place 1 drop into the right eye every 6 (six) hours. 08/03/23   Bryn Bernardino NOVAK, MD    Physical Exam: Vitals:   11/28/23 2024 11/28/23 2026 11/28/23 2230 11/28/23 2245  BP: 111/78  124/87 (!) 128/90  Pulse: 78  72 84  Resp: 15     Temp: 98 F (36.7 C)     TempSrc: Oral     SpO2:  98%  99% 99%  Weight:  71.2 kg    Height:  6' (1.829 m)     *** Data Reviewed: {Tip this will not be part of the note when signed- Document your independent interpretation of telemetry tracing, EKG, lab, Radiology test or any other diagnostic tests. Add any new diagnostic test ordered today. (Optional):26781} {Results:26384}  Assessment and Plan: No notes have been filed under this hospital service. Service: Hospitalist     Advance Care Planning:   Code Status: Prior  ***  Consults: ***  Family Communication: ***  Severity of Illness: {Observation/Inpatient:21159}  Author: Posey Maier, DO 11/28/2023 11:01 PM  For on call review [email or web address].

## 2023-11-28 NOTE — ED Notes (Signed)
 Pt's wife Quin Mathenia can be reached at [phone] or [phone].

## 2023-11-28 NOTE — ED Triage Notes (Addendum)
 Pt bib EMS from home after wife reports decline in general health. Pt with advanced alzheimer's and per wife, pt stopped eating and drinking and taking his medications 4 days ago. Wife also reported to EMS that pt has had several falls over the last 2 weeks as well. EDP at bedside.

## 2023-11-28 NOTE — H&P (Incomplete)
 History and Physical    Patient: Jonathan Poole FMW:[id number] DOB: May 17, 1955 DOA: 11/28/2023 DOS: the patient was seen and examined on 11/29/2023 PCP: [name] [name], MD  Patient coming from: Home  Chief Complaint:  Chief Complaint  Patient presents with   Failure To Thrive   HPI: Jonathan Poole is a 68 y.o. male with medical history significant of  hyperlipidemia, atrial fibrillation, dementia, seizures who presents to the emergency department from home via EMS due to not eating or drinking or taking his medications in the last 4 days.  At bedside, patient was able to tell me his name and was aware of being in the hospital.  He states that he came to the hospital to see his wife who was sick (patient has a history of dementia).  He did not know what year it is and did not know the name of the president of the [place] .  History was obtained from EDP and ED medical records.  Per report, wife reported that patient was choking on pills, so he stopped taking his medication, he has been talking less and has not been eating or drinking as mentioned above.  He was also reported to have about frequent falls within the last 1 to 2 weeks. Wife activated EMS and patient was sent to the ED for further evaluation and management.  At baseline, patient usually gets around the house without the use of any assistive device.  ED Course:  In the emergency department, he was hemodynamically stable.  Workup in the ED showed normal CBC except platelets of 147.  BMP was normal except for blood glucose of 100.  Magnesium 2.3. Chest x-ray was suggestive of developing patchy airspace opacity at the right lower lung zone. Patient was treated with Unasyn, Keppra  was given.  IV hydration was provided [facility] was asked to admit patient. Review of Systems: Review of systems as noted in the HPI. All other systems reviewed and are negative.   Past Medical History:  Diagnosis Date   Atrial fibrillation (HCC)    Dementia  (HCC)    Heart disease    Hyperchloremia    Seizures (HCC)    Past Surgical History:  Procedure Laterality Date   PACEMAKER IMPLANT     PPM GENERATOR CHANGEOUT N/A 01/02/2020   Procedure: PPM GENERATOR CHANGEOUT;  Surgeon: Waddell Danelle ORN, MD;  Location: [facility];  Service: Cardiovascular;  Laterality: N/A;    Social History:  reports that he quit smoking about 24 years ago. His smoking use included cigarettes. He started smoking about 39 years ago. He has a 15 pack-year smoking history. He has been exposed to tobacco smoke. He has never used smokeless tobacco. He reports that he does not currently use drugs after having used the following drugs: Marijuana. He reports that he does not drink alcohol .   No Known Allergies  Family History  Problem Relation Age of Onset   Heart disease Father    Prostate cancer Paternal Grandfather      Prior to Admission medications   Medication Sig Start Date End Date Taking? Authorizing Provider  acetaminophen  (TYLENOL ) 500 MG tablet Take 500-1,000 mg by mouth every 6 (six) hours as needed (when not taking Ibuprofen).    [provider]  apixaban  (ELIQUIS ) 5 MG TABS tablet Take 1 tablet (5 mg total) by mouth 2 (two) times daily. NEEDS CARDIOLOGY APPT, CALL OFFICE [phone].  THANK YOU 08/28/23   Waddell Danelle ORN, MD  artificial tears ophthalmic  solution Place 1 drop into the right eye as needed for dry eyes. 08/03/23   Bryn Bernardino NOVAK, MD  Buprenorphine  HCl-Naloxone  HCl 4-1 MG FILM Place 1 Film under the tongue daily. 08/15/23   [provider]  cyanocobalamin  (VITAMIN B12) 1000 MCG/ML injection Inject 1,000 mcg into the muscle every 30 (thirty) days. 03/28/23   [provider]  donepezil  (ARICEPT ) 10 MG tablet TAKE 1 TABLET AT BEDTIME 06/16/22   Tobie Suzzane POUR, MD  feeding supplement (ENSURE PLUS HIGH PROTEIN) LIQD Take 237 mLs by mouth 3 (three) times daily between meals. 08/03/23   Bryn Bernardino NOVAK, MD  gabapentin   (NEURONTIN ) 300 MG capsule Take 300 mg by mouth 3 (three) times daily. 08/15/23   [provider]  Ibuprofen 200 MG CAPS Take 800 mg by mouth every 6 (six) hours as needed (for pain- when not taking Tylenol ).    [provider]  levETIRAcetam  (KEPPRA ) 1000 MG tablet Take 1 tablet (1,000 mg total) by mouth 2 (two) times daily. Patient not taking: Reported on 11/02/2023 06/05/23   Bryn Bernardino NOVAK, MD  memantine  (NAMENDA ) 10 MG tablet Take 10 mg by mouth in the morning and at bedtime. 05/09/23   [provider]  Multiple Vitamin (MULTIVITAMIN WITH MINERALS) TABS tablet Take 1 tablet by mouth daily. 08/04/23   Bryn Bernardino NOVAK, MD  naloxone  (NARCAN ) nasal spray 4 mg/0.1 mL Place 1 spray into the nose once as needed (OD). 01/28/22   [provider]  QUEtiapine (SEROQUEL) 50 MG tablet Take 50 mg by mouth 3 (three) times daily as needed (mood).    [provider]  REXULTI  3 MG TABS Take 3 mg by mouth in the morning.    [provider]  rosuvastatin  (CRESTOR ) 20 MG tablet TAKE 1 TABLET ONE TIME DAILY Patient taking differently: Take 20 mg by mouth in the morning. 02/26/22   Tobie Suzzane POUR, MD  thiamine  250 MG TABS Take 500 mg by mouth daily. 08/04/23   Bryn Bernardino NOVAK, MD  tiZANidine  (ZANAFLEX ) 4 MG tablet Take 1 tablet (4 mg total) by mouth 3 (three) times daily. Patient taking differently: Take 4 mg by mouth every 6 (six) hours as needed for muscle spasms. 07/31/21   Bacchus, Meade PEDLAR, FNP  tobramycin  (TOBREX ) 0.3 % ophthalmic solution Place 1 drop into the right eye every 6 (six) hours. 08/03/23   Bryn Bernardino NOVAK, MD    Physical Exam: BP (!) 141/87   Pulse 72   Temp 98 F (36.7 C) (Oral)   Resp 15   Ht 6' (1.829 m)   Wt 71.2 kg   SpO2 98%   BMI 21.29 kg/m   General: 68 y.o. year-old male well developed well nourished in no acute distress.  Alert and oriented x 2 (place and person). HEENT: NCAT, EOMI Neck: Supple, trachea medial Cardiovascular: Regular  rate and rhythm with no rubs or gallops.  No thyromegaly or JVD noted.  No lower extremity edema. 2/4 pulses in all 4 extremities. Respiratory: Clear to auscultation with no wheezes or rales. Good inspiratory effort. Abdomen: Soft, nontender nondistended with normal bowel sounds x4 quadrants. Muskuloskeletal: No cyanosis, clubbing or edema noted bilaterally Neuro: CN II-XII intact, strength 5/5 x 4, sensation, reflexes intact Skin: No ulcerative lesions noted or rashes Psychiatry:  Mood is appropriate for condition and setting          Labs on Admission:  Basic Metabolic Panel: Recent Labs  Lab 11/28/23 2112  NA 141  K 3.7  CL 101  CO2 28  GLUCOSE 100*  BUN 18  CREATININE 0.94  CALCIUM  10.0  MG 2.3   Liver Function Tests: Recent Labs  Lab 11/28/23 2112  AST 34  ALT 42  ALKPHOS 82  BILITOT 0.6  PROT 7.2  ALBUMIN 4.4   No results for input(s): LIPASE, AMYLASE in the last 168 hours. No results for input(s): AMMONIA in the last 168 hours. CBC: Recent Labs  Lab 11/28/23 2112  WBC 4.8  HGB 13.8  HCT 42.6  MCV 88.6  PLT 147*   Cardiac Enzymes: No results for input(s): CKTOTAL, CKMB, CKMBINDEX, TROPONINI in the last 168 hours.  BNP (last 3 results) No results for input(s): BNP in the last 8760 hours.  ProBNP (last 3 results) No results for input(s): PROBNP in the last 8760 hours.  CBG: No results for input(s): GLUCAP in the last 168 hours.  Radiological Exams on Admission: DG Chest 1 View Result Date: 11/28/2023 EXAM: 1 VIEW XRAY OF THE CHEST 11/28/2023 08:43:00 PM COMPARISON: Chest x-ray 07/28/2023. CLINICAL HISTORY: 89968 Cough 10031. Pt bib EMS from home after wife reports decline in general health. Pt with advanced alzheimer's and per wife, pt stopped eating and drinking and taking his medications 4 days ago. Wife also reported to EMS that pt has had several falls over the last 2 ; weeks as well. EDP at bedside. FINDINGS: LINES, TUBES AND  DEVICES: Right chest wall 2 lead dialysis catheter. LUNGS AND PLEURA: Chronic coarsening interstitial markings. No overt pulmonary edema. Question developing patchy airspace opacity at the right lower lung zone. No pleural effusion. No pneumothorax. HEART AND MEDIASTINUM: Atherosclerotic plaque. No acute abnormality of the cardiac and mediastinal silhouettes. BONES AND SOFT TISSUES: No acute osseous abnormality. IMPRESSION: 1. Questionable developing patchy airspace opacity at the right lower lung zone. Recommend repeat chest x-ray PA and lateral view for further evaluation. Electronically signed by: Morgane Naveau MD 11/28/2023 08:55 PM EDT RP Workstation: [id number]    EKG: I independently viewed the EKG done and my findings are as followed: EKG was not done in the ED  Assessment/Plan Present on Admission:  Aspiration pneumonia (HCC)  Chronic atrial fibrillation (HCC)  Dementia with behavioral disturbance (HCC)  Mixed hyperlipidemia  Principal Problem:   Aspiration pneumonia (HCC) Active Problems:   Dementia with behavioral disturbance (HCC)   Chronic atrial fibrillation (HCC)   Mixed hyperlipidemia   Recurrent falls   Failure to thrive in adult   Seizure [facility])   Presumed aspiration pneumonia Patient was started on Unasyn and  we shall continue Unasyn and azithromycin at this time with plan to de-escalate/discontinue based on blood culture, sputum culture, urine Legionella, strep pneumo and procalcitonin Continue Tylenol  as needed Continue Mucinex, incentive spirometry, flutter valve  Bedside swallow eval prior to oral intake Continue SLP eval and treat prior to oral intake  Failure to thrive in adult Protein supplement will be provided Dietitian will be consulted and we shall await further recommendations  Recurrent falls He was reported to have frequent falls within the last 1 to 2 weeks Continue fall precaution Consult PT/OT eval and treat  Dementia Continue Aricept  and  Namenda , multivitamins and nutritional supplements.  Continue delirium precaution  Chronic atrial fibrillation Continue Eliquis  Patient was not on any rate control medication per med rec   Seizure disorder Continue Keppra   Mixed hyperlipidemia Continue rosuvastatin    DVT prophylaxis: Eliquis   Code Status: DNR  Family Communication: None at bedside  Consults: None  Severity  of Illness: The appropriate patient status for this patient is INPATIENT. Inpatient status is judged to be reasonable and necessary in order to provide the required intensity of service to ensure the patient's safety. The patient's presenting symptoms, physical exam findings, and initial radiographic and laboratory data in the context of their chronic comorbidities is felt to place them at high risk for further clinical deterioration. Furthermore, it is not anticipated that the patient will be medically stable for discharge from the hospital within 2 midnights of admission.   * I certify that at the point of admission it is my clinical judgment that the patient will require inpatient hospital care spanning beyond 2 midnights from the point of admission due to high intensity of service, high risk for further deterioration and high frequency of surveillance required.*  Author: Sanjuana Mruk, DO 11/29/2023 12:27 AM  For on call review [email or web address].

## 2023-11-28 NOTE — ED Provider Notes (Addendum)
 [place] EMERGENCY DEPARTMENT AT [facility] Provider Note   CSN: [id number] Arrival date & time: 11/28/23  2017     Patient presents with: Failure To Thrive   Jonathan Poole is a 68 y.o. male.   HPI   This patient is a 68 year old male, he has a history of fairly advanced dementia, he lives with his wife, he is on Keppra  for seizures, he is on medications such as Seroquel result he memantine  and donepezil ,  I have reviewed the medical record, it appears that the significant other has been tried to communicate with the office regarding respite services and palliative care, they did not want to follow through with palliative care in the past.  The last visit with the primary care physician was in August at Dr. [name] office, he has chronic A-fib and chronic pain  The spouse reports that for the last 4 days he has had nothing to eat or drink and has not been taking his medications, he is doing poorly and has had frequent falls over the last week or 2  Prior to Admission medications   Medication Sig Start Date End Date Taking? Authorizing Provider  acetaminophen  (TYLENOL ) 500 MG tablet Take 500-1,000 mg by mouth every 6 (six) hours as needed (when not taking Ibuprofen).    [provider]  apixaban  (ELIQUIS ) 5 MG TABS tablet Take 1 tablet (5 mg total) by mouth 2 (two) times daily. NEEDS CARDIOLOGY APPT, CALL OFFICE [phone].  THANK YOU 08/28/23   Waddell Danelle ORN, MD  artificial tears ophthalmic solution Place 1 drop into the right eye as needed for dry eyes. 08/03/23   Bryn Bernardino NOVAK, MD  Buprenorphine  HCl-Naloxone  HCl 4-1 MG FILM Place 1 Film under the tongue daily. 08/15/23   [provider]  cyanocobalamin  (VITAMIN B12) 1000 MCG/ML injection Inject 1,000 mcg into the muscle every 30 (thirty) days. 03/28/23   [provider]  donepezil  (ARICEPT ) 10 MG tablet TAKE 1 TABLET AT BEDTIME 06/16/22   Tobie Suzzane POUR, MD  feeding supplement (ENSURE PLUS  HIGH PROTEIN) LIQD Take 237 mLs by mouth 3 (three) times daily between meals. 08/03/23   Bryn Bernardino NOVAK, MD  gabapentin  (NEURONTIN ) 300 MG capsule Take 300 mg by mouth 3 (three) times daily. 08/15/23   [provider]  Ibuprofen 200 MG CAPS Take 800 mg by mouth every 6 (six) hours as needed (for pain- when not taking Tylenol ).    [provider]  levETIRAcetam  (KEPPRA ) 1000 MG tablet Take 1 tablet (1,000 mg total) by mouth 2 (two) times daily. Patient not taking: Reported on 11/02/2023 06/05/23   Bryn Bernardino NOVAK, MD  memantine  (NAMENDA ) 10 MG tablet Take 10 mg by mouth in the morning and at bedtime. 05/09/23   [provider]  Multiple Vitamin (MULTIVITAMIN WITH MINERALS) TABS tablet Take 1 tablet by mouth daily. 08/04/23   Bryn Bernardino NOVAK, MD  naloxone  (NARCAN ) nasal spray 4 mg/0.1 mL Place 1 spray into the nose once as needed (OD). 01/28/22   [provider]  QUEtiapine (SEROQUEL) 50 MG tablet Take 50 mg by mouth 3 (three) times daily as needed (mood).    [provider]  REXULTI  3 MG TABS Take 3 mg by mouth in the morning.    [provider]  rosuvastatin  (CRESTOR ) 20 MG tablet TAKE 1 TABLET ONE TIME DAILY Patient taking differently: Take 20 mg by mouth in the morning. 02/26/22   Tobie Suzzane POUR, MD  thiamine  250  MG TABS Take 500 mg by mouth daily. 08/04/23   Bryn Bernardino NOVAK, MD  tiZANidine  (ZANAFLEX ) 4 MG tablet Take 1 tablet (4 mg total) by mouth 3 (three) times daily. Patient taking differently: Take 4 mg by mouth every 6 (six) hours as needed for muscle spasms. 07/31/21   Bacchus, Meade PEDLAR, FNP  tobramycin  (TOBREX ) 0.3 % ophthalmic solution Place 1 drop into the right eye every 6 (six) hours. 08/03/23   Bryn Bernardino NOVAK, MD    Allergies: Patient has no known allergies.    Review of Systems  All other systems reviewed and are negative.   Updated Vital Signs BP 111/78 (BP Location: Left Arm)   Pulse 78   Temp 98 F (36.7 C) (Oral)   Resp 15   Ht  1.829 m (6')   Wt 71.2 kg   SpO2 98%   BMI 21.29 kg/m   Physical Exam Vitals and nursing note reviewed.  Constitutional:      General: He is not in acute distress.    Appearance: He is well-developed.  HENT:     Head: Normocephalic and atraumatic.     Mouth/Throat:     Pharynx: No oropharyngeal exudate.  Eyes:     General: No scleral icterus.       Right eye: No discharge.        Left eye: No discharge.     Conjunctiva/sclera: Conjunctivae normal.     Pupils: Pupils are equal, round, and reactive to light.  Neck:     Thyroid : No thyromegaly.     Vascular: No JVD.  Cardiovascular:     Rate and Rhythm: Normal rate and regular rhythm.     Heart sounds: Normal heart sounds. No murmur heard.    No friction rub. No gallop.  Pulmonary:     Effort: Pulmonary effort is normal. No respiratory distress.     Breath sounds: Normal breath sounds. No wheezing or rales.  Abdominal:     General: Bowel sounds are normal. There is no distension.     Palpations: Abdomen is soft. There is no mass.     Tenderness: There is no abdominal tenderness.  Musculoskeletal:        General: No tenderness. Normal range of motion.     Cervical back: Normal range of motion and neck supple.     Right lower leg: No edema.     Left lower leg: No edema.  Lymphadenopathy:     Cervical: No cervical adenopathy.  Skin:    General: Skin is warm and dry.     Findings: No erythema or rash.  Neurological:     General: No focal deficit present.     Mental Status: He is alert.     Coordination: Coordination normal.  Psychiatric:        Behavior: Behavior normal.     (all labs ordered are listed, but only abnormal results are displayed) Labs Reviewed  CBC - Abnormal; Notable for the following components:      Result Value   Platelets 147 (*)    All other components within normal limits  COMPREHENSIVE METABOLIC PANEL WITH GFR - Abnormal; Notable for the following components:   Glucose, Bld 100 (*)    All  other components within normal limits  MAGNESIUM  URINALYSIS, ROUTINE W REFLEX MICROSCOPIC    EKG: None  Radiology: DG Chest 1 View Result Date: 11/28/2023 EXAM: 1 VIEW XRAY OF THE CHEST 11/28/2023 08:43:00 PM COMPARISON: Chest x-ray 07/28/2023. CLINICAL  HISTORY: 89968 Cough 10031. Pt bib EMS from home after wife reports decline in general health. Pt with advanced alzheimer's and per wife, pt stopped eating and drinking and taking his medications 4 days ago. Wife also reported to EMS that pt has had several falls over the last 2 ; weeks as well. EDP at bedside. FINDINGS: LINES, TUBES AND DEVICES: Right chest wall 2 lead dialysis catheter. LUNGS AND PLEURA: Chronic coarsening interstitial markings. No overt pulmonary edema. Question developing patchy airspace opacity at the right lower lung zone. No pleural effusion. No pneumothorax. HEART AND MEDIASTINUM: Atherosclerotic plaque. No acute abnormality of the cardiac and mediastinal silhouettes. BONES AND SOFT TISSUES: No acute osseous abnormality. IMPRESSION: 1. Questionable developing patchy airspace opacity at the right lower lung zone. Recommend repeat chest x-ray PA and lateral view for further evaluation. Electronically signed by: Morgane Naveau MD 11/28/2023 08:55 PM EDT RP Workstation: [id number]     Procedures   Medications Ordered in the ED  0.9 %  sodium chloride  infusion ( Intravenous New Bag/Given 11/28/23 2126)  Ampicillin-Sulbactam (UNASYN) 3 g in sodium chloride  0.9 % 100 mL IVPB (has no administration in time range)  levETIRAcetam  (KEPPRA ) undiluted injection 1,000 mg (1,000 mg Intravenous Given 11/28/23 2127)                                    Medical Decision Making Amount and/or Complexity of Data Reviewed Labs: ordered. Radiology: ordered.  Risk Prescription drug management. Decision regarding hospitalization.   This patient has unremarkable vital signs, significant failure to thrive, over 4 days without any oral  intake there is concern that he may decompensate have renal failure, will check for blood work including electrolytes, blood counts, chest x-ray, urinalysis for other sources that may have caused him to acutely worsen.  Spoke with wife at 10:30 PM - states his feet were swelling the last 5 days - has not been eating or drinking much at all - has not had meds in a week - then couldn't swallow them and then would spit them out - then stopped taking them.  Has had a slight cough when he lays down.  Subtle cough.  He has been sitting in a chair - won't go to his bed to lay down. He has advanced dementia and seems to be getting worse all the time.  Doesn't talk / converse at all - at baseline.  She is concerned because as he started to get choked on his pills he then started to cough  Labs:  I  personally viewed and interpreted the labs which show CBC without leukocytosis or anemia, metabolic panel with creatinine of 0.9  Urinalysis unable to obtain as the patient is extremely dry and dehydrated, magnesium is normal   Radiology Imaging: I personally viewed the images of the ordered radiographic studies and find some developing patchy airspace opacities in the right lung I agree with the radiologist interpretation as well   Overall the patient's presentation is decompensated from his very fragile baseline and at this point likely has developing pneumonia and cannot take oral pills.  Will admit to hospitalist for likely aspiration pneumonia in the presence of failure to thrive.  Family agreeable  Consultations: I discussed the case with hospitalist, they recommend admission - appreciate Dr. Adefeso for admitting.  Medication management: Unasyn       Final diagnoses:  Aspiration pneumonia due to regurgitated food, unspecified laterality,  unspecified part of lung [facility])    ED Discharge Orders     None          Cleotilde Rogue, MD 11/28/23 7756    Cleotilde Rogue, MD 11/28/23 2302

## 2023-11-28 NOTE — ED Notes (Signed)
 Assumed care of pt, found him alert in a hall bed w/ a pleasant disposition.  Pt is not oriented to situation or date but was able to tell me who he is and where he is.  Pt denies any concerns including pain, not feeling well and states he has been eating but could not tell me what.

## 2023-11-29 ENCOUNTER — Encounter (HOSPITAL_COMMUNITY): Payer: Self-pay | Admitting: Internal Medicine

## 2023-11-29 DIAGNOSIS — R627 Adult failure to thrive: Secondary | ICD-10-CM | POA: Insufficient documentation

## 2023-11-29 DIAGNOSIS — J690 Pneumonitis due to inhalation of food and vomit: Secondary | ICD-10-CM | POA: Diagnosis not present

## 2023-11-29 DIAGNOSIS — R569 Unspecified convulsions: Secondary | ICD-10-CM

## 2023-11-29 LAB — COMPREHENSIVE METABOLIC PANEL WITH GFR
ALT: 34 U/L (ref 0–44)
AST: 27 U/L (ref 15–41)
Albumin: 3.9 g/dL (ref 3.5–5.0)
Alkaline Phosphatase: 69 U/L (ref 38–126)
Anion gap: 10 (ref 5–15)
BUN: 15 mg/dL (ref 8–23)
CO2: 28 mmol/L (ref 22–32)
Calcium: 8.9 mg/dL (ref 8.9–10.3)
Chloride: 105 mmol/L (ref 98–111)
Creatinine, Ser: 0.79 mg/dL (ref 0.61–1.24)
GFR, Estimated: >60 mL/min (ref >60)
Glucose, Bld: 87 mg/dL (ref 70–99)
Potassium: 3.4 mmol/L — ABNORMAL LOW (ref 3.5–5.1)
Sodium: 143 mmol/L (ref 135–145)
Total Bilirubin: 0.7 mg/dL (ref 0.0–1.2)
Total Protein: 6.2 g/dL — ABNORMAL LOW (ref 6.5–8.1)

## 2023-11-29 LAB — CBC
HCT: 38.6 % — ABNORMAL LOW (ref 39.0–52.0)
Hemoglobin: 12.9 g/dL — ABNORMAL LOW (ref 13.0–17.0)
MCH: 29.5 pg (ref 26.0–34.0)
MCHC: 33.4 g/dL (ref 30.0–36.0)
MCV: 88.1 fL (ref 80.0–100.0)
Platelets: 140 K/uL — ABNORMAL LOW (ref 150–400)
RBC: 4.38 MIL/uL (ref 4.22–5.81)
RDW: 12.8 % (ref 11.5–15.5)
WBC: 4.6 K/uL (ref 4.0–10.5)
nRBC: 0 % (ref 0.0–0.2)

## 2023-11-29 LAB — PHOSPHORUS: Phosphorus: 2.9 mg/dL (ref 2.5–4.6)

## 2023-11-29 LAB — PROCALCITONIN: Procalcitonin: <0.1 ng/mL

## 2023-11-29 MED ORDER — MEMANTINE HCL 10 MG PO TABS
10.0000 mg | ORAL_TABLET | Freq: Two times a day (BID) | ORAL | Status: DC
  Administered 2023-11-29 – 2023-11-30 (×2): 10 mg via ORAL
  Filled 2023-11-29 (×2): qty 1

## 2023-11-29 MED ORDER — ACETAMINOPHEN 650 MG RE SUPP: 650.0000 mg | Freq: Four times a day (QID) | RECTAL | Status: DC | PRN

## 2023-11-29 MED ORDER — ACETAMINOPHEN 325 MG PO TABS: 650.0000 mg | ORAL_TABLET | Freq: Four times a day (QID) | ORAL | Status: DC | PRN

## 2023-11-29 MED ORDER — SODIUM CHLORIDE 0.9 % IV SOLN: INTRAVENOUS | Status: AC

## 2023-11-29 MED ORDER — DM-GUAIFENESIN ER 30-600 MG PO TB12
1.0000 | ORAL_TABLET | Freq: Two times a day (BID) | ORAL | Status: DC
  Administered 2023-11-29 – 2023-11-30 (×2): 1 via ORAL
  Filled 2023-11-29 (×2): qty 1

## 2023-11-29 MED ORDER — DONEPEZIL HCL 5 MG PO TABS
10.0000 mg | ORAL_TABLET | Freq: Every day | ORAL | Status: DC
  Administered 2023-11-29: 10 mg via ORAL
  Filled 2023-11-29: qty 2

## 2023-11-29 MED ORDER — APIXABAN 5 MG PO TABS
5.0000 mg | ORAL_TABLET | Freq: Two times a day (BID) | ORAL | Status: DC
  Administered 2023-11-29 – 2023-11-30 (×2): 5 mg via ORAL
  Filled 2023-11-29 (×2): qty 1

## 2023-11-29 MED ORDER — ONDANSETRON HCL 4 MG PO TABS: 4.0000 mg | ORAL_TABLET | Freq: Four times a day (QID) | ORAL | Status: DC | PRN

## 2023-11-29 MED ORDER — ROSUVASTATIN CALCIUM 20 MG PO TABS
20.0000 mg | ORAL_TABLET | Freq: Every morning | ORAL | Status: DC
  Administered 2023-11-30: 20 mg via ORAL
  Filled 2023-11-29: qty 1

## 2023-11-29 MED ORDER — ADULT MULTIVITAMIN W/MINERALS CH
1.0000 | ORAL_TABLET | Freq: Every day | ORAL | Status: DC
Start: 2023-11-29 — End: 2023-11-30
  Administered 2023-11-30: 1 via ORAL
  Filled 2023-11-29: qty 1

## 2023-11-29 MED ORDER — ONDANSETRON HCL 4 MG/2ML IJ SOLN: 4.0000 mg | Freq: Four times a day (QID) | INTRAMUSCULAR | Status: DC | PRN

## 2023-11-29 MED ORDER — SODIUM CHLORIDE 0.9 % IV SOLN
1.0000 g | INTRAVENOUS | Status: DC
  Administered 2023-11-29 – 2023-11-30 (×2): 1 g via INTRAVENOUS
  Filled 2023-11-29 (×2): qty 10

## 2023-11-29 MED ORDER — LEVETIRACETAM 500 MG PO TABS
1000.0000 mg | ORAL_TABLET | Freq: Two times a day (BID) | ORAL | Status: DC
  Administered 2023-11-29 – 2023-11-30 (×2): 1000 mg via ORAL
  Filled 2023-11-29 (×2): qty 2

## 2023-11-29 MED ORDER — SODIUM CHLORIDE 0.9 % IV SOLN
500.0000 mg | INTRAVENOUS | Status: DC
  Administered 2023-11-29 (×2): 500 mg via INTRAVENOUS
  Filled 2023-11-29 (×2): qty 5

## 2023-11-29 MED ORDER — ENSURE PLUS HIGH PROTEIN PO LIQD
237.0000 mL | Freq: Three times a day (TID) | ORAL | Status: DC
  Administered 2023-11-29 – 2023-11-30 (×2): 237 mL via ORAL
  Filled 2023-11-29 (×4): qty 237

## 2023-11-29 NOTE — Progress Notes (Signed)
  Transition of Care (TOC) Screening Note   Patient Details  Name: Jonathan Poole Date of Birth: 08-12-1955   Transition of Care [facility]) CM/SW Contact:    Hoy LABOR Bigness, LCSW Phone Number: 11/29/2023, 10:59 AM    Transition of Care Department [facility]) has reviewed patient and no TOC needs have been identified at this time. We will continue to monitor patient advancement through interdisciplinary progression rounds. If new patient transition needs arise, please place a TOC consult.    11/29/23 1059  TOC Brief Assessment  Insurance and Status Reviewed  Patient has primary care physician Yes  Home environment has been reviewed Lives at home w/ spouse  Prior level of function: Independent  Prior/Current Home Services No current home services  Social Drivers of Health Review SDOH reviewed no interventions necessary  Readmission risk has been reviewed Yes  Transition of care needs transition of care needs identified, TOC will continue to follow

## 2023-11-29 NOTE — Evaluation (Signed)
 Clinical/Bedside Swallow Evaluation Patient Details  Name: Jonathan Poole MRN: [id number] Date of Birth: 1955-09-23  Today's Date: 11/29/2023 Time: SLP Start Time (ACUTE ONLY): 1410 SLP Stop Time (ACUTE ONLY): 1432 SLP Time Calculation (min) (ACUTE ONLY): 22 min  Past Medical History:  Past Medical History:  Diagnosis Date   Atrial fibrillation (HCC)    Dementia (HCC)    Heart disease    Hyperchloremia    Seizures (HCC)    Past Surgical History:  Past Surgical History:  Procedure Laterality Date   PACEMAKER IMPLANT     PPM GENERATOR CHANGEOUT N/A 01/02/2020   Procedure: PPM GENERATOR CHANGEOUT;  Surgeon: Waddell Danelle ORN, MD;  Location: [facility];  Service: Cardiovascular;  Laterality: N/A;   HPI:  Jonathan Poole is a 68 y.o. male with medical history significant of  hyperlipidemia, atrial fibrillation, dementia, seizures who presents to the emergency department from home via EMS due to not eating or drinking or taking his medications in the last 4 days.  At bedside, patient was able to tell me his name and was aware of being in the hospital.  He states that he came to the hospital to see his wife who was sick (patient has a history of dementia).  He did not know what year it is and did not know the name of the president of the [place] .  History was obtained from EDP and ED medical records.  Per report, wife reported that patient was choking on pills, so he stopped taking his medication, he has been talking less and has not been eating or drinking as mentioned above.  He was also reported to have about frequent falls within the last 1 to 2 weeks. Wife activated EMS and patient was sent to the ED for further evaluation and management.  At baseline, patient usually gets around the house without the use of any assistive device. Chest x-ray was suggestive of developing patchy airspace opacity at the right lower lung zone.BSE requested. Pt is currently NPO.    Assessment / Plan /  Recommendation  Clinical Impression  Clinical swallow evaluation completed at bedside and Pt alert and upright. He denies difficulty swallowing. Oral motor examination is WNL, no gross facial asymmetry. Pt assessed with ice chips, thin water via cup/straw, puree, and regular textures. Pt does not present with any overt signs or symptoms of aspiration and no reports of globus. Recommend regular textures and thin liquids, straws or cup sips ok, and PO medication whole with water with intermittent supervision. No further SLP follow up indicated. SLP Visit Diagnosis: Dysphagia, unspecified (R13.10)    Aspiration Risk  No limitations    Diet Recommendation Regular;Thin liquid    Liquid Administration via: Cup;Straw Medication Administration: Whole meds with liquid Supervision: Patient able to self feed;Intermittent supervision to cue for compensatory strategies Postural Changes: Seated upright at 90 degrees;Remain upright for at least 30 minutes after po intake    Other  Recommendations Oral Care Recommendations: Oral care BID;Staff/trained caregiver to provide oral care     Assistance Recommended at Discharge    Functional Status Assessment Patient has not had a recent decline in their functional status  Frequency and Duration            Prognosis Prognosis for improved oropharyngeal function: Good Barriers to Reach Goals: Cognitive deficits      Swallow Study   General Date of Onset: 11/28/23 HPI: Jonathan Poole is a 68 y.o. male with medical history significant of  hyperlipidemia, atrial fibrillation, dementia, seizures who presents to the emergency department from home via EMS due to not eating or drinking or taking his medications in the last 4 days.  At bedside, patient was able to tell me his name and was aware of being in the hospital.  He states that he came to the hospital to see his wife who was sick (patient has a history of dementia).  He did not know what year it is and  did not know the name of the president of the [place] .  History was obtained from EDP and ED medical records.  Per report, wife reported that patient was choking on pills, so he stopped taking his medication, he has been talking less and has not been eating or drinking as mentioned above.  He was also reported to have about frequent falls within the last 1 to 2 weeks. Wife activated EMS and patient was sent to the ED for further evaluation and management.  At baseline, patient usually gets around the house without the use of any assistive device. Chest x-ray was suggestive of developing patchy airspace opacity at the right lower lung zone.BSE requested. Pt is currently NPO. Type of Study: Bedside Swallow Evaluation Previous Swallow Assessment: N/A Diet Prior to this Study: NPO Temperature Spikes Noted: No Respiratory Status: Room air History of Recent Intubation: No Behavior/Cognition: Alert;Cooperative;Pleasant mood Oral Cavity Assessment: Within Functional Limits Oral Care Completed by SLP: Yes Oral Cavity - Dentition: Adequate natural dentition;Missing dentition Vision: Functional for self-feeding Self-Feeding Abilities: Able to feed self Patient Positioning: Upright in bed Baseline Vocal Quality: Normal Volitional Cough: Strong Volitional Swallow: Able to elicit    Oral/Motor/Sensory Function Overall Oral Motor/Sensory Function: Within functional limits   Ice Chips Ice chips: Within functional limits Presentation: Spoon   Thin Liquid Thin Liquid: Within functional limits Presentation: Cup;Self Fed;Straw    Nectar Thick Nectar Thick Liquid: Not tested   Honey Thick Honey Thick Liquid: Not tested   Puree Puree: Within functional limits Presentation: Spoon   Solid     Solid: Within functional limits Presentation: Self Fed     Thank you,  Lamar Candy, CCC-SLP [phone]  Julisa Flippo 11/29/2023,3:07 PM

## 2023-11-29 NOTE — Progress Notes (Signed)
 PROGRESS NOTE    Jonathan Poole  FMW:[id number] DOB: 04-03-1955 DOA: 11/28/2023 PCP: [name] [name], MD   Brief Narrative:  68 year old male with history of dementia, atrial fibrillation, seizure who presented to the emergency department, brought by EMS from home due to for evaluation of poor intake, failure to thrive, patient not eating or drinking or taking medications for the last for 5 days.  She has also been having frequent falls. In the ER, found to have pneumonia on chest x-ray.  Started on IV antibiotics.  Assessment & Plan:   Principal Problem:   Aspiration pneumonia (HCC) Active Problems:   Dementia with behavioral disturbance (HCC)   Chronic atrial fibrillation (HCC)   Mixed hyperlipidemia   Recurrent falls   Failure to thrive in adult   Seizure [facility])    Presumed aspiration pneumonia Continue azithromycin.  Add ceftriaxone.  Follow-up on cultures. Patient is not hypoxic Continue Tylenol  as needed Continue Mucinex, incentive spirometry, flutter valve  Bedside swallow eval prior to oral intake Continue SLP eval and treat prior to oral intake   Failure to thrive in adult Protein supplement will be provided Dietitian will be consulted and we shall await further recommendations   Recurrent falls He was reported to have frequent falls within the last 1 to 2 weeks Continue fall precaution Consult PT/OT eval and treat   Dementia Continue Aricept  and Namenda , multivitamins and nutritional supplements.  Continue delirium precaution   Chronic atrial fibrillation Continue Eliquis  Patient was not on any rate control medication per med rec   Seizure disorder Continue Keppra    Mixed hyperlipidemia Continue rosuvastatin      DVT prophylaxis: Eliquis    Code Status: DNR   Family Communication: Discussed with patient's wife over the phone.  Consults: None   Severity of Illness: The appropriate patient status for this patient is INPATIENT. Inpatient status  is judged to be reasonable and necessary in order to provide the required intensity of service to ensure the patient's safety. The patient's presenting symptoms, physical exam findings, and initial radiographic and laboratory data in the context of their chronic comorbidities is felt to place them at high risk for further clinical deterioration. Furthermore, it is not anticipated that the patient will be medically stable for discharge from the hospital within 2 midnights of admission.    * I certify that at the point of admission it is my clinical judgment that the patient will require inpatient hospital care spanning beyond 2 midnights from the point of admission due to high intensity of service, high risk for further deterioration and high frequency of surveillance required.*   Subjective:  Patient seen and examined at the bedside.  He is sitting up on the bed.  He is not in any acute distress.  He denies any concerns.  He is confused consistent with baseline dementia. Objective: Vitals:   11/29/23 1230 11/29/23 1235 11/29/23 1237 11/29/23 1255  BP:   122/73 127/80  Pulse: 73 77  65  Resp:    17  Temp:    98.3 F (36.8 C)  TempSrc:    Oral  SpO2: 95% 95%  99%  Weight:      Height:        Intake/Output Summary (Last 24 hours) at 11/29/2023 1514 Last data filed at 11/29/2023 1237 Gross per 24 hour  Intake 1079.92 ml  Output --  Net 1079.92 ml   Filed Weights   11/28/23 2026  Weight: 71.2 kg    Examination:  General exam:  Appears calm and comfortable  Respiratory system: Bilateral decreased breath sounds at bases Cardiovascular system: S1 & S2 heard, Rate controlled Gastrointestinal system: Abdomen is nondistended, soft and nontender. Normal bowel sounds heard. Extremities: No cyanosis, clubbing, edema  Central nervous system: Alert and confused, baseline dementia.  Moving all extremities equally.  No focal deficits Skin: No rashes, lesions or ulcers   Data Reviewed: I  have personally reviewed following labs and imaging studies  CBC: Recent Labs  Lab 11/28/23 2112 11/29/23 0602  WBC 4.8 4.6  HGB 13.8 12.9*  HCT 42.6 38.6*  MCV 88.6 88.1  PLT 147* 140*   Basic Metabolic Panel: Recent Labs  Lab 11/28/23 2112 11/29/23 0602  NA 141 143  K 3.7 3.4*  CL 101 105  CO2 28 28  GLUCOSE 100* 87  BUN 18 15  CREATININE 0.94 0.79  CALCIUM  10.0 8.9  MG 2.3  --   PHOS  --  2.9   GFR: Estimated Creatinine Clearance: 89 mL/min (by C-G formula based on SCr of 0.79 mg/dL). Liver Function Tests: Recent Labs  Lab 11/28/23 2112 11/29/23 0602  AST 34 27  ALT 42 34  ALKPHOS 82 69  BILITOT 0.6 0.7  PROT 7.2 6.2*  ALBUMIN 4.4 3.9   No results for input(s): LIPASE, AMYLASE in the last 168 hours. No results for input(s): AMMONIA in the last 168 hours. Coagulation Profile: No results for input(s): INR, PROTIME in the last 168 hours. Cardiac Enzymes: No results for input(s): CKTOTAL, CKMB, CKMBINDEX, TROPONINI in the last 168 hours. BNP (last 3 results) No results for input(s): PROBNP in the last 8760 hours. HbA1C: No results for input(s): HGBA1C in the last 72 hours. CBG: No results for input(s): GLUCAP in the last 168 hours. Lipid Profile: No results for input(s): CHOL, HDL, LDLCALC, TRIG, CHOLHDL, LDLDIRECT in the last 72 hours. Thyroid  Function Tests: No results for input(s): TSH, T4TOTAL, FREET4, T3FREE, THYROIDAB in the last 72 hours. Anemia Panel: No results for input(s): VITAMINB12, FOLATE, FERRITIN, TIBC, IRON, RETICCTPCT in the last 72 hours. Sepsis Labs: Recent Labs  Lab 11/29/23 0602  PROCALCITON <0.10    Recent Results (from the past 240 hours)  Culture, blood (routine x 2) Call MD if unable to obtain prior to antibiotics being given     Status: None (Preliminary result)   Collection Time: 11/29/23 12:46 AM   Specimen: BLOOD RIGHT ARM  Result Value Ref Range Status    Specimen Description BLOOD RIGHT ARM  Final   Special Requests AEROBIC BOTTLE ONLY Blood Culture adequate volume  Final   Culture   Final    NO GROWTH < 12 HOURS Performed at [facility], [address] [place], [place] [address]    Report Status PENDING  Incomplete  Culture, blood (routine x 2) Call MD if unable to obtain prior to antibiotics being given     Status: None (Preliminary result)   Collection Time: 11/29/23 12:53 AM   Specimen: BLOOD LEFT FOREARM  Result Value Ref Range Status   Specimen Description BLOOD LEFT FOREARM  Final   Special Requests AEROBIC BOTTLE ONLY Blood Culture adequate volume  Final   Culture   Final    NO GROWTH < 12 HOURS Performed at [facility], [address] [place], [place] [address]    Report Status PENDING  Incomplete         Radiology Studies: DG Chest 1 View Result Date: 11/28/2023 EXAM: 1 VIEW XRAY OF THE CHEST 11/28/2023 08:43:00 PM  COMPARISON: Chest x-ray 07/28/2023. CLINICAL HISTORY: 89968 Cough 10031. Pt bib EMS from home after wife reports decline in general health. Pt with advanced alzheimer's and per wife, pt stopped eating and drinking and taking his medications 4 days ago. Wife also reported to EMS that pt has had several falls over the last 2 ; weeks as well. EDP at bedside. FINDINGS: LINES, TUBES AND DEVICES: Right chest wall 2 lead dialysis catheter. LUNGS AND PLEURA: Chronic coarsening interstitial markings. No overt pulmonary edema. Question developing patchy airspace opacity at the right lower lung zone. No pleural effusion. No pneumothorax. HEART AND MEDIASTINUM: Atherosclerotic plaque. No acute abnormality of the cardiac and mediastinal silhouettes. BONES AND SOFT TISSUES: No acute osseous abnormality. IMPRESSION: 1. Questionable developing patchy airspace opacity at the right lower lung zone. Recommend repeat chest x-ray PA and lateral view for further evaluation. Electronically signed by: Morgane Naveau MD 11/28/2023 08:55 PM  EDT RP Workstation: [id number]        Scheduled Meds:  apixaban   5 mg Oral BID   dextromethorphan-guaiFENesin  1 tablet Oral BID   donepezil   10 mg Oral QHS   feeding supplement  237 mL Oral TID BM   levETIRAcetam   1,000 mg Oral BID   memantine   10 mg Oral BID   multivitamin with minerals  1 tablet Oral Daily   rosuvastatin   20 mg Oral q AM   Continuous Infusions:  sodium chloride  50 mL/hr at 11/29/23 1441   azithromycin Stopped (11/29/23 0155)   cefTRIAXone (ROCEPHIN)  IV Stopped (11/29/23 1237)          Carrieann Spielberg, MD [facility] 11/29/2023, 3:14 PM

## 2023-11-29 NOTE — ED Notes (Signed)
 Pt found sitting on the side of his bed.  He was not sure what he was doing or if he needed anything but was very pleasant w/ staff.  Pt was able to stand w/ a stand by assist.  He had on a wet brief which was changed along w/ his bed linens and he was placed in a hospital gown.  He was compliant w/ getting back in bed and was very pleased to get a warm blanket.  Bed alarm was turned on and lights were turned down w/ the curtain left open for observation.  No needs identified at this time.

## 2023-11-29 NOTE — ED Notes (Signed)
 Out of bed, returned to stretcher, NAD, calm, agreeable, redirectable, pleasantly confused. Bed alarm activated.

## 2023-11-30 DIAGNOSIS — J690 Pneumonitis due to inhalation of food and vomit: Secondary | ICD-10-CM | POA: Diagnosis not present

## 2023-11-30 MED ORDER — CEFDINIR 300 MG PO CAPS
300.0000 mg | ORAL_CAPSULE | Freq: Two times a day (BID) | ORAL | 0 refills | Status: DC
Start: 2023-11-30 — End: 2023-12-17

## 2023-11-30 NOTE — Plan of Care (Signed)
  Problem: Acute Rehab OT Goals (only OT should resolve) Goal: Pt. Will Perform Grooming Flowsheets (Taken 11/30/2023 1339) Pt Will Perform Grooming:  with modified independence  standing Goal: Pt. Will Perform Lower Body Dressing Flowsheets (Taken 11/30/2023 1339) Pt Will Perform Lower Body Dressing: with modified independence Goal: Pt. Will Perform Toileting-Clothing Manipulation Flowsheets (Taken 11/30/2023 1339) Pt Will Perform Toileting - Clothing Manipulation and hygiene: with modified independence Goal: Pt/Caregiver Will Perform Home Exercise Program Flowsheets (Taken 11/30/2023 1339) Pt/caregiver will Perform Home Exercise Program:  Increased strength  Increased ROM  Both right and left upper extremity  Independently  Lorilei Horan OT, MOT

## 2023-11-30 NOTE — Progress Notes (Signed)
 This pt remains confused, attempted to get out of bed several times throughout the night. Pt did not sleep at all, is alert and awake. Will continue to monitor.

## 2023-11-30 NOTE — TOC Transition Note (Signed)
 Transition of Care [facility]) - Discharge Note   Patient Details  Name: Jonathan Poole MRN: [id number] Date of Birth: Apr 05, 1955  Transition of [facility]) CM/SW Contact:  Noreen KATHEE Pinal, LCSWA Phone Number: 11/30/2023, 1:02 PM   Clinical Narrative:     Patient is discharging today back home. CSW spoke with patient at bedside to discuss PT recommendation for HHPT. Patient agreeable and had no agency preferences. Cory with [facility] was able to accept referral based on patient insurance. CSW attempted to contact wife regarding DC and no answer. CSW signing off.   Final next level of care: Home w Home Health Services Barriers to Discharge: Barriers Resolved   Patient Goals and CMS Choice Patient states their goals for this hospitalization and ongoing recovery are:: return back home CMS Medicare.gov Compare Post Acute Care list provided to:: Patient Choice offered to / list presented to : Patient      Discharge Placement                Patient to be transferred to facility by: POV Name of family member notified: Fawzi Patient and family notified of of transfer: 11/30/23  Discharge Plan and Services Additional resources added to the After Visit Summary for                            [facility] Arranged: PT HH Agency: [facility] Date [facility] Agency Contacted: 11/30/23 Time HH Agency Contacted: 1249 Representative spoke with at [facility] Agency: Darleene  Social Drivers of Health (SDOH) Interventions SDOH Screenings   Food Insecurity: No Food Insecurity (11/02/2023)  Housing: Low Risk  (11/02/2023)  Transportation Needs: No Transportation Needs (11/02/2023)  Utilities: Not At Risk (11/02/2023)  Alcohol  Screen: Low Risk  (04/10/2023)  Depression (PHQ2-9): Medium Risk (11/02/2023)  Financial Resource Strain: High Risk (11/18/2023)  Physical Activity: Inactive (04/10/2023)  Social Connections: Patient Unable To Answer (07/29/2023)  Stress: No Stress Concern Present (04/10/2023)   Tobacco Use: Medium Risk (11/29/2023)  Health Literacy: Inadequate Health Literacy (11/16/2023)     Readmission Risk Interventions    11/30/2023   12:49 PM 11/29/2023   10:59 AM 08/03/2023   11:59 AM  Readmission Risk Prevention Plan  Transportation Screening Complete Complete Complete  PCP or Specialist Appt within 5-7 Days  Complete   Home Care Screening Complete Complete Complete  Medication Review (RN CM) Complete Complete Complete

## 2023-11-30 NOTE — Plan of Care (Signed)
  Problem: Clinical Measurements: Goal: Will remain free from infection Outcome: Progressing   Problem: Activity: Goal: Risk for activity intolerance will decrease Outcome: Progressing   Problem: Pain Managment: Goal: General experience of comfort will improve and/or be controlled Outcome: Progressing   Problem: Clinical Measurements: Goal: Ability to maintain a body temperature in the normal range will improve Outcome: Progressing

## 2023-11-30 NOTE — Evaluation (Signed)
 Occupational Therapy Evaluation Patient Details Name: Jonathan Poole MRN: [id number] DOB: 1956/01/26 Today's Date: 11/30/2023   History of Present Illness   Jonathan Poole is a 68 y.o. male with medical history significant of  hyperlipidemia, atrial fibrillation, dementia, seizures who presents to the emergency department from home via EMS due to not eating or drinking or taking his medications in the last 4 days.  At bedside, patient was able to tell me his name and was aware of being in the hospital.  He states that he came to the hospital to see his wife who was sick (patient has a history of dementia).  He did not know what year it is and did not know the name of the president of the [place] .  History was obtained from EDP and ED medical records.  Per report, wife reported that patient was choking on pills, so he stopped taking his medication, he has been talking less and has not been eating or drinking as mentioned above.  He was also reported to have about frequent falls within the last 1 to 2 weeks. Wife activated EMS and patient was sent to the ED for further evaluation and management.  At baseline, patient usually gets around the house without the use of any assistive device. (per DO)     Clinical Impressions Pt agreeable to OT and PT co-evaluation. No family present to confirm living history. Pt and chart indicate wife is present 24/7. Pt required supervision assist for seated ADL's via need for verbal cuing to sustain attention to task and follow sequences. CGA to min A for transfers without AD due to mild instability in standing. Pt's B UE are weak with mild A/ROM deficits for shoulder flexion. Pt left in the chair with call bell within reach and chair alarm set. Pt will benefit from continued OT in the hospital to increase strength, balance, and endurance for safe ADL's.        If plan is discharge home, recommend the following:   A little help with walking and/or transfers;A  little help with bathing/dressing/bathroom;Assistance with cooking/housework;Assist for transportation;Help with stairs or ramp for entrance;Direct supervision/assist for medications management     Functional Status Assessment   Patient has had a recent decline in their functional status and demonstrates the ability to make significant improvements in function in a reasonable and predictable amount of time.     Equipment Recommendations   None recommended by OT             Precautions/Restrictions   Precautions Precautions: Fall Recall of Precautions/Restrictions: Impaired Restrictions Weight Bearing Restrictions Per Provider Order: No     Mobility Bed Mobility Overal bed mobility: Needs Assistance Bed Mobility: Supine to Sit     Supine to sit: Supervision     General bed mobility comments: No physical assist needed.    Transfers Overall transfer level: Needs assistance   Transfers: Sit to/from Stand, Bed to chair/wheelchair/BSC Sit to Stand: Contact guard assist, Min assist     Step pivot transfers: Min assist, Contact guard assist     General transfer comment: Mildly unsteady in standing and needing a couple attempts at sit to stand. EOB to chair without RW      Balance Overall balance assessment: Needs assistance Sitting-balance support: Feet supported, No upper extremity supported Sitting balance-Leahy Scale: Fair Sitting balance - Comments: fair to good seated at EOB   Standing balance support: No upper extremity supported, During functional activity Standing balance-Leahy Scale: Fair Standing  balance comment: without AD                           ADL either performed or assessed with clinical judgement   ADL Overall ADL's : Needs assistance/impaired     Grooming: Contact guard assist;Standing   Upper Body Bathing: Set up;Sitting   Lower Body Bathing: Set up;Supervison/ safety;Sitting/lateral leans   Upper Body Dressing : Set  up;Sitting   Lower Body Dressing: Set up;Supervision/safety;Sitting/lateral leans Lower Body Dressing Details (indicate cue type and reason): Able to don socks in long sit in bed with verbal cuing. Toilet Transfer: Contact guard assist;Minimal Cabin crew Details (indicate cue type and reason): EOB to chair without AD. Toileting- Clothing Manipulation and Hygiene: Set up;Supervision/safety;Sitting/lateral lean       Functional mobility during ADLs: Contact guard assist;Rolling walker (2 wheels) General ADL Comments: Ambualted in the hall without AD.     Vision Baseline Vision/History: 0 No visual deficits Ability to See in Adequate Light: 0 Adequate Patient Visual Report: No change from baseline Vision Assessment?: No apparent visual deficits     Perception Perception: Not tested       Praxis Praxis: Not tested       Pertinent Vitals/Pain Pain Assessment Pain Assessment: No/denies pain     Extremity/Trunk Assessment Upper Extremity Assessment Upper Extremity Assessment: Generalized weakness (3-/5 shoulder flexion; 3+/5 grip; generally weak; Mild limited A/ROM for bilateral shoulder flexion.)   Lower Extremity Assessment Lower Extremity Assessment: Defer to PT evaluation   Cervical / Trunk Assessment Cervical / Trunk Assessment: Kyphotic   Communication Communication Communication: No apparent difficulties   Cognition Arousal: Alert Behavior During Therapy: WFL for tasks assessed/performed Cognition: History of cognitive impairments, Cognition impaired   Orientation impairments: Place, Time, Situation     Attention impairment (select first level of impairment): Sustained attention Executive functioning impairment (select all impairments): Sequencing OT - Cognition Comments: Pt requried extended time and additional verbal cuing to follow commands today. History of dementia.                 Following commands: Intact        Cueing  General Comments   Cueing Techniques: Verbal cues;Tactile cues;Visual cues                 Home Living Family/patient expects to be discharged to:: Private residence Living Arrangements: Spouse/significant other Available Help at Discharge: Family;Available 24 hours/day Type of Home: House Home Access: Level entry     Home Layout: One level     Bathroom Shower/Tub: Chief Strategy Officer: Standard Bathroom Accessibility: Yes   Home Equipment: None   Additional Comments: Information per chart and some patient report. Pt is a poor historian.      Prior Functioning/Environment Prior Level of Function : Patient poor historian/Family not available             Mobility Comments: Pt reports independent ambulation. ADLs Comments: Pt reports independence with ADL's and assist for IADL's.    OT Problem List: Decreased strength;Decreased activity tolerance;Decreased range of motion;Impaired balance (sitting and/or standing);Decreased cognition   OT Treatment/Interventions: Self-care/ADL training;Therapeutic exercise;DME and/or AE instruction;Therapeutic activities;Cognitive remediation/compensation;Patient/family education      OT Goals(Current goals can be found in the care plan section)   Acute Rehab OT Goals Patient Stated Goal: improve function OT Goal Formulation: With patient Time For Goal Achievement: 12/14/23 Potential to Achieve Goals: Good   OT Frequency:  Min 2X/week    Co-evaluation PT/OT/SLP Co-Evaluation/Treatment: Yes Reason for Co-Treatment: To address functional/ADL transfers   OT goals addressed during session: ADL's and self-care                       End of Session Equipment Utilized During Treatment: Gait belt  Activity Tolerance: Patient tolerated treatment well Patient left: in chair;with call bell/phone within reach;with chair alarm set  OT Visit Diagnosis: Unsteadiness on feet (R26.81);Other  abnormalities of gait and mobility (R26.89);Muscle weakness (generalized) (M62.81);Other symptoms and signs involving cognitive function                Time: 8962-8942 OT Time Calculation (min): 20 min Charges:  OT General Charges $OT Visit: 1 Visit OT Evaluation $OT Eval Low Complexity: 1 Low  Starlin Steib OT, MOT  Jayson Person 11/30/2023, 1:36 PM

## 2023-11-30 NOTE — Discharge Summary (Signed)
 Physician Discharge Summary  Jonathan Poole FMW:[id number] DOB: Dec 30, 1955 DOA: 11/28/2023  PCP: [name] [name], MD  Admit date: 11/28/2023 Discharge date: 11/30/2023  Admitted From: Home Disposition: HH  Recommendations for Outpatient Follow-up:  Follow up with PCP in 1 week  Follow up in ED if symptoms worsen or new appear  Discharge Condition: Stable CODE STATUS: DNR Diet recommendation: regular  Brief/Interim Summary:  Brief Narrative:  68 year old male with history of dementia, atrial fibrillation, seizure who presented to the emergency department, brought by EMS from home due to for evaluation of poor intake, failure to thrive, patient not eating or drinking or taking medications for the last for 5 days.  She has also been having frequent falls. In the ER, found to have pneumonia on chest x-ray.  Started on IV antibiotics.     Presumed aspiration pneumonia Patient was not hypoxic.  Started on IV ceftriaxone and azithromycin with chest x-ray findings concerning for pneumonia. Patient felt better.  Wife reported drooling and inability to swallow however he was able to swallow well per speech therapy evaluation. Discharge Home, prescribed Omnicef to complete 7 days of therapy.    Failure to thrive in adult Protein supplement will be provided Dietitian will be consulted and we shall await further recommendations   Recurrent falls He was reported to have frequent falls within the last 1 to 2 weeks Continue fall precaution Consult PT/OT eval and treat   Dementia Continue Aricept  and Namenda , multivitamins and nutritional supplements.  Patient is also on Seroquel, continued Patient need to follow-up with PCP and discussed multiple medications which could cause dizziness and falls Continue delirium precaution   Chronic atrial fibrillation Continue Eliquis  Patient was not on any rate control medication per med rec   Seizure disorder Continue Keppra    Mixed  hyperlipidemia Continue rosuvastatin      Discharge Diagnoses:  Principal Problem:   Aspiration pneumonia (HCC) Active Problems:   Dementia with behavioral disturbance (HCC)   Chronic atrial fibrillation (HCC)   Mixed hyperlipidemia   Recurrent falls   Failure to thrive in adult   Seizure [facility])    Discharge Instructions  Discharge Instructions     Call MD for:  persistant nausea and vomiting   Complete by: As directed    Call MD for:  severe uncontrolled pain   Complete by: As directed    Call MD for:  temperature >100.4   Complete by: As directed    Diet general   Complete by: As directed    Discharge instructions   Complete by: As directed    1.  Complete a course of antibiotic for pneumonia. 2.  Continue prior to admission medications. 3.  Return to the ER if recurrence of symptoms.   Face-to-face encounter (required for Medicare/Medicaid patients)   Complete by: As directed    I Creighton Longley certify that this patient is under my care and that I, or a nurse practitioner or physician's assistant working with me, had a face-to-face encounter that meets the physician face-to-face encounter requirements with this patient on 11/30/2023. The encounter with the patient was in whole, or in part for the following medical condition(s) which is the primary reason for home health care (List medical condition): dementia, pneumonia   The encounter with the patient was in whole, or in part, for the following medical condition, which is the primary reason for home health care: Pneumonia, dementia   I certify that, based on my findings, the following services are medically necessary  home health services: Physical therapy   Reason for Medically Necessary Home Health Services: Therapy- Home Adaptation to Facilitate Safety   My clinical findings support the need for the above services: Cognitive impairments, dementia, or mental confusion  that make it unsafe to leave home   Further, I  certify that my clinical findings support that this patient is homebound due to: Mental confusion   Home Health   Complete by: As directed    To provide the following care/treatments: PT   Increase activity slowly   Complete by: As directed       Allergies as of 11/30/2023   No Known Allergies      Medication List     TAKE these medications    acetaminophen  500 MG tablet Commonly known as: TYLENOL  Take 500-1,000 mg by mouth every 6 (six) hours as needed (when not taking Ibuprofen).   apixaban  5 MG Tabs tablet Commonly known as: Eliquis  Take 1 tablet (5 mg total) by mouth 2 (two) times daily. NEEDS CARDIOLOGY APPT, CALL OFFICE [phone].  THANK YOU   cefdinir 300 MG capsule Commonly known as: OMNICEF Take 1 capsule (300 mg total) by mouth 2 (two) times daily.   cyanocobalamin  1000 MCG/ML injection Commonly known as: VITAMIN B12 Inject 1,000 mcg into the muscle every 30 (thirty) days.   donepezil  10 MG tablet Commonly known as: ARICEPT  TAKE 1 TABLET AT BEDTIME   feeding supplement Liqd Take 237 mLs by mouth 3 (three) times daily between meals.   gabapentin  800 MG tablet Commonly known as: NEURONTIN  Take 800 mg by mouth 4 (four) times daily.   Ibuprofen 200 MG Caps Take 800 mg by mouth every 6 (six) hours as needed (for pain- when not taking Tylenol ).   levETIRAcetam  1000 MG tablet Commonly known as: Keppra  Take 1 tablet (1,000 mg total) by mouth 2 (two) times daily.   memantine  10 MG tablet Commonly known as: NAMENDA  Take 10 mg by mouth in the morning and at bedtime.   multivitamin with minerals Tabs tablet Take 1 tablet by mouth daily.   QUEtiapine 50 MG tablet Commonly known as: SEROQUEL Take 50 mg by mouth 3 (three) times daily.   QUEtiapine 25 MG tablet Commonly known as: SEROQUEL Take 25 mg by mouth 3 (three) times daily.   Rexulti  3 MG Tabs Generic drug: Brexpiprazole  Take 3 mg by mouth in the morning.   rosuvastatin  20 MG  tablet Commonly known as: CRESTOR  TAKE 1 TABLET ONE TIME DAILY What changed:  how much to take how to take this when to take this additional instructions   Thiamine  Mononitrate 250 MG Tabs Take 500 mg by mouth daily.   tiZANidine  4 MG tablet Commonly known as: ZANAFLEX  Take 1 tablet (4 mg total) by mouth 3 (three) times daily. What changed:  when to take this reasons to take this        Follow-up Information     [name] [name], MD. Schedule an appointment as soon as possible for a visit in 1 week(s).   Specialty: Internal Medicine Contact information: [address] [name] [address] [phone]         [facility] Follow up.   Why: HHPT will call to schedule start of care services.               No Known Allergies  Consultations:    Procedures/Studies: DG Chest 1 View Result Date: 11/28/2023 EXAM: 1 VIEW XRAY OF THE CHEST 11/28/2023 08:43:00  PM COMPARISON: Chest x-ray 07/28/2023. CLINICAL HISTORY: 89968 Cough 10031. Pt bib EMS from home after wife reports decline in general health. Pt with advanced alzheimer's and per wife, pt stopped eating and drinking and taking his medications 4 days ago. Wife also reported to EMS that pt has had several falls over the last 2 ; weeks as well. EDP at bedside. FINDINGS: LINES, TUBES AND DEVICES: Right chest wall 2 lead dialysis catheter. LUNGS AND PLEURA: Chronic coarsening interstitial markings. No overt pulmonary edema. Question developing patchy airspace opacity at the right lower lung zone. No pleural effusion. No pneumothorax. HEART AND MEDIASTINUM: Atherosclerotic plaque. No acute abnormality of the cardiac and mediastinal silhouettes. BONES AND SOFT TISSUES: No acute osseous abnormality. IMPRESSION: 1. Questionable developing patchy airspace opacity at the right lower lung zone. Recommend repeat chest x-ray PA and lateral view for further evaluation. Electronically signed by: Kate Plummer MD 11/28/2023  08:55 PM EDT RP Workstation: [id number]      Subjective:   Discharge Exam: Vitals:   11/30/23 0332 11/30/23 1254  BP: 118/77 121/73  Pulse: 88 69  Resp: 16 18  Temp: 98.1 F (36.7 C) (!) 97.5 F (36.4 C)  SpO2: 93% 95%    General: Pt is alert, awake, not in acute distress Cardiovascular: rate controlled, S1/S2 + Respiratory: bilateral decreased breath sounds at bases Abdominal: Soft, NT, ND, bowel sounds + Extremities: no edema, no cyanosis    The results of significant diagnostics from this hospitalization (including imaging, microbiology, ancillary and laboratory) are listed below for reference.     Microbiology: Recent Results (from the past 240 hours)  Culture, blood (routine x 2) Call MD if unable to obtain prior to antibiotics being given     Status: None (Preliminary result)   Collection Time: 11/29/23 12:46 AM   Specimen: BLOOD RIGHT ARM  Result Value Ref Range Status   Specimen Description BLOOD RIGHT ARM  Final   Special Requests AEROBIC BOTTLE ONLY Blood Culture adequate volume  Final   Culture   Final    NO GROWTH 1 DAY Performed at [facility], [address], [place], [place] [address]    Report Status PENDING  Incomplete  Culture, blood (routine x 2) Call MD if unable to obtain prior to antibiotics being given     Status: None (Preliminary result)   Collection Time: 11/29/23 12:53 AM   Specimen: BLOOD LEFT FOREARM  Result Value Ref Range Status   Specimen Description BLOOD LEFT FOREARM  Final   Special Requests AEROBIC BOTTLE ONLY Blood Culture adequate volume  Final   Culture   Final    NO GROWTH 1 DAY Performed at [facility], [address], [place], [place] [address]    Report Status PENDING  Incomplete     Labs: BNP (last 3 results) No results for input(s): BNP in the last 8760 hours. Basic Metabolic Panel: Recent Labs  Lab 11/28/23 2112 11/29/23 0602  NA 141 143  K 3.7 3.4*  CL 101 105  CO2 28 28  GLUCOSE 100* 87  BUN 18 15   CREATININE 0.94 0.79  CALCIUM  10.0 8.9  MG 2.3  --   PHOS  --  2.9   Liver Function Tests: Recent Labs  Lab 11/28/23 2112 11/29/23 0602  AST 34 27  ALT 42 34  ALKPHOS 82 69  BILITOT 0.6 0.7  PROT 7.2 6.2*  ALBUMIN 4.4 3.9   No results for input(s): LIPASE, AMYLASE in the last 168 hours. No results for  input(s): AMMONIA in the last 168 hours. CBC: Recent Labs  Lab 11/28/23 2112 11/29/23 0602  WBC 4.8 4.6  HGB 13.8 12.9*  HCT 42.6 38.6*  MCV 88.6 88.1  PLT 147* 140*   Cardiac Enzymes: No results for input(s): CKTOTAL, CKMB, CKMBINDEX, TROPONINI in the last 168 hours. BNP: Invalid input(s): POCBNP CBG: No results for input(s): GLUCAP in the last 168 hours. D-Dimer No results for input(s): DDIMER in the last 72 hours. Hgb A1c No results for input(s): HGBA1C in the last 72 hours. Lipid Profile No results for input(s): CHOL, HDL, LDLCALC, TRIG, CHOLHDL, LDLDIRECT in the last 72 hours. Thyroid  function studies No results for input(s): TSH, T4TOTAL, T3FREE, THYROIDAB in the last 72 hours.  Invalid input(s): FREET3 Anemia work up No results for input(s): VITAMINB12, FOLATE, FERRITIN, TIBC, IRON, RETICCTPCT in the last 72 hours. Urinalysis    Component Value Date/Time   COLORURINE YELLOW 08/20/2023 1945   APPEARANCEUR CLEAR 08/20/2023 1945   APPEARANCEUR Clear 04/11/2021 1429   LABSPEC 1.012 08/20/2023 1945   PHURINE 5.0 08/20/2023 1945   GLUCOSEU NEGATIVE 08/20/2023 1945   HGBUR NEGATIVE 08/20/2023 1945   BILIRUBINUR NEGATIVE 08/20/2023 1945   BILIRUBINUR Negative 04/11/2021 1429   KETONESUR NEGATIVE 08/20/2023 1945   PROTEINUR NEGATIVE 08/20/2023 1945   NITRITE NEGATIVE 08/20/2023 1945   LEUKOCYTESUR NEGATIVE 08/20/2023 1945   Sepsis Labs Recent Labs  Lab 11/28/23 2112 11/29/23 0602  WBC 4.8 4.6   Microbiology Recent Results (from the past 240 hours)  Culture, blood (routine x 2) Call MD if  unable to obtain prior to antibiotics being given     Status: None (Preliminary result)   Collection Time: 11/29/23 12:46 AM   Specimen: BLOOD RIGHT ARM  Result Value Ref Range Status   Specimen Description BLOOD RIGHT ARM  Final   Special Requests AEROBIC BOTTLE ONLY Blood Culture adequate volume  Final   Culture   Final    NO GROWTH 1 DAY Performed at [facility], [address] [place], [place] [address]    Report Status PENDING  Incomplete  Culture, blood (routine x 2) Call MD if unable to obtain prior to antibiotics being given     Status: None (Preliminary result)   Collection Time: 11/29/23 12:53 AM   Specimen: BLOOD LEFT FOREARM  Result Value Ref Range Status   Specimen Description BLOOD LEFT FOREARM  Final   Special Requests AEROBIC BOTTLE ONLY Blood Culture adequate volume  Final   Culture   Final    NO GROWTH 1 DAY Performed at [facility], [address], [place], [place] [address]    Report Status PENDING  Incomplete     Time coordinating discharge: 35 minutes  SIGNED:   Derryl Duval, MD  [facility] 11/30/2023, 2:31 PM

## 2023-11-30 NOTE — Plan of Care (Signed)
  Problem: Acute Rehab PT Goals(only PT should resolve) Goal: Patient Will Perform Sitting Balance 11/30/2023 1426 by Latiana Tomei, Student-PT Outcome: Progressing Flowsheets (Taken 11/30/2023 1424) Patient will perform sitting balance:  with supervision  with modified independence 11/30/2023 1424 by Tiona Ruane, Student-PT Outcome: Progressing Goal: Patient Will Transfer Sit To/From Stand 11/30/2023 1426 by Amiliah Campisi, Student-PT Outcome: Progressing Flowsheets (Taken 11/30/2023 1424) Patient will transfer sit to/from stand:  with supervision  with modified independence 11/30/2023 1424 by Jamieon Lannen, Student-PT Outcome: Progressing Goal: Pt Will Transfer Bed To Chair/Chair To Bed 11/30/2023 1426 by Paizley Ramella, Student-PT Outcome: Progressing Flowsheets (Taken 11/30/2023 1424) Pt will Transfer Bed to Chair/Chair to Bed: with supervision 11/30/2023 1424 by Dandrea Widdowson, Student-PT Outcome: Progressing Goal: Pt Will Perform Standing Balance Or Pre-Gait 11/30/2023 1426 by Zale Marcotte, Student-PT Outcome: Progressing Flowsheets (Taken 11/30/2023 1424) Pt will perform standing balance or pre-gait: with Supervision 11/30/2023 1424 by Bertran Zeimet, Student-PT Outcome: Progressing   Problem: Acute Rehab PT Goals(only PT should resolve) Goal: Pt Will Ambulate Outcome: Progressing Flowsheets (Taken 11/30/2023 1424) Pt will Ambulate:  > 125 feet  with supervision  Armas Mcbee, SPT

## 2023-11-30 NOTE — Care Management Important Message (Signed)
 Important Message  Patient Details  Name: Jonathan Poole MRN: [id number] Date of Birth: Oct 31, 1955   Important Message Given:  N/A - LOS <3 / Initial given by admissions     [name] Ada 11/30/2023, 11:38 AM

## 2023-11-30 NOTE — Evaluation (Signed)
 Physical Therapy Evaluation Patient Details Name: DAVARIS YOUTSEY MRN: [id number] DOB: May 28, 1955 Today's Date: 11/30/2023  History of Present Illness  Nathanie Ottley Bessler is a 68 y.o. male with medical history significant of  hyperlipidemia, atrial fibrillation, dementia, seizures who presents to the emergency department from home via EMS due to not eating or drinking or taking his medications in the last 4 days.  At bedside, patient was able to tell me his name and was aware of being in the hospital.  He states that he came to the hospital to see his wife who was sick (patient has a history of dementia).  He did not know what year it is and did not know the name of the president of the [place] .  History was obtained from EDP and ED medical records.  Per report, wife reported that patient was choking on pills, so he stopped taking his medication, he has been talking less and has not been eating or drinking as mentioned above.  He was also reported to have about frequent falls within the last 1 to 2 weeks. Wife activated EMS and patient was sent to the ED for further evaluation and management.  At baseline, patient usually gets around the house without the use of any assistive device.    Clinical Impression  Pt. Presented with generalized weakness and mild cognitive impairment during treatment. Pt. Was able to tranfers from bed to chair with supervision/contact guarding. Pt was able to ambulate without an AD but still required contact guarding with gait belt. Nursing staff was notified on pt. Status. Patient will benefit from continued skilled physical therapy in hospital and recommended venue below to increase strength, balance, endurance for safe ADLs and gait.       If plan is discharge home, recommend the following: A little help with walking and/or transfers;Assistance with cooking/housework   Can travel by private vehicle        Equipment Recommendations None recommended by PT   Recommendations for Other Services       Functional Status Assessment Patient has had a recent decline in their functional status and demonstrates the ability to make significant improvements in function in a reasonable and predictable amount of time.     Precautions / Restrictions Precautions Precautions: Fall Recall of Precautions/Restrictions: Intact Restrictions Weight Bearing Restrictions Per Provider Order: No      Mobility  Bed Mobility Overal bed mobility: Needs Assistance Bed Mobility: Supine to Sit     Supine to sit: Supervision     General bed mobility comments: No physical assist needed.    Transfers Overall transfer level: Needs assistance   Transfers: Sit to/from Stand, Bed to chair/wheelchair/BSC Sit to Stand: Contact guard assist, Min assist   Step pivot transfers: Min assist, Contact guard assist       General transfer comment: Mildly unsteady in standing and needing a couple attempts at sit to stand. EOB to chair without RW    Ambulation/Gait Ambulation/Gait assistance: Modified independent (Device/Increase time), Supervision Gait Distance (Feet): 100 Feet Assistive device: None Gait Pattern/deviations: Trunk flexed, Decreased step length - right, Step-through pattern, Decreased step length - left, Decreased stance time - right, Decreased stride length, Narrow base of support       General Gait Details: pt. was able to ambulate without AD, and was able to maintain normal gait speed  Stairs            Wheelchair Mobility     Tilt Bed    Modified  Rankin (Stroke Patients Only)       Balance Overall balance assessment: Needs assistance Sitting-balance support: Feet supported, No upper extremity supported Sitting balance-Leahy Scale: Fair Sitting balance - Comments: fair to good seated at EOB   Standing balance support: No upper extremity supported, During functional activity Standing balance-Leahy Scale: Fair Standing balance  comment: without AD                             Pertinent Vitals/Pain Pain Assessment Breathing: normal Negative Vocalization: none Facial Expression: smiling or inexpressive Body Language: relaxed Consolability: no need to console PAINAD Score: 0    Home Living Family/patient expects to be discharged to:: Private residence Living Arrangements: Spouse/significant other Available Help at Discharge: Family;Available 24 hours/day Type of Home: House Home Access: Level entry       Home Layout: One level Home Equipment: None Additional Comments: Information per chart and some patient report. Pt is a poor historian.    Prior Function Prior Level of Function : Patient poor historian/Family not available             Mobility Comments: Pt reports independent ambulation. ADLs Comments: Pt reports independence with ADL's and assist for IADL's.     Extremity/Trunk Assessment   Upper Extremity Assessment Upper Extremity Assessment: Defer to OT evaluation    Lower Extremity Assessment Lower Extremity Assessment: Generalized weakness    Cervical / Trunk Assessment Cervical / Trunk Assessment: Kyphotic  Communication   Communication Communication: No apparent difficulties    Cognition Arousal: Alert Behavior During Therapy: WFL for tasks assessed/performed   PT - Cognitive impairments: History of cognitive impairments, Attention                       PT - Cognition Comments: pt. had diffculty understanding initial commands Following commands: Intact       Cueing Cueing Techniques: Verbal cues, Tactile cues, Visual cues     General Comments      Exercises     Assessment/Plan    PT Assessment Patient needs continued PT services  PT Problem List Decreased strength;Decreased cognition;Decreased range of motion;Decreased activity tolerance;Decreased balance;Decreased mobility       PT Treatment Interventions DME instruction;Gait  training;Stair training;Cognitive remediation;Functional mobility training;Patient/family education;Therapeutic activities;Therapeutic exercise;Balance training    PT Goals (Current goals can be found in the Care Plan section)  Acute Rehab PT Goals Patient Stated Goal: pt. wants to return home PT Goal Formulation: With patient Time For Goal Achievement: 12/08/23 Potential to Achieve Goals: Good    Frequency Min 3X/week     Co-evaluation PT/OT/SLP Co-Evaluation/Treatment: Yes Reason for Co-Treatment: To address functional/ADL transfers PT goals addressed during session: Mobility/safety with mobility;Strengthening/ROM OT goals addressed during session: ADL's and self-care       AM-PAC PT 6 Clicks Mobility  Outcome Measure Help needed turning from your back to your side while in a flat bed without using bedrails?: A Little Help needed moving from lying on your back to sitting on the side of a flat bed without using bedrails?: A Little Help needed moving to and from a bed to a chair (including a wheelchair)?: A Little Help needed standing up from a chair using your arms (e.g., wheelchair or bedside chair)?: A Little Help needed to walk in hospital room?: A Little Help needed climbing 3-5 steps with a railing? : A Lot 6 Click Score: 17    End of Session  Equipment Utilized During Treatment: Gait belt Activity Tolerance: Patient tolerated treatment well Patient left: in chair;with call bell/phone within reach Nurse Communication: Mobility status PT Visit Diagnosis: Unsteadiness on feet (R26.81);Repeated falls (R29.6);Muscle weakness (generalized) (M62.81)    Time: 8965-8942 PT Time Calculation (min) (ACUTE ONLY): 23 min   Charges:   PT Evaluation $PT Eval Moderate Complexity: 1 Mod PT Treatments $Therapeutic Activity: 23-37 mins PT General Charges $$ ACUTE PT VISIT: 1 Visit        Jaymari Cromie, SPT

## 2023-12-02 ENCOUNTER — Other Ambulatory Visit: Payer: Self-pay

## 2023-12-02 DIAGNOSIS — Z515 Encounter for palliative care: Secondary | ICD-10-CM | POA: Diagnosis not present

## 2023-12-02 DIAGNOSIS — G300 Alzheimer's disease with early onset: Secondary | ICD-10-CM | POA: Diagnosis not present

## 2023-12-02 DIAGNOSIS — F02B11 Dementia in other diseases classified elsewhere, moderate, with agitation: Secondary | ICD-10-CM | POA: Diagnosis not present

## 2023-12-02 NOTE — Patient Outreach (Signed)
 Complex Care Management   Visit Note  12/02/2023  Name:  MATTY VANROEKEL MRN: [id number] DOB: [birth year]  Situation: Referral received for Complex Care Management related to SDOH Barriers:  Respite I obtained verbal consent from wife Blane Worthington.  Visit completed with wife Veer Elamin  on the phone  Background:   Past Medical History:  Diagnosis Date   Atrial fibrillation (HCC)    Dementia (HCC)    Heart disease    Hyperchloremia    Seizures (HCC)     Assessment:  Patients wife [name] reports she declined [facility] because she is already receiving services with [facility]. The [facility] Nurse visited today and agreed to arrange for the Hospice Nurse to visit today. Patient will work with [facility] regarding Respite. Mrs. Grimme did receive a return call from [facility] and has an appointment on 12/09/23 for assessment for the $500 assistance. Goal completed.  SDOH Interventions    Flowsheet Row Patient Outreach Telephone from 11/18/2023 in [facility] Patient Outreach Telephone from 11/02/2023 in [facility] Patient Outreach from 05/21/2023 in [facility] DEPART[facility] Care Coordination from 04/10/2023 in [facility] Community Care Coordination Care Coordination from 01/06/2023 in [facility] Community Care Coordination Care Coordination from 12/29/2022 in [facility] Care Coordination  SDOH Interventions        Food Insecurity Interventions -- Intervention Not Indicated -- Intervention Not Indicated Intervention Not Indicated Intervention Not Indicated  Housing Interventions -- Intervention Not Indicated -- Intervention Not Indicated -- Intervention Not Indicated  Transportation Interventions -- Intervention Not Indicated Intervention Not Indicated Intervention Not Indicated, Programmer, applications Provided, Patient Resources (Friends/Family) Intervention Not Indicated  Intervention Not Indicated, Patient Resources (Friends/Family)  Utilities Interventions -- Intervention Not Indicated -- Intervention Not Indicated Intervention Not Indicated Intervention Not Indicated  Alcohol  Usage Interventions -- -- -- Intervention Not Indicated (Score <7) -- Intervention Not Indicated (Score <7)  Depression Interventions/Treatment  -- Medication, Currently on Treatment Medication, Currently on Treatment -- -- --  Financial Strain Interventions Intervention Not Indicated -- Intervention Not Indicated Intervention Not Indicated -- Intervention Not Indicated  Physical Activity Interventions -- -- -- Patient Declined -- Patient Declined  Stress Interventions -- -- -- Intervention Not Indicated -- Intervention Not Indicated  Social Connections Interventions -- -- -- Intervention Not Indicated -- Intervention Not Indicated  Health Literacy Interventions -- Other (Comment)  [dementia wife assists with assessment] -- Intervention Not Indicated Other (Comment)  [dementia] Intervention Not Indicated      Recommendation:   None  Follow Up Plan:   Patient has met all care management goals. Care Management case will be closed. Patient has been provided contact information should new needs arise.   Tillman Gardener, BSW [place]  [facility], [facility] Social Worker Direct Dial: [phone]  Fax: [phone] Website: [email or web address]

## 2023-12-02 NOTE — Patient Instructions (Signed)
 Visit Information  Thank you for taking time to visit with me today. Please don't hesitate to contact me if I can be of assistance to you before our next scheduled appointment.   Please call the care guide team at [phone] if you need to cancel, schedule, or reschedule an appointment.   Please call 911 if you are experiencing a Mental Health or Behavioral Health Crisis or need someone to talk to.  Jonathan Poole, BSW [place]  [facility], [facility] Social Worker Direct Dial: [phone]  Fax: [phone] Website: [email or web address]

## 2023-12-04 LAB — CULTURE, BLOOD (ROUTINE X 2)
Culture: NO GROWTH
Culture: NO GROWTH
Special Requests: ADEQUATE
Special Requests: ADEQUATE

## 2023-12-13 ENCOUNTER — Encounter (HOSPITAL_COMMUNITY): Payer: Self-pay | Admitting: Emergency Medicine

## 2023-12-13 ENCOUNTER — Emergency Department (HOSPITAL_COMMUNITY)

## 2023-12-13 ENCOUNTER — Inpatient Hospital Stay (HOSPITAL_COMMUNITY)
Admission: EM | Admit: 2023-12-13 | Discharge: 2023-12-17 | DRG: 564 | Disposition: A | Discharge status: Skilled Nursing Facility | Attending: Internal Medicine | Admitting: Internal Medicine

## 2023-12-13 ENCOUNTER — Other Ambulatory Visit: Payer: Self-pay

## 2023-12-13 DIAGNOSIS — I482 Chronic atrial fibrillation, unspecified: Secondary | ICD-10-CM

## 2023-12-13 DIAGNOSIS — Z681 Body mass index (BMI) 19 or less, adult: Secondary | ICD-10-CM | POA: Diagnosis not present

## 2023-12-13 DIAGNOSIS — E872 Acidosis, unspecified: Secondary | ICD-10-CM | POA: Diagnosis present

## 2023-12-13 DIAGNOSIS — M6282 Rhabdomyolysis: Principal | ICD-10-CM

## 2023-12-13 DIAGNOSIS — G40209 Localization-related (focal) (partial) symptomatic epilepsy and epileptic syndromes with complex partial seizures, not intractable, without status epilepticus: Secondary | ICD-10-CM | POA: Diagnosis present

## 2023-12-13 DIAGNOSIS — E8729 Other acidosis: Secondary | ICD-10-CM

## 2023-12-13 DIAGNOSIS — R338 Other retention of urine: Secondary | ICD-10-CM | POA: Diagnosis not present

## 2023-12-13 DIAGNOSIS — R9431 Abnormal electrocardiogram [ECG] [EKG]: Secondary | ICD-10-CM

## 2023-12-13 DIAGNOSIS — D649 Anemia, unspecified: Secondary | ICD-10-CM | POA: Diagnosis present

## 2023-12-13 DIAGNOSIS — E870 Hyperosmolality and hypernatremia: Secondary | ICD-10-CM | POA: Diagnosis present

## 2023-12-13 DIAGNOSIS — N179 Acute kidney failure, unspecified: Secondary | ICD-10-CM | POA: Diagnosis present

## 2023-12-13 DIAGNOSIS — Z66 Do not resuscitate: Secondary | ICD-10-CM | POA: Diagnosis present

## 2023-12-13 DIAGNOSIS — S0181XA Laceration without foreign body of other part of head, initial encounter: Secondary | ICD-10-CM | POA: Diagnosis not present

## 2023-12-13 DIAGNOSIS — S0990XA Unspecified injury of head, initial encounter: Secondary | ICD-10-CM | POA: Diagnosis not present

## 2023-12-13 DIAGNOSIS — E860 Dehydration: Secondary | ICD-10-CM

## 2023-12-13 DIAGNOSIS — Z87891 Personal history of nicotine dependence: Secondary | ICD-10-CM | POA: Diagnosis not present

## 2023-12-13 DIAGNOSIS — E785 Hyperlipidemia, unspecified: Secondary | ICD-10-CM | POA: Diagnosis present

## 2023-12-13 DIAGNOSIS — W06XXXA Fall from bed, initial encounter: Secondary | ICD-10-CM | POA: Diagnosis present

## 2023-12-13 DIAGNOSIS — G309 Alzheimer's disease, unspecified: Secondary | ICD-10-CM | POA: Diagnosis present

## 2023-12-13 DIAGNOSIS — Y92239 Unspecified place in hospital as the place of occurrence of the external cause: Secondary | ICD-10-CM | POA: Diagnosis not present

## 2023-12-13 DIAGNOSIS — Z8042 Family history of malignant neoplasm of prostate: Secondary | ICD-10-CM

## 2023-12-13 DIAGNOSIS — R339 Retention of urine, unspecified: Secondary | ICD-10-CM | POA: Diagnosis present

## 2023-12-13 DIAGNOSIS — Z950 Presence of cardiac pacemaker: Secondary | ICD-10-CM | POA: Diagnosis not present

## 2023-12-13 DIAGNOSIS — F0390 Unspecified dementia without behavioral disturbance: Secondary | ICD-10-CM | POA: Diagnosis not present

## 2023-12-13 DIAGNOSIS — Y92009 Unspecified place in unspecified non-institutional (private) residence as the place of occurrence of the external cause: Secondary | ICD-10-CM | POA: Diagnosis not present

## 2023-12-13 DIAGNOSIS — I2489 Other forms of acute ischemic heart disease: Secondary | ICD-10-CM | POA: Diagnosis present

## 2023-12-13 DIAGNOSIS — R000 Tachycardia, unspecified: Secondary | ICD-10-CM | POA: Diagnosis not present

## 2023-12-13 DIAGNOSIS — T796XXD Traumatic ischemia of muscle, subsequent encounter: Secondary | ICD-10-CM | POA: Diagnosis not present

## 2023-12-13 DIAGNOSIS — S01111A Laceration without foreign body of right eyelid and periocular area, initial encounter: Secondary | ICD-10-CM | POA: Diagnosis present

## 2023-12-13 DIAGNOSIS — F0280 Dementia in other diseases classified elsewhere without behavioral disturbance: Secondary | ICD-10-CM | POA: Diagnosis not present

## 2023-12-13 DIAGNOSIS — Z8249 Family history of ischemic heart disease and other diseases of the circulatory system: Secondary | ICD-10-CM

## 2023-12-13 DIAGNOSIS — Z79899 Other long term (current) drug therapy: Secondary | ICD-10-CM

## 2023-12-13 DIAGNOSIS — T796XXA Traumatic ischemia of muscle, initial encounter: Secondary | ICD-10-CM | POA: Diagnosis present

## 2023-12-13 DIAGNOSIS — R7989 Other specified abnormal findings of blood chemistry: Secondary | ICD-10-CM

## 2023-12-13 DIAGNOSIS — S0083XA Contusion of other part of head, initial encounter: Secondary | ICD-10-CM

## 2023-12-13 DIAGNOSIS — F02811 Dementia in other diseases classified elsewhere, unspecified severity, with agitation: Secondary | ICD-10-CM | POA: Diagnosis present

## 2023-12-13 DIAGNOSIS — R296 Repeated falls: Secondary | ICD-10-CM | POA: Diagnosis present

## 2023-12-13 DIAGNOSIS — R410 Disorientation, unspecified: Secondary | ICD-10-CM | POA: Diagnosis not present

## 2023-12-13 DIAGNOSIS — E43 Unspecified severe protein-calorie malnutrition: Secondary | ICD-10-CM | POA: Diagnosis present

## 2023-12-13 DIAGNOSIS — E878 Other disorders of electrolyte and fluid balance, not elsewhere classified: Secondary | ICD-10-CM | POA: Diagnosis present

## 2023-12-13 DIAGNOSIS — Z7901 Long term (current) use of anticoagulants: Secondary | ICD-10-CM

## 2023-12-13 DIAGNOSIS — Z9181 History of falling: Secondary | ICD-10-CM

## 2023-12-13 DIAGNOSIS — G40909 Epilepsy, unspecified, not intractable, without status epilepticus: Secondary | ICD-10-CM | POA: Diagnosis not present

## 2023-12-13 DIAGNOSIS — W19XXXA Unspecified fall, initial encounter: Secondary | ICD-10-CM | POA: Diagnosis not present

## 2023-12-13 LAB — TROPONIN T, HIGH SENSITIVITY
Troponin T High Sensitivity: 27 ng/L — ABNORMAL HIGH (ref 0–19)
Troponin T High Sensitivity: 28 ng/L — ABNORMAL HIGH (ref 0–19)

## 2023-12-13 LAB — COMPREHENSIVE METABOLIC PANEL WITH GFR
ALT: 25 U/L (ref 0–44)
AST: 86 U/L — ABNORMAL HIGH (ref 15–41)
Albumin: 4.2 g/dL (ref 3.5–5.0)
Alkaline Phosphatase: 66 U/L (ref 38–126)
Anion gap: 23 — ABNORMAL HIGH (ref 5–15)
BUN: 32 mg/dL — ABNORMAL HIGH (ref 8–23)
CO2: 25 mmol/L (ref 22–32)
Calcium: 9.6 mg/dL (ref 8.9–10.3)
Chloride: 99 mmol/L (ref 98–111)
Creatinine, Ser: 1.86 mg/dL — ABNORMAL HIGH (ref 0.61–1.24)
GFR, Estimated: 39 mL/min — ABNORMAL LOW (ref >60)
Glucose, Bld: 122 mg/dL — ABNORMAL HIGH (ref 70–99)
Potassium: 4.2 mmol/L (ref 3.5–5.1)
Sodium: 146 mmol/L — ABNORMAL HIGH (ref 135–145)
Total Bilirubin: 0.7 mg/dL (ref 0.0–1.2)
Total Protein: 6.7 g/dL (ref 6.5–8.1)

## 2023-12-13 LAB — URINALYSIS, ROUTINE W REFLEX MICROSCOPIC
Bacteria, UA: NONE SEEN
Bilirubin Urine: NEGATIVE
Glucose, UA: NEGATIVE mg/dL
Ketones, ur: 20 mg/dL — AB
Leukocytes,Ua: NEGATIVE
Nitrite: NEGATIVE
Protein, ur: 100 mg/dL — AB
Specific Gravity, Urine: 1.021 (ref 1.005–1.030)
WBC, UA: >50 WBC/hpf (ref 0–5)
pH: 5 (ref 5.0–8.0)

## 2023-12-13 LAB — BLOOD GAS, VENOUS
Acid-Base Excess: 0.6 mmol/L (ref 0.0–2.0)
Bicarbonate: 26.6 mmol/L (ref 20.0–28.0)
Drawn by: 74040
O2 Saturation: 45.7 %
Patient temperature: 37.1
pCO2, Ven: 47 mmHg (ref 44–60)
pH, Ven: 7.36 (ref 7.25–7.43)
pO2, Ven: <31 mmHg — CL (ref 32–45)

## 2023-12-13 LAB — CBC WITH DIFFERENTIAL/PLATELET
Abs Immature Granulocytes: 0.02 K/uL (ref 0.00–0.07)
Basophils Absolute: 0 K/uL (ref 0.0–0.1)
Basophils Relative: 0 %
Eosinophils Absolute: 0 K/uL (ref 0.0–0.5)
Eosinophils Relative: 0 %
HCT: 36.4 % — ABNORMAL LOW (ref 39.0–52.0)
Hemoglobin: 12 g/dL — ABNORMAL LOW (ref 13.0–17.0)
Immature Granulocytes: 0 %
Lymphocytes Relative: 4 %
Lymphs Abs: 0.3 K/uL — ABNORMAL LOW (ref 0.7–4.0)
MCH: 29.1 pg (ref 26.0–34.0)
MCHC: 33 g/dL (ref 30.0–36.0)
MCV: 88.1 fL (ref 80.0–100.0)
Monocytes Absolute: 0.5 K/uL (ref 0.1–1.0)
Monocytes Relative: 7 %
Neutro Abs: 5.8 K/uL (ref 1.7–7.7)
Neutrophils Relative %: 89 %
Platelets: 160 K/uL (ref 150–400)
RBC: 4.13 MIL/uL — ABNORMAL LOW (ref 4.22–5.81)
RDW: 13.1 % (ref 11.5–15.5)
WBC: 6.7 K/uL (ref 4.0–10.5)
nRBC: 0 % (ref 0.0–0.2)

## 2023-12-13 LAB — CK: Total CK: 3632 U/L — ABNORMAL HIGH (ref 49–397)

## 2023-12-13 LAB — ETHANOL: Alcohol, Ethyl (B): <15 mg/dL (ref <15)

## 2023-12-13 LAB — LACTIC ACID, PLASMA
Lactic Acid, Venous: 2.9 mmol/L (ref 0.5–1.9)
Lactic Acid, Venous: 2.1 mmol/L (ref 0.5–1.9)

## 2023-12-13 LAB — AMMONIA: Ammonia: <13 umol/L (ref 9–35)

## 2023-12-13 LAB — MAGNESIUM: Magnesium: 2 mg/dL (ref 1.7–2.4)

## 2023-12-13 MED ORDER — TETANUS-DIPHTH-ACELL PERTUSSIS 5-2-15.5 LF-MCG/0.5 IM SUSP
0.5000 mL | Freq: Once | INTRAMUSCULAR | Status: AC
  Administered 2023-12-13: 0.5 mL via INTRAMUSCULAR
  Filled 2023-12-13: qty 0.5

## 2023-12-13 MED ORDER — LACTATED RINGERS IV BOLUS
500.0000 mL | Freq: Once | INTRAVENOUS | Status: AC
  Administered 2023-12-13: 500 mL via INTRAVENOUS

## 2023-12-13 MED ORDER — LACTATED RINGERS IV BOLUS
1000.0000 mL | Freq: Once | INTRAVENOUS | Status: AC
  Administered 2023-12-13: 1000 mL via INTRAVENOUS

## 2023-12-13 MED ORDER — LACTATED RINGERS IV SOLN: INTRAVENOUS | Status: AC

## 2023-12-13 MED ORDER — LACTATED RINGERS IV SOLN: INTRAVENOUS | Status: DC

## 2023-12-13 NOTE — ED Triage Notes (Signed)
 Pt found face down on the floor. Pt possibly could have been in floor about 8 hours. Pt has dementia.

## 2023-12-13 NOTE — ED Provider Notes (Signed)
 [facility] MEDICAL SURGICAL UNIT Provider Note   CSN: [id number] Arrival date & time: 12/13/23  1741     Patient presents with: Jonathan Poole is a 68 y.o. male.   Patient is a 68 year old male who presents to the emergency department by EMS after he was found lying in his bathroom floor for an unknown amount of time.  EMS notes that the patient does have a bad living situation is unsure if he is being cared for at home.  Patient does have a history of dementia and is unable to provide his own history.  Patient does have noted abrasions to his forehead but currently denies any other secondary sites of pain.   Fall       Prior to Admission medications   Medication Sig Start Date End Date Taking? Authorizing Provider  acetaminophen  (TYLENOL ) 500 MG tablet Take 500-1,000 mg by mouth every 6 (six) hours as needed (when not taking Ibuprofen).    [provider]  apixaban  (ELIQUIS ) 5 MG TABS tablet Take 1 tablet (5 mg total) by mouth 2 (two) times daily. NEEDS CARDIOLOGY APPT, CALL OFFICE [phone].  THANK YOU 08/28/23   Waddell Danelle ORN, MD  cefdinir (OMNICEF) 300 MG capsule Take 1 capsule (300 mg total) by mouth 2 (two) times daily. 11/30/23   Mcarthur Pick, MD  cyanocobalamin  (VITAMIN B12) 1000 MCG/ML injection Inject 1,000 mcg into the muscle every 30 (thirty) days. 03/28/23   [provider]  donepezil  (ARICEPT ) 10 MG tablet TAKE 1 TABLET AT BEDTIME 06/16/22   Tobie Suzzane POUR, MD  feeding supplement (ENSURE PLUS HIGH PROTEIN) LIQD Take 237 mLs by mouth 3 (three) times daily between meals. 08/03/23   Bryn Bernardino NOVAK, MD  gabapentin  (NEURONTIN ) 800 MG tablet Take 800 mg by mouth 4 (four) times daily. 10/27/23   [provider]  Ibuprofen 200 MG CAPS Take 800 mg by mouth every 6 (six) hours as needed (for pain- when not taking Tylenol ).    [provider]  levETIRAcetam  (KEPPRA ) 1000 MG tablet Take 1 tablet (1,000 mg total) by mouth 2 (two)  times daily. Patient not taking: Reported on 11/30/2023 06/05/23   Bryn Bernardino NOVAK, MD  LORazepam  (ATIVAN ) 2 MG/ML concentrated solution Take 2 mg by mouth every 3 (three) hours as needed for anxiety, seizure, sedation or sleep. 12/12/23   [provider]  memantine  (NAMENDA ) 10 MG tablet Take 10 mg by mouth in the morning and at bedtime. 05/09/23   [provider]  Morphine Sulfate (MORPHINE CONCENTRATE) 10 mg / 0.5 ml concentrated solution Take 0.5 mLs by mouth every 3 (three) hours as needed for severe pain (pain score 7-10). 12/08/23   [provider]  Multiple Vitamin (MULTIVITAMIN WITH MINERALS) TABS tablet Take 1 tablet by mouth daily. 08/04/23   Bryn Bernardino NOVAK, MD  QUEtiapine (SEROQUEL) 25 MG tablet Take 25 mg by mouth 3 (three) times daily. 10/27/23   [provider]  QUEtiapine (SEROQUEL) 50 MG tablet Take 50 mg by mouth 3 (three) times daily.    [provider]  REXULTI  3 MG TABS Take 3 mg by mouth in the morning.    [provider]  rosuvastatin  (CRESTOR ) 20 MG tablet TAKE 1 TABLET ONE TIME DAILY Patient taking differently: Take 20 mg by mouth in the morning. 02/26/22   Tobie Suzzane POUR, MD  thiamine  250 MG TABS Take 500 mg by mouth daily. 08/04/23   Bryn Bernardino NOVAK, MD  tiZANidine  (  ZANAFLEX ) 4 MG tablet Take 1 tablet (4 mg total) by mouth 3 (three) times daily. Patient taking differently: Take 4 mg by mouth every 6 (six) hours as needed for muscle spasms. 07/31/21   Bacchus, Gloria Z, FNP    Allergies: Patient has no known allergies.    Review of Systems  Unable to perform ROS: Dementia    Updated Vital Signs BP (!) 111/59   Pulse 62   Temp 98.4 F (36.9 C) (Oral)   Resp 17   Ht 6' (1.829 m)   Wt 61 kg   SpO2 99%   BMI 18.24 kg/m   Physical Exam Vitals and nursing note reviewed.  Constitutional:      General: He is not in acute distress.    Appearance: Normal appearance. He is not ill-appearing.  HENT:     Head:  Normocephalic and atraumatic.     Nose: Nose normal.     Mouth/Throat:     Mouth: Mucous membranes are moist.  Eyes:     Extraocular Movements: Extraocular movements intact.     Conjunctiva/sclera: Conjunctivae normal.     Pupils: Pupils are equal, round, and reactive to light.  Cardiovascular:     Rate and Rhythm: Normal rate and regular rhythm.     Pulses: Normal pulses.     Heart sounds: Normal heart sounds. No murmur heard.    No gallop.  Pulmonary:     Effort: Pulmonary effort is normal. No respiratory distress.     Breath sounds: Normal breath sounds. No stridor. No wheezing, rhonchi or rales.  Abdominal:     General: Abdomen is flat. Bowel sounds are normal. There is no distension.     Palpations: Abdomen is soft.     Tenderness: There is no abdominal tenderness. There is no guarding.  Musculoskeletal:        General: No swelling, tenderness, deformity or signs of injury. Normal range of motion.     Cervical back: Normal range of motion and neck supple. No rigidity or tenderness.  Skin:    General: Skin is warm and dry.     Findings: No rash.     Comments: Abrasions noted over the right side of the forehead  Neurological:     General: No focal deficit present.     Mental Status: He is alert. Mental status is at baseline. He is disoriented.  Psychiatric:        Mood and Affect: Mood normal.        Behavior: Behavior normal.        Thought Content: Thought content normal.        Judgment: Judgment normal.     (all labs ordered are listed, but only abnormal results are displayed) Labs Reviewed  COMPREHENSIVE METABOLIC PANEL WITH GFR - Abnormal; Notable for the following components:      Result Value   Sodium 146 (*)    Glucose, Bld 122 (*)    BUN 32 (*)    Creatinine, Ser 1.86 (*)    AST 86 (*)    GFR, Estimated 39 (*)    Anion gap 23 (*)    All other components within normal limits  LACTIC ACID, PLASMA - Abnormal; Notable for the following components:   Lactic  Acid, Venous 2.9 (*)    All other components within normal limits  LACTIC ACID, PLASMA - Abnormal; Notable for the following components:   Lactic Acid, Venous 2.1 (*)    All other components within normal  limits  CBC WITH DIFFERENTIAL/PLATELET - Abnormal; Notable for the following components:   RBC 4.13 (*)    Hemoglobin 12.0 (*)    HCT 36.4 (*)    Lymphs Abs 0.3 (*)    All other components within normal limits  URINALYSIS, ROUTINE W REFLEX MICROSCOPIC - Abnormal; Notable for the following components:   Color, Urine AMBER (*)    APPearance CLOUDY (*)    Hgb urine dipstick LARGE (*)    Ketones, ur 20 (*)    Protein, ur 100 (*)    All other components within normal limits  BLOOD GAS, VENOUS - Abnormal; Notable for the following components:   pO2, Ven <31 (*)    All other components within normal limits  CK - Abnormal; Notable for the following components:   Total CK 3,632 (*)    All other components within normal limits  TROPONIN T, HIGH SENSITIVITY - Abnormal; Notable for the following components:   Troponin T High Sensitivity 27 (*)    All other components within normal limits  TROPONIN T, HIGH SENSITIVITY - Abnormal; Notable for the following components:   Troponin T High Sensitivity 28 (*)    All other components within normal limits  URINE CULTURE  ETHANOL  AMMONIA  MAGNESIUM    EKG: None  Radiology: CT Cervical Spine Wo Contrast Result Date: 12/13/2023 EXAM: CT CERVICAL SPINE WITHOUT CONTRAST 12/13/2023 06:43:24 PM TECHNIQUE: CT of the cervical spine was performed without the administration of intravenous contrast. Multiplanar reformatted images are provided for review. Automated exposure control, iterative reconstruction, and/or weight based adjustment of the mA/kV was utilized to reduce the radiation dose to as low as reasonably achievable. COMPARISON: CT cervical spine 06/03/2023. CLINICAL HISTORY: Ataxia, cervical trauma. Found down on floor. FINDINGS: CERVICAL SPINE:  BONES AND ALIGNMENT: Cervical spine straightening. No listhesis. No acute fracture or suspicious lesion. DEGENERATIVE CHANGES: Moderate disc space narrowing with degenerative endplate spurring at C4-C5 and C5-C6. Up to moderate multilevel facet arthrosis. No evidence of high grade spinal canal stenosis. Moderate neural foraminal stenosis on the left at C4-C5 and on the right at C5-C6. SOFT TISSUES: No prevertebral soft tissue swelling. Asymmetrically prominent calcified plaque about the left carotid bifurcation. IMPRESSION: 1. No acute cervical spine fracture or traumatic malalignment. Electronically signed by: Dasie Hamburg MD 12/13/2023 06:58 PM EST RP Workstation: [id number]   CT Head Wo Contrast Result Date: 12/13/2023 EXAM: CT HEAD WITHOUT CONTRAST 12/13/2023 06:43:24 PM TECHNIQUE: CT of the head was performed without the administration of intravenous contrast. Automated exposure control, iterative reconstruction, and/or weight based adjustment of the mA/kV was utilized to reduce the radiation dose to as low as reasonably achievable. COMPARISON: CT head 07/28/2023. CLINICAL HISTORY: Head trauma, minor (Age >= 65y). Found down on floor. History of dementia. FINDINGS: BRAIN AND VENTRICLES: There is no evidence of an acute infarct, intracranial hemorrhage, mass, midline shift, hydrocephalus, or extra-axial fluid collection. There is mild cerebral atrophy. Cerebral white matter hypodensities are similar to the prior CT and nonspecific but compatible with mild chronic small vessel ischemic disease. ORBITS: No acute abnormality. SINUSES: Minimal mucosal thickening in the paranasal sinuses. Small mucous retention cyst in the left maxillary sinus. Clear mastoid air cells. SOFT TISSUES AND SKULL: Frontal scalp soft tissue swelling. No skull fracture. IMPRESSION: 1. No acute intracranial abnormality. 2. Mild chronic small vessel ischemic disease. Electronically signed by: Dasie Hamburg MD 12/13/2023 06:53 PM EST RP  Workstation: [id number]   DG Chest Port 1 View Result Date: 12/13/2023 EXAM: 1  VIEW(S) XRAY OF THE CHEST 12/13/2023 06:00:02 PM COMPARISON: Comparison to study dated 11/28/2023. CLINICAL HISTORY: fall fall FINDINGS: LINES, TUBES AND DEVICES: Right-sided pacemaker is unchanged compared to the prior study. LUNGS AND PLEURA: No focal pulmonary opacity. No pulmonary edema. No pleural effusion. No pneumothorax. HEART AND MEDIASTINUM: Right-sided pacemaker is unchanged compared to the prior study. No acute abnormality of the cardiac and mediastinal silhouettes. BONES AND SOFT TISSUES: No acute osseous abnormality. IMPRESSION: 1. No acute cardiopulmonary process. Electronically signed by: Lynwood Seip MD 12/13/2023 06:14 PM EST RP Workstation: [id number]   DG Pelvis Portable Result Date: 12/13/2023 EXAM: 1 or 2 VIEW(S) XRAY OF THE PELVIS 12/13/2023 06:00:02 PM COMPARISON: None available. CLINICAL HISTORY: Fall. FINDINGS: BONES AND JOINTS: No acute fracture. No focal osseous lesion. No joint dislocation. SOFT TISSUES: The soft tissues are unremarkable. IMPRESSION: 1. No significant abnormality. Electronically signed by: Lynwood Seip MD 12/13/2023 06:13 PM EST RP Workstation: [id number]     Procedures   Medications Ordered in the ED  lactated ringers  infusion ( Intravenous New Bag/Given 12/13/23 2109)  lactated ringers  bolus 1,000 mL (0 mLs Intravenous Stopped 12/13/23 1856)  lactated ringers  bolus 500 mL (0 mLs Intravenous Stopped 12/13/23 2006)  Tdap (ADACEL) injection 0.5 mL (0.5 mLs Intramuscular Given 12/13/23 1957)  lactated ringers  bolus 500 mL (0 mLs Intravenous Stopped 12/13/23 2136)                                    Medical Decision Making Amount and/or Complexity of Data Reviewed Labs: ordered. Radiology: ordered.  Risk Prescription drug management. Decision regarding hospitalization.   This patient presents to the ED for concern of fall, altered mental status, this involves an extensive  number of treatment options, and is a complaint that carries with it a high risk of complications and morbidity.  The differential diagnosis includes dementia, urinary tract infection, sepsis, pneumonia, intracranial hemorrhage, CVA, TIA, rhabdomyolysis, acute kidney injury, electrolyte derangement   Co morbidities that complicate the patient evaluation  Dementia   Additional history obtained:  Additional history obtained from EMS External records from outside source obtained and reviewed including medical records   Lab Tests:  I Ordered, and personally interpreted labs.  The pertinent results include: No leukocytosis, anemia at baseline, elevated creatinine, normal liver function, unremarkable electrolytes, elevated CK, elevated lactic acid, urinalysis with large hemoglobin, stable serial troponins, negative ammonia, negative alcohol    Imaging Studies ordered:  I ordered imaging studies including CT scan head, CT cervical spine, chest x-ray, pelvis x-ray I independently visualized and interpreted imaging which showed no acute intracranial hemorrhage, no cervical spine fracture, no acute cardiopulmonary process, no acute fracture of the pelvis I agree with the radiologist interpretation   Cardiac Monitoring: / EKG:  The patient was maintained on a cardiac monitor.  I personally viewed and interpreted the cardiac monitored which showed an underlying rhythm of: Normal sinus rhythm, no ST/T wave changes, no ischemic changes, no STEMI   Consultations Obtained:  I requested consultation with the hospitalist,  and discussed lab and imaging findings as well as pertinent plan - they recommend: Admission   Problem List / ED Course / Critical interventions / Medication management  Patient is doing well at this time and does remain stable.  Patient will be admitted to the hospital service given his rhabdomyolysis, acute kidney injury, altered mental status.  Urine culture has been ordered  for further evaluation for possible  UTI.  Adult Protective Services have been contacted as well given patient's living situation and they will evaluate the patient.  He has no other obvious indication for traumatic injury on exam with no tenderness over chest wall and abdomen.  He has no tenderness over cervical, thoracic, lumbar spine.  He has no other long bone or joint pain or obvious deformities.  Have discussed patient case with Dr. Adefeso with the hospital service who has excepted for admission. I ordered medication including IV fluids for acute kidney injury and rhabdomyolysis Reevaluation of the patient after these medicines showed that the patient improved I have reviewed the patients home medicines and have made adjustments as needed   Social Determinants of Health:  None   Test / Admission - Considered:  Admission     Final diagnoses:  None    ED Discharge Orders     None          Daralene Lonni JONETTA DEVONNA 12/13/23 2212    Suzette Pac, MD 12/14/23 1648

## 2023-12-13 NOTE — ED Notes (Signed)
 [employer] is here doing report on pt's living conditions and they are contacting adult protective services.

## 2023-12-13 NOTE — ED Notes (Signed)
 Patient transported to CT

## 2023-12-13 NOTE — ED Notes (Signed)
 Date and time results received: 12/13/23 1915  Test: paO2 Critical Value: <31  Name of Provider Notified: Daralene Barefoot, PA-C

## 2023-12-13 NOTE — ED Notes (Signed)
 Spoke with [facility] of Social Services. They feel pt is unsafe to be discharged home and legal action might be taking place. They have case open would like a call back upon decision to discharge. Call back  # [phone]

## 2023-12-13 NOTE — H&P (Signed)
 History and Physical    Patient: Jonathan Poole FMW:[id number] DOB: [birth year] DOA: 12/13/2023 DOS: the patient was seen and examined on 12/13/2023 PCP: [name] [name], MD  Patient coming from: Home  Chief Complaint:  Chief Complaint  Patient presents with   Fall   HPI: Jonathan Poole is a 68 y.o. male with medical history significant of atrial fibrillation, hyperlipidemia, seizures, dementia who presents to the emergency department from home via EMS due to fall sustained at home.  At bedside, patient was unable to provide history possibly due to underlying dementia, history was obtained from ED PA and ED medical record.  Per report, EMS was activated and patient was found lying in his bathroom floor for an unknown period of time.  Living situation was brought per EMS report and he sustained abrasions to his forehead without any other complaints.  ED course In the emergency department, he was hemodynamically stable.  Workup in the ED showed normocytic anemia.  BMP was significant for sodium of 146, blood glucose 122, BUN 32, creatinine 1.86 (baseline creatinine at 0.7-0.9), total CK 3,632, troponin 27 > 28, lactic acid 2.9 > 2.1.,  Magnesium 2.0. CT head without contrast showed no acute rectal abnormality CT cervical spine without contrast showed no acute cervical spine fracture or traumatic malalignment Pelvic x-ray showed no significant normality and chest x-ray showed no acute cardiopulmonary process IV hydration was provided.  TRH was asked to admit patient.   Review of Systems: As mentioned in the history of present illness. All other systems reviewed and are negative. Past Medical History:  Diagnosis Date   Atrial fibrillation (HCC)    Dementia (HCC)    Heart disease    Hyperchloremia    Seizures (HCC)    Past Surgical History:  Procedure Laterality Date   PACEMAKER IMPLANT     PPM GENERATOR CHANGEOUT N/A 01/02/2020   Procedure: PPM GENERATOR CHANGEOUT;  Surgeon:  Waddell Danelle ORN, MD;  Location: [facility] INVASIVE CV LAB;  Service: Cardiovascular;  Laterality: N/A;   Social History:  reports that he quit smoking about 24 years ago. His smoking use included cigarettes. He started smoking about 39 years ago. He has a 15 pack-year smoking history. He has been exposed to tobacco smoke. He has never used smokeless tobacco. He reports that he does not currently use drugs after having used the following drugs: Marijuana. He reports that he does not drink alcohol .  No Known Allergies  Family History  Problem Relation Age of Onset   Heart disease Father    Prostate cancer Paternal Grandfather     Prior to Admission medications   Medication Sig Start Date End Date Taking? Authorizing Provider  acetaminophen  (TYLENOL ) 500 MG tablet Take 500-1,000 mg by mouth every 6 (six) hours as needed (when not taking Ibuprofen).    [provider]  apixaban  (ELIQUIS ) 5 MG TABS tablet Take 1 tablet (5 mg total) by mouth 2 (two) times daily. NEEDS CARDIOLOGY APPT, CALL OFFICE [phone].  THANK YOU 08/28/23   Waddell Danelle ORN, MD  cefdinir (OMNICEF) 300 MG capsule Take 1 capsule (300 mg total) by mouth 2 (two) times daily. 11/30/23   Mcarthur Pick, MD  cyanocobalamin  (VITAMIN B12) 1000 MCG/ML injection Inject 1,000 mcg into the muscle every 30 (thirty) days. 03/28/23   [provider]  donepezil  (ARICEPT ) 10 MG tablet TAKE 1 TABLET AT BEDTIME 06/16/22   Patel, Rutwik K, MD  feeding supplement (ENSURE PLUS HIGH PROTEIN) LIQD Take 237 mLs by  mouth 3 (three) times daily between meals. 08/03/23   Bryn Bernardino NOVAK, MD  gabapentin  (NEURONTIN ) 800 MG tablet Take 800 mg by mouth 4 (four) times daily. 10/27/23   [provider]  Ibuprofen 200 MG CAPS Take 800 mg by mouth every 6 (six) hours as needed (for pain- when not taking Tylenol ).    [provider]  levETIRAcetam  (KEPPRA ) 1000 MG tablet Take 1 tablet (1,000 mg total) by mouth 2 (two) times daily. Patient not  taking: Reported on 11/30/2023 06/05/23   Bryn Bernardino NOVAK, MD  LORazepam  (ATIVAN ) 2 MG/ML concentrated solution Take 2 mg by mouth every 3 (three) hours as needed for anxiety, seizure, sedation or sleep. 12/12/23   [provider]  memantine  (NAMENDA ) 10 MG tablet Take 10 mg by mouth in the morning and at bedtime. 05/09/23   [provider]  Morphine Sulfate (MORPHINE CONCENTRATE) 10 mg / 0.5 ml concentrated solution Take 0.5 mLs by mouth every 3 (three) hours as needed for severe pain (pain score 7-10). 12/08/23   [provider]  Multiple Vitamin (MULTIVITAMIN WITH MINERALS) TABS tablet Take 1 tablet by mouth daily. 08/04/23   Bryn Bernardino NOVAK, MD  QUEtiapine (SEROQUEL) 25 MG tablet Take 25 mg by mouth 3 (three) times daily. 10/27/23   [provider]  QUEtiapine (SEROQUEL) 50 MG tablet Take 50 mg by mouth 3 (three) times daily.    [provider]  REXULTI  3 MG TABS Take 3 mg by mouth in the morning.    [provider]  rosuvastatin  (CRESTOR ) 20 MG tablet TAKE 1 TABLET ONE TIME DAILY Patient taking differently: Take 20 mg by mouth in the morning. 02/26/22   Tobie Suzzane POUR, MD  thiamine  250 MG TABS Take 500 mg by mouth daily. 08/04/23   Bryn Bernardino NOVAK, MD  tiZANidine  (ZANAFLEX ) 4 MG tablet Take 1 tablet (4 mg total) by mouth 3 (three) times daily. Patient taking differently: Take 4 mg by mouth every 6 (six) hours as needed for muscle spasms. 07/31/21   Edman Meade PEDLAR, FNP    Physical Exam: Vitals:   12/13/23 2015 12/13/23 2030 12/13/23 2045 12/13/23 2100  BP: 94/63 96/62 95/62  93/62  Pulse: 75 75 71 74  Resp: 12 13 15 16   Temp:      TempSrc:      SpO2: 100% 98% 98% 99%  Weight:      Height:        General: Somnolent, but easily arousable and not in any acute distress.  HEENT: Noted abrasions and forward.  PERRL Sclerae anicteric.  Dry mucosal membranes. Neck: Neck supple without lymphadenopathy. No carotid bruits. No masses palpated.   Cardiovascular: Regular rate with normal S1-S2 sounds. No murmurs, rubs or gallops auscultated. No JVD.  Respiratory: Clear breath sounds.  No accessory muscle use. Abdomen: Soft, nontender, nondistended. Active bowel sounds. No masses or hepatosplenomegaly  Skin: No rashes, lesions, or ulcerations.  Dry, warm to touch. Musculoskeletal:  2+ dorsalis pedis and radial pulses.  No contractures  Psychiatric: This cannot be assessed at this time due to patient's current condition Neurologic: No focal neurological deficits.  Patient was moving all extremities  Data Reviewed: EKG personally reviewed showed normal sinus rhythm at rate of 86 bpm with QTc of 509 ms  Assessment and Plan: Rhabdomyolysis Total CK 3,632 Continue IV hydration Continue to monitor total CK  High anion gap metabolic acidosis possibly due to above and due to dehydration Anion gap 23, continue IV hydration  and monitor for anion gap closure  Lactic acidosis due to rhabdomyolysis and dehydration Dehydration Lactic acid 2.9 > 2.1; continue to trend lactic acid Continue IV hydration  Hypernatremia due to dehydration Na 146, continue IV hydration Continue to monitor follow-up BMP with morning labs  Elevated troponin possibly due to type II demand ischemia Troponin 27 > 28; patient denies chest pain Continue to trend troponin  Prolonged QT interval QTc 509 ms Avoid QT prolonging drugs Magnesium level was 2.0 Repeat EKG in the morning  Fall at home Patient has a history of recurrent falls Continue fall precaution Continue PT/OT eval and treat  Dementia Continue Aricept  and Namenda , multivitamins and nutritional supplements.  Continue delirium precaution   Chronic atrial fibrillation Continue Eliquis  Patient was not on any rate control medication per med rec   Seizure disorder Continue Keppra     Advance Care Planning: DNR  Consults: None  Family Communication: None at bedside  Severity of  Illness: The appropriate patient status for this patient is INPATIENT. Inpatient status is judged to be reasonable and necessary in order to provide the required intensity of service to ensure the patient's safety. The patient's presenting symptoms, physical exam findings, and initial radiographic and laboratory data in the context of their chronic comorbidities is felt to place them at high risk for further clinical deterioration. Furthermore, it is not anticipated that the patient will be medically stable for discharge from the hospital within 2 midnights of admission.   * I certify that at the point of admission it is my clinical judgment that the patient will require inpatient hospital care spanning beyond 2 midnights from the point of admission due to high intensity of service, high risk for further deterioration and high frequency of surveillance required.*  Author: Shekita Boyden, DO 12/13/2023 9:17 PM  For on call review [email or web address].

## 2023-12-14 ENCOUNTER — Inpatient Hospital Stay (HOSPITAL_COMMUNITY)

## 2023-12-14 DIAGNOSIS — W19XXXD Unspecified fall, subsequent encounter: Secondary | ICD-10-CM

## 2023-12-14 DIAGNOSIS — M6282 Rhabdomyolysis: Secondary | ICD-10-CM | POA: Diagnosis not present

## 2023-12-14 DIAGNOSIS — S01111A Laceration without foreign body of right eyelid and periocular area, initial encounter: Secondary | ICD-10-CM

## 2023-12-14 LAB — COMPREHENSIVE METABOLIC PANEL WITH GFR
ALT: 34 U/L (ref 0–44)
AST: 154 U/L — ABNORMAL HIGH (ref 15–41)
Albumin: 3.6 g/dL (ref 3.5–5.0)
Alkaline Phosphatase: 56 U/L (ref 38–126)
Anion gap: 17 — ABNORMAL HIGH (ref 5–15)
BUN: 31 mg/dL — ABNORMAL HIGH (ref 8–23)
CO2: 27 mmol/L (ref 22–32)
Calcium: 8.9 mg/dL (ref 8.9–10.3)
Chloride: 102 mmol/L (ref 98–111)
Creatinine, Ser: 1.29 mg/dL — ABNORMAL HIGH (ref 0.61–1.24)
GFR, Estimated: >60 mL/min (ref >60)
Glucose, Bld: 103 mg/dL — ABNORMAL HIGH (ref 70–99)
Potassium: 3.9 mmol/L (ref 3.5–5.1)
Sodium: 145 mmol/L (ref 135–145)
Total Bilirubin: 0.5 mg/dL (ref 0.0–1.2)
Total Protein: 5.6 g/dL — ABNORMAL LOW (ref 6.5–8.1)

## 2023-12-14 LAB — CBC
HCT: 31.7 % — ABNORMAL LOW (ref 39.0–52.0)
Hemoglobin: 10.6 g/dL — ABNORMAL LOW (ref 13.0–17.0)
MCH: 29.5 pg (ref 26.0–34.0)
MCHC: 33.4 g/dL (ref 30.0–36.0)
MCV: 88.3 fL (ref 80.0–100.0)
Platelets: 171 K/uL (ref 150–400)
RBC: 3.59 MIL/uL — ABNORMAL LOW (ref 4.22–5.81)
RDW: 13.2 % (ref 11.5–15.5)
WBC: 5.6 K/uL (ref 4.0–10.5)
nRBC: 0 % (ref 0.0–0.2)

## 2023-12-14 LAB — TROPONIN T, HIGH SENSITIVITY
Troponin T High Sensitivity: 31 ng/L — ABNORMAL HIGH (ref 0–19)
Troponin T High Sensitivity: 37 ng/L — ABNORMAL HIGH (ref 0–19)

## 2023-12-14 LAB — LACTIC ACID, PLASMA
Lactic Acid, Venous: 1.3 mmol/L (ref 0.5–1.9)
Lactic Acid, Venous: 1.7 mmol/L (ref 0.5–1.9)
Lactic Acid, Venous: 2.2 mmol/L (ref 0.5–1.9)
Lactic Acid, Venous: 5.3 mmol/L (ref 0.5–1.9)

## 2023-12-14 LAB — CK: Total CK: 6351 U/L — ABNORMAL HIGH (ref 49–397)

## 2023-12-14 LAB — PHOSPHORUS: Phosphorus: 3.5 mg/dL (ref 2.5–4.6)

## 2023-12-14 LAB — MAGNESIUM: Magnesium: 1.9 mg/dL (ref 1.7–2.4)

## 2023-12-14 MED ORDER — ACETAMINOPHEN 650 MG RE SUPP: 650.0000 mg | Freq: Four times a day (QID) | RECTAL | Status: DC | PRN

## 2023-12-14 MED ORDER — APIXABAN 5 MG PO TABS
5.0000 mg | ORAL_TABLET | Freq: Two times a day (BID) | ORAL | Status: DC
  Administered 2023-12-14 – 2023-12-17 (×8): 5 mg via ORAL
  Filled 2023-12-14 (×9): qty 1

## 2023-12-14 MED ORDER — PROCHLORPERAZINE EDISYLATE 10 MG/2ML IJ SOLN
10.0000 mg | Freq: Four times a day (QID) | INTRAMUSCULAR | Status: DC | PRN
  Administered 2023-12-15: 10 mg via INTRAVENOUS
  Filled 2023-12-14: qty 2

## 2023-12-14 MED ORDER — LACTATED RINGERS IV BOLUS
1000.0000 mL | Freq: Once | INTRAVENOUS | Status: AC
  Administered 2023-12-14: 1000 mL via INTRAVENOUS

## 2023-12-14 MED ORDER — QUETIAPINE FUMARATE 25 MG PO TABS
75.0000 mg | ORAL_TABLET | Freq: Three times a day (TID) | ORAL | Status: DC
  Administered 2023-12-14 – 2023-12-17 (×9): 75 mg via ORAL
  Filled 2023-12-14 (×10): qty 3

## 2023-12-14 MED ORDER — ADULT MULTIVITAMIN W/MINERALS CH
1.0000 | ORAL_TABLET | Freq: Every day | ORAL | Status: DC
  Administered 2023-12-14 – 2023-12-17 (×4): 1 via ORAL
  Filled 2023-12-14 (×4): qty 1

## 2023-12-14 MED ORDER — ACETAMINOPHEN 325 MG PO TABS
650.0000 mg | ORAL_TABLET | Freq: Four times a day (QID) | ORAL | Status: DC | PRN
  Administered 2023-12-15 – 2023-12-17 (×3): 650 mg via ORAL
  Filled 2023-12-14 (×3): qty 2

## 2023-12-14 MED ORDER — LORAZEPAM 2 MG/ML PO CONC: 2.0000 mg | ORAL | Status: DC | PRN

## 2023-12-14 MED ORDER — ENSURE PLUS HIGH PROTEIN PO LIQD
237.0000 mL | Freq: Three times a day (TID) | ORAL | Status: DC
  Administered 2023-12-14 – 2023-12-17 (×8): 237 mL via ORAL

## 2023-12-14 MED ORDER — DONEPEZIL HCL 5 MG PO TABS
10.0000 mg | ORAL_TABLET | Freq: Every day | ORAL | Status: DC
  Administered 2023-12-14 – 2023-12-17 (×4): 10 mg via ORAL
  Filled 2023-12-14 (×4): qty 2

## 2023-12-14 MED ORDER — GABAPENTIN 400 MG PO CAPS
800.0000 mg | ORAL_CAPSULE | Freq: Four times a day (QID) | ORAL | Status: DC
  Administered 2023-12-14 – 2023-12-17 (×15): 800 mg via ORAL
  Filled 2023-12-14 (×15): qty 2

## 2023-12-14 MED ORDER — MEMANTINE HCL 10 MG PO TABS
10.0000 mg | ORAL_TABLET | Freq: Two times a day (BID) | ORAL | Status: DC
  Administered 2023-12-14 – 2023-12-17 (×8): 10 mg via ORAL
  Filled 2023-12-14 (×8): qty 1

## 2023-12-14 MED ORDER — LORAZEPAM 1 MG PO TABS
2.0000 mg | ORAL_TABLET | ORAL | Status: DC | PRN
  Administered 2023-12-14 (×2): 2 mg via ORAL
  Filled 2023-12-14 (×2): qty 2

## 2023-12-14 MED ORDER — LEVETIRACETAM 500 MG PO TABS
1000.0000 mg | ORAL_TABLET | Freq: Two times a day (BID) | ORAL | Status: DC
  Administered 2023-12-14 – 2023-12-17 (×8): 1000 mg via ORAL
  Filled 2023-12-14 (×8): qty 2

## 2023-12-14 NOTE — Progress Notes (Addendum)
 DOB: 1955/09/05 Date: 12/14/2023   Must ID: [id number]   To Whom it May Concern:  Please be advised that the above named patient will require a short-term nursing home stay- anticipated 30 days or less rehabilitation and strengthening. The plan is for return home.  Please be advised that the above-named patient has a primary diagnosis of dementia which supersedes any psychiatric diagnosis.

## 2023-12-14 NOTE — Plan of Care (Signed)
  Problem: Acute Rehab PT Goals(only PT should resolve) Goal: Pt Will Go Supine/Side To Sit Outcome: Progressing Flowsheets (Taken 12/14/2023 1346) Pt will go Supine/Side to Sit:  with modified independence  Independently Goal: Pt Will Go Sit To Supine/Side Outcome: Progressing Flowsheets (Taken 12/14/2023 1346) Pt will go Sit to Supine/Side:  with modified independence  Independently Goal: Patient Will Perform Sitting Balance Outcome: Progressing Flowsheets (Taken 12/14/2023 1346) Patient will perform sitting balance:  with modified independence  Independently Goal: Patient Will Transfer Sit To/From Stand Outcome: Progressing Flowsheets (Taken 12/14/2023 1346) Patient will transfer sit to/from stand:  with modified independence  Independently Goal: Pt Will Transfer Bed To Chair/Chair To Bed Outcome: Progressing Flowsheets (Taken 12/14/2023 1346) Pt will Transfer Bed to Chair/Chair to Bed:  with modified independence  with supervision Goal: Pt Will Perform Standing Balance Or Pre-Gait Outcome: Progressing Flowsheets (Taken 12/14/2023 1346) Pt will perform standing balance or pre-gait: with Supervision Note: W/ RW Goal: Pt Will Ambulate Outcome: Progressing Flowsheets (Taken 12/14/2023 1346) Pt will Ambulate:  with supervision  50 feet  75 feet Note: W/ RW   Suheily Birks, SPT

## 2023-12-14 NOTE — ED Provider Notes (Signed)
 Called to floor by Dr. Manfred. Patient had fallen out of bed on the inpatient floor and sustained a laceration to R eyebrow and I was asked to come repair the wound. Further care per primary team.   .Laceration Repair  Date/Time: 12/14/2023 2:21 AM  Performed by: Roselyn Carlin NOVAK, MD Authorized by: Roselyn Carlin NOVAK, MD   Consent:    Consent obtained:  Emergent situation Anesthesia:    Anesthesia method:  Local infiltration   Local anesthetic:  Lidocaine  2% WITH epi Laceration details:    Location:  Face   Face location:  R eyebrow   Length (cm):  3 Pre-procedure details:    Preparation:  Patient was prepped and draped in usual sterile fashion Exploration:    Hemostasis achieved with:  Epinephrine Treatment:    Area cleansed with:  Saline   Amount of cleaning:  Standard Skin repair:    Repair method:  Sutures   Suture size:  5-0   Suture material:  Nylon   Suture technique:  Simple interrupted   Number of sutures:  5 Approximation:    Approximation:  Close Repair type:    Repair type:  Simple Post-procedure details:    Dressing:  Open (no dressing)   Procedure completion:  Tolerated well, no immediate complications     Roselyn Carlin NOVAK, MD 12/14/23 0222

## 2023-12-14 NOTE — Progress Notes (Addendum)
 Dr. Roselyn at the bedside placing sutures to laceration above the right eye. Sutures should be removed in one week.

## 2023-12-14 NOTE — Progress Notes (Addendum)
 Lab entered the room at 0127 to obtain labs. When she entered the room, she observed the patient in the floor at the foot of the bed at the left side towards the window. Patient was on his right side. Charge RN was in a patient room, his primary RN was notified as well as AC. He was noted to have a laceration above the right eye that required first aid. Area was cleansed with normal saline and gauze applied to the area. Dr. Manfred was made aware and that the patient was bleeding and possibly needed for sutures. Dr. Adefeso at the bedside at 0200. Fall mats were place on both sides of the bed, bed alarm is on, fall bracelet was already in place, and his wife was notified. Per wife, patient is hospice with [facility]. Wife was asked to provide DNR and hospice documentation.

## 2023-12-14 NOTE — NC FL2 (Signed)
 [place]  MEDICAID FL2 LEVEL OF CARE FORM     IDENTIFICATION  Patient Name: Jonathan Poole Birthdate: [birth year] Sex: male Admission Date (Current Location): 12/13/2023  [facility] and [place] Number:  [employer] and Address:  [facility],  [address] [place] [address]      Provider Number: [id number]  Attending Physician Name and Address:  Mcarthur Pick, MD  Relative Name and Phone Number:       Current Level of Care: Hospital Recommended Level of Care: Skilled Nursing Facility Prior Approval Number:    Date Approved/Denied:   PASRR Number: pending  Discharge Plan: SNF    Current Diagnoses: Patient Active Problem List   Diagnosis Date Noted   Rhabdomyolysis 12/13/2023   Failure to thrive in adult 11/29/2023   Seizure (HCC) 11/29/2023   Aspiration pneumonia (HCC) 11/28/2023   Protein-calorie malnutrition, severe 07/30/2023   Acute conjunctivitis of right eye 07/30/2023   Acute metabolic encephalopathy 07/29/2023   AKI (acute kidney injury) 07/29/2023   Lactic acidosis 07/29/2023   Other encephalopathy 07/29/2023   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   Dementia with behavioral disturbance (HCC) 06/19/2023   Memory loss 06/05/2023   Restlessness and agitation 06/05/2023   Encephalopathy 06/04/2023   Hyperbilirubinemia 06/04/2023   Thrombocytopenia 06/04/2023   Hyperchloremia 05/29/2023   Recurrent falls 05/29/2023   Vitamin B12 deficiency (non anemic) 05/25/2023   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   Chronic pain syndrome 04/08/2023   Encounter for general adult medical examination with abnormal findings 02/05/2022   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/22/2021   Chronic prescription opiate use 03/21/2021   H/O cardiac pacemaker 04/12/2020   Chronic atrial fibrillation (HCC) 04/12/2020   Alzheimer's disease (HCC) 04/12/2020   DDD (degenerative disc disease), lumbar 04/12/2020   Mixed hyperlipidemia  04/12/2020   Sinus node dysfunction (HCC) 01/02/2020   Elevated PSA 06/20/2019    Orientation RESPIRATION BLADDER Height & Weight     Self  Normal Incontinent Weight: 134 lb 7.7 oz (61 kg) Height:  6' (182.9 cm)  BEHAVIORAL SYMPTOMS/MOOD NEUROLOGICAL BOWEL NUTRITION STATUS    Convulsions/Seizures Incontinent Diet (See d/c summary)  AMBULATORY STATUS COMMUNICATION OF NEEDS Skin   Extensive Assist Verbally Skin abrasions, Bruising, Other (Comment) (redness to bilateral buttocks)                       Personal Care Assistance Level of Assistance  Bathing, Feeding, Dressing Bathing Assistance: Maximum assistance Feeding assistance: Limited assistance Dressing Assistance: Maximum assistance     Functional Limitations Info             SPECIAL CARE FACTORS FREQUENCY  PT (By licensed PT), OT (By licensed OT)     PT Frequency: 5x weekly OT Frequency: 5x weekly            Contractures      Additional Factors Info  Code Status, Allergies, Psychotropic Code Status Info: DNR- pre-arrest interventions desired Allergies Info: No known allergies Psychotropic Info: Aricept , Ativan , Seroquel         Current Medications (12/14/2023):  This is the current hospital active medication list Current Facility-Administered Medications  Medication Dose Route Frequency Provider Last Rate Last Admin   acetaminophen  (TYLENOL ) tablet 650 mg  650 mg Oral Q6H PRN Adefeso, Oladapo, DO       Or   acetaminophen  (TYLENOL ) suppository 650 mg  650 mg Rectal Q6H PRN Adefeso, Oladapo, DO       apixaban  (  ELIQUIS ) tablet 5 mg  5 mg Oral BID Adefeso, Oladapo, DO   5 mg at 12/14/23 0902   donepezil  (ARICEPT ) tablet 10 mg  10 mg Oral QHS Adefeso, Oladapo, DO       feeding supplement (ENSURE PLUS HIGH PROTEIN) liquid 237 mL  237 mL Oral TID BM Adefeso, Oladapo, DO   237 mL at 12/14/23 1206   gabapentin  (NEURONTIN ) capsule 800 mg  800 mg Oral QID Sigdel, Santosh, MD   800 mg at 12/14/23 1108    lactated ringers  infusion   Intravenous Continuous Sigdel, Santosh, MD 150 mL/hr at 12/14/23 0903 Rate Change at 12/14/23 9096   levETIRAcetam  (KEPPRA ) tablet 1,000 mg  1,000 mg Oral BID Adefeso, Oladapo, DO   1,000 mg at 12/14/23 9097   LORazepam  (ATIVAN ) tablet 2 mg  2 mg Oral Q3H PRN Sigdel, Santosh, MD   2 mg at 12/14/23 1206   memantine  (NAMENDA ) tablet 10 mg  10 mg Oral BID Adefeso, Oladapo, DO   10 mg at 12/14/23 0902   multivitamin with minerals tablet 1 tablet  1 tablet Oral Daily Adefeso, Oladapo, DO   1 tablet at 12/14/23 9097   prochlorperazine (COMPAZINE) injection 10 mg  10 mg Intravenous Q6H PRN Adefeso, Oladapo, DO         Discharge Medications: Please see discharge summary for a list of discharge medications.  Relevant Imaging Results:  Relevant Lab Results:   Additional Information SSN: [id number]  Mcarthur Saddie Kim, LCSW

## 2023-12-14 NOTE — Evaluation (Signed)
 Occupational Therapy Evaluation Patient Details Name: LABIB CWYNAR MRN: [id number] DOB: 31-Aug-1955 Today's Date: 12/14/2023   History of Present Illness   68 y.o. M admitted on 12/13/2023 due to a fall and 8+ hours on the ground resulting in Rhabdomyolysis. PMH significant for atrial fibrillation, hyperlipidemia, seizures, dementia.     Clinical Impressions Pt admitted for concerns listed above. PTA pt reported that he was independent with all ADL's and IADL's, however due to underlying dementia, pt is a poor historian. At this time he is requiring up to min assist, as well as verbal cuing due to decreased ability to follow simple 1 step commands. Recommending further skilled OT once medically discharged, Acute OT will continue to follow.      If plan is discharge home, recommend the following:   A little help with walking and/or transfers;A little help with bathing/dressing/bathroom;Assistance with cooking/housework;Assist for transportation;Help with stairs or ramp for entrance;Direct supervision/assist for medications management     Functional Status Assessment   Patient has had a recent decline in their functional status and demonstrates the ability to make significant improvements in function in a reasonable and predictable amount of time.     Equipment Recommendations   None recommended by OT     Recommendations for Other Services         Precautions/Restrictions   Precautions Precautions: Fall Recall of Precautions/Restrictions: Intact Restrictions Weight Bearing Restrictions Per Provider Order: No     Mobility Bed Mobility Overal bed mobility: Needs Assistance Bed Mobility: Supine to Sit     Supine to sit: Contact guard     General bed mobility comments: increased time and heavy assist    Transfers Overall transfer level: Needs assistance Equipment used: Rolling walker (2 wheels) Transfers: Sit to/from Stand, Bed to  chair/wheelchair/BSC Sit to Stand: Contact guard assist, Min assist     Step pivot transfers: Min assist, Contact guard assist     General transfer comment: Mildly unsteady in standing, best with RW      Balance Overall balance assessment: Needs assistance Sitting-balance support: Feet supported, No upper extremity supported Sitting balance-Leahy Scale: Good Sitting balance - Comments: seated EOB   Standing balance support: Bilateral upper extremity supported, Reliant on assistive device for balance, During functional activity Standing balance-Leahy Scale: Fair                             ADL either performed or assessed with clinical judgement   ADL Overall ADL's : Needs assistance/impaired Eating/Feeding: Set up;Sitting   Grooming: Contact guard assist;Standing   Upper Body Bathing: Set up;Sitting   Lower Body Bathing: Supervison/ safety;Sitting/lateral leans;Contact guard assist   Upper Body Dressing : Set up;Sitting   Lower Body Dressing: Contact guard assist;Sitting/lateral leans;Sit to/from stand   Toilet Transfer: Contact guard assist;Minimal assistance;Ambulation   Toileting- Clothing Manipulation and Hygiene: Contact guard assist;Sitting/lateral lean;Sit to/from stand       Functional mobility during ADLs: Contact guard assist;Rolling walker (2 wheels) General ADL Comments: Requires RW and CGA to min assist     Vision Baseline Vision/History: 0 No visual deficits Ability to See in Adequate Light: 0 Adequate Patient Visual Report: No change from baseline Vision Assessment?: No apparent visual deficits     Perception         Praxis         Pertinent Vitals/Pain Pain Assessment Pain Assessment: No/denies pain     Extremity/Trunk Assessment Upper Extremity Assessment  Upper Extremity Assessment: Overall WFL for tasks assessed   Lower Extremity Assessment Lower Extremity Assessment: Defer to PT evaluation   Cervical / Trunk  Assessment Cervical / Trunk Assessment: Normal   Communication Communication Communication: No apparent difficulties Factors Affecting Communication: Hearing impaired   Cognition Arousal: Alert Behavior During Therapy: WFL for tasks assessed/performed Cognition: History of cognitive impairments, Cognition impaired   Orientation impairments: Place, Time, Situation                           Following commands: Impaired Following commands impaired: Follows one step commands inconsistently     Cueing  General Comments   Cueing Techniques: Verbal cues;Tactile cues;Visual cues  VSS on RA   Exercises     Shoulder Instructions      Home Living Family/patient expects to be discharged to:: Unsure                                 Additional Comments: Pt poor historian, per chart pt being followed by APS.      Prior Functioning/Environment Prior Level of Function : Patient poor historian/Family not available             Mobility Comments: Pt reports independent ambulation. ADLs Comments: Pt reports independence with ADL's and assist for IADL's.    OT Problem List: Decreased strength;Decreased activity tolerance;Decreased range of motion;Impaired balance (sitting and/or standing);Decreased cognition   OT Treatment/Interventions: Self-care/ADL training;Therapeutic exercise;DME and/or AE instruction;Therapeutic activities;Cognitive remediation/compensation;Patient/family education      OT Goals(Current goals can be found in the care plan section)   Acute Rehab OT Goals Patient Stated Goal: None stated OT Goal Formulation: With patient Time For Goal Achievement: 12/28/23 Potential to Achieve Goals: Good ADL Goals Pt Will Perform Grooming: with modified independence;standing Pt Will Perform Lower Body Dressing: with modified independence;sit to/from stand Pt Will Transfer to Toilet: with modified independence;ambulating Pt Will Perform Toileting  - Clothing Manipulation and hygiene: with modified independence;sit to/from stand   OT Frequency:  Min 1X/week    Co-evaluation PT/OT/SLP Co-Evaluation/Treatment: Yes Reason for Co-Treatment: Necessary to address cognition/behavior during functional activity;To address functional/ADL transfers   OT goals addressed during session: ADL's and self-care;Strengthening/ROM      AM-PAC OT 6 Clicks Daily Activity     Outcome Measure Help from another person eating meals?: A Little Help from another person taking care of personal grooming?: A Little Help from another person toileting, which includes using toliet, bedpan, or urinal?: A Little Help from another person bathing (including washing, rinsing, drying)?: A Little Help from another person to put on and taking off regular upper body clothing?: A Little Help from another person to put on and taking off regular lower body clothing?: A Little 6 Click Score: 18   End of Session Equipment Utilized During Treatment: Rolling walker (2 wheels) Nurse Communication: Mobility status  Activity Tolerance: Patient tolerated treatment well Patient left: in chair;with call bell/phone within reach;with chair alarm set  OT Visit Diagnosis: Unsteadiness on feet (R26.81);Other abnormalities of gait and mobility (R26.89);Muscle weakness (generalized) (M62.81);Other symptoms and signs involving cognitive function                Time: 8995-8976 OT Time Calculation (min): 19 min Charges:  OT General Charges $OT Visit: 1 Visit OT Evaluation $OT Eval Moderate Complexity: 1 Mod  Jaely Silman, OTR/L [employer] Acute Rehab  Reshad Saab Elane  Thelbert 12/14/2023, 12:40 PM

## 2023-12-14 NOTE — Progress Notes (Addendum)
 Progress Note  RN called because patient had fallen out of bed and sustained laceration to the right eyebrow.  At bedside, patient was in no distress, blood was oozing from the laceration site.  ED physician (Dr. Roselyn) was asked to help with laceration with suture. CT of head without contrast s/p fall was done and showed No acute intracranial abnormality. New and/or increased soft tissue contusion at the right supraorbital/periorbital region. No calvarial fracture.  Physical Exam  BP 125/70 (BP Location: Right Arm)   Pulse 88   Temp 97.7 F (36.5 C) (Oral)   Resp 15   Ht 6' (1.829 m)   Wt 61 kg   SpO2 98%   BMI 18.24 kg/m   Gen:- Awake Alert,  x 3 HEENT:-Laceration to right supraorbital region, No sclera icterus Neck-Supple Neck,No JVD,.  Lungs-  CTAB  CV- S1, S2 normal Abd-  +ve B.Sounds, Abd Soft, No tenderness,    Extremity/Skin:- No  edema,   warm and dry Psych-affect is appropriate Neuro-no new focal deficits, no tremors   Assessment and plan Fall (out of bed) Laceration of right eyebrow Suture of laceration performed by EDP CT of head without contrast showed no acute intracranial abnormality Continue Tylenol  as needed Continue fall precaution Continue PT/OT eval and treat  Lactic acidosis Lactic acid 2.9 > 2.1 > 1.3 > 5.3 1 L LR bolus provided Patient denies any complaint Continue to trend lactic acid  Total time:  33 minutes This includes time reviewing the chart including progress notes, labs, EKGs, taking medical decisions, ordering labs and documenting findings.  Please refer to admission H&P for details regarding the care of this patient

## 2023-12-14 NOTE — Plan of Care (Signed)

## 2023-12-14 NOTE — Evaluation (Signed)
 Physical Therapy Evaluation Patient Details Name: Jonathan Poole MRN: [id number] DOB: [birth year] Today's Date: 12/14/2023  History of Present Illness  68 y.o. M admitted on 12/13/2023 due to a fall and 8+ hours on the ground resulting in Rhabdomyolysis. PMH significant for atrial fibrillation, hyperlipidemia, seizures, dementia.  Clinical Impression  Pt. Presented with general weakness secondary to rhabdomyolysis,pt. Had cognitive deficits and had trouble understanding commands during treatment, and needed cuing. Pt was able to perform bed mobility, transfer and ambulate with CGA and w/ RW. Pt was able to complete tasks with repeated cuing. Pt was returned to bed at the end of treatment, with a call bell. Nursing staff was notified about pt. Status. Patient will benefit from continued skilled physical therapy in hospital and recommended venue below to increase strength, balance, endurance for safe ADLs and gait.       If plan is discharge home, recommend the following: A little help with walking and/or transfers;Assistance with cooking/housework   Can travel by private vehicle   Yes    Equipment Recommendations None recommended by PT  Recommendations for Other Services       Functional Status Assessment Patient has had a recent decline in their functional status and demonstrates the ability to make significant improvements in function in a reasonable and predictable amount of time.     Precautions / Restrictions Precautions Precautions: Fall Recall of Precautions/Restrictions: Intact Restrictions Weight Bearing Restrictions Per Provider Order: No      Mobility  Bed Mobility Overal bed mobility: Needs Assistance Bed Mobility: Supine to Sit     Supine to sit: Contact guard     General bed mobility comments: increased time and heavy assist due to cognitive deficits    Transfers Overall transfer level: Needs assistance Equipment used: Rolling walker (2  wheels) Transfers: Sit to/from Stand, Bed to chair/wheelchair/BSC Sit to Stand: Contact guard assist, Min assist   Step pivot transfers: Min assist, Contact guard assist       General transfer comment: Mildly unsteady in standing, best with RW    Ambulation/Gait Ambulation/Gait assistance: Supervision, Contact guard assist Gait Distance (Feet): 47 Feet Assistive device: None, Rolling walker (2 wheels) Gait Pattern/deviations: Trunk flexed, Decreased step length - right, Step-through pattern, Decreased step length - left, Decreased stance time - right, Decreased stride length, Narrow base of support       General Gait Details: Pt. was able to ambulate with w/ RW and CGA. Pt has cognitive deficits and needed cuing for directional changes during ambulation.  Stairs            Wheelchair Mobility     Tilt Bed    Modified Rankin (Stroke Patients Only)       Balance Overall balance assessment: Needs assistance Sitting-balance support: Feet supported, No upper extremity supported Sitting balance-Leahy Scale: Good Sitting balance - Comments: seated EOB   Standing balance support: Bilateral upper extremity supported, Reliant on assistive device for balance, During functional activity Standing balance-Leahy Scale: Fair Standing balance comment: without AD                             Pertinent Vitals/Pain Pain Assessment Pain Assessment: No/denies pain    Home Living Family/patient expects to be discharged to:: Private residence Living Arrangements: Spouse/significant other Available Help at Discharge: Family;Available 24 hours/day Type of Home: House Home Access: Level entry       Home Layout: One level Home Equipment: None Additional  Comments: Pt poor historian, per chart pt being followed by APS.    Prior Function Prior Level of Function : Patient poor historian/Family not available             Mobility Comments: Pt reports independent  ambulation. ADLs Comments: Pt reports independence with ADL's and assist for IADL's.     Extremity/Trunk Assessment   Upper Extremity Assessment Upper Extremity Assessment: Defer to OT evaluation    Lower Extremity Assessment Lower Extremity Assessment: Generalized weakness    Cervical / Trunk Assessment Cervical / Trunk Assessment: Normal  Communication   Communication Communication: No apparent difficulties Factors Affecting Communication: Hearing impaired    Cognition Arousal: Alert Behavior During Therapy: WFL for tasks assessed/performed   PT - Cognitive impairments: History of cognitive impairments, Attention                       PT - Cognition Comments: pt. had diffculty understanding initial commands Following commands: Impaired Following commands impaired: Follows one step commands inconsistently     Cueing Cueing Techniques: Verbal cues, Tactile cues, Visual cues, Gestural cues     General Comments General comments (skin integrity, edema, etc.): VSS on RA    Exercises     Assessment/Plan    PT Assessment Patient needs continued PT services  PT Problem List Decreased strength;Decreased cognition;Decreased range of motion;Decreased activity tolerance;Decreased balance;Decreased mobility       PT Treatment Interventions DME instruction;Gait training;Stair training;Cognitive remediation;Functional mobility training;Patient/family education;Therapeutic activities;Therapeutic exercise;Balance training    PT Goals (Current goals can be found in the Care Plan section)  Acute Rehab PT Goals Patient Stated Goal: pt. wants to return home PT Goal Formulation: With patient Time For Goal Achievement: 12/19/23 Potential to Achieve Goals: Good    Frequency Min 3X/week     Co-evaluation PT/OT/SLP Co-Evaluation/Treatment: Yes Reason for Co-Treatment: Necessary to address cognition/behavior during functional activity;To address functional/ADL  transfers PT goals addressed during session: Mobility/safety with mobility;Strengthening/ROM OT goals addressed during session: ADL's and self-care;Strengthening/ROM       AM-PAC PT 6 Clicks Mobility  Outcome Measure Help needed turning from your back to your side while in a flat bed without using bedrails?: A Little Help needed moving from lying on your back to sitting on the side of a flat bed without using bedrails?: A Little Help needed moving to and from a bed to a chair (including a wheelchair)?: A Little Help needed standing up from a chair using your arms (e.g., wheelchair or bedside chair)?: A Little Help needed to walk in hospital room?: A Little Help needed climbing 3-5 steps with a railing? : A Lot 6 Click Score: 17    End of Session Equipment Utilized During Treatment: Gait belt Activity Tolerance: Patient tolerated treatment well;Other (comment) (cognitive deficits) Patient left: in chair;with call bell/phone within reach Nurse Communication: Mobility status PT Visit Diagnosis: Unsteadiness on feet (R26.81);Repeated falls (R29.6);Muscle weakness (generalized) (M62.81)    Time: 8998-8975 PT Time Calculation (min) (ACUTE ONLY): 23 min   Charges:   PT Evaluation $PT Eval Moderate Complexity: 1 Mod PT Treatments $Therapeutic Activity: 23-37 mins PT General Charges $$ ACUTE PT VISIT: 1 Visit         Govind Furey, SPT

## 2023-12-14 NOTE — TOC Initial Note (Addendum)
 Transition of Care [facility]) - Initial/Assessment Note    Patient Details  Name: Jonathan Poole MRN: [id number] Date of Birth: 04/01/1955  [facility] CM/SW Contact:    Mcarthur Saddie Kim, LCSW Phone Number: 12/14/2023, 2:42 PM  Clinical Narrative:  Pt admitted with rhabdomyolysis. Assessment completed due to high risk readmission score. LCSW attempted to reach pt's wife several times today and left voicemail requesting return call. LCSW received call from [place] with [facility] [phone]) reporting pt has open APS case. She states she is also filing protective order. Discussed recommendation of SNF with Musu. She indicates to discuss with wife who is also pt's guardian and if she is agreeable then to move forward with placement. If not, TOC will need to wait on protective order to start SNF process. MD and RN updated. TOC will continue to follow.                 Update: Pt is active with [facility] at home. Musu aware hospice will have to be revoked in order for pt to go to SNF for rehab.   Expected Discharge Plan: Skilled Nursing Facility Barriers to Discharge: Continued Medical Work up   Patient Goals and CMS Choice Patient states their goals for this hospitalization and ongoing recovery are:: anticipate SNF          Expected Discharge Plan and Services In-house Referral: Clinical Social Work     Living arrangements for the past 2 months: Single Family Home                                      Prior Living Arrangements/Services Living arrangements for the past 2 months: Single Family Home Lives with:: Spouse Patient language and need for interpreter reviewed:: Yes        Need for Family Participation in Patient Care: Yes (Comment)     Criminal Activity/Legal Involvement Pertinent to Current Situation/Hospitalization: No - Comment as needed  Activities of Daily Living   ADL Screening (condition at time of  admission) Independently performs ADLs?: No Does the patient have a NEW difficulty with bathing/dressing/toileting/self-feeding that is expected to last >3 days?: Yes (Initiates electronic notice to provider for possible OT consult) Does the patient have a NEW difficulty with getting in/out of bed, walking, or climbing stairs that is expected to last >3 days?: Yes (Initiates electronic notice to provider for possible PT consult) Does the patient have a NEW difficulty with communication that is expected to last >3 days?: Yes (Initiates electronic notice to provider for possible SLP consult) Is the patient deaf or have difficulty hearing?: No Does the patient have difficulty seeing, even when wearing glasses/contacts?: No Does the patient have difficulty concentrating, remembering, or making decisions?: Yes  Permission Sought/Granted                  Emotional Assessment   Attitude/Demeanor/Rapport: Unable to Assess Affect (typically observed): Unable to Assess Orientation: : Oriented to Self Alcohol  / Substance Use: Not Applicable Psych Involvement: No (comment)  Admission diagnosis:  Rhabdomyolysis [M62.82] Patient Active Problem List   Diagnosis Date Noted   Rhabdomyolysis 12/13/2023   Failure to thrive in adult 11/29/2023   Seizure (HCC) 11/29/2023   Aspiration pneumonia (HCC) 11/28/2023   Protein-calorie malnutrition, severe 07/30/2023   Acute conjunctivitis of right eye 07/30/2023   Acute metabolic encephalopathy 07/29/2023   AKI (acute kidney injury)  07/29/2023   Lactic acidosis 07/29/2023   Other encephalopathy 07/29/2023   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   Dementia with behavioral disturbance (HCC) 06/19/2023   Memory loss 06/05/2023   Restlessness and agitation 06/05/2023   Encephalopathy 06/04/2023   Hyperbilirubinemia 06/04/2023   Thrombocytopenia 06/04/2023   Hyperchloremia 05/29/2023   Recurrent falls 05/29/2023   Vitamin B12 deficiency  (non anemic) 05/25/2023   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   Chronic pain syndrome 04/08/2023   Encounter for general adult medical examination with abnormal findings 02/05/2022   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/22/2021   Chronic prescription opiate use 03/21/2021   H/O cardiac pacemaker 04/12/2020   Chronic atrial fibrillation (HCC) 04/12/2020   Alzheimer's disease (HCC) 04/12/2020   DDD (degenerative disc disease), lumbar 04/12/2020   Mixed hyperlipidemia 04/12/2020   Sinus node dysfunction (HCC) 01/02/2020   Elevated PSA 06/20/2019   PCP:  [name] [name], MD Pharmacy:   [facility] [phone] - [place], [place] - [address] AT [facility] [address] [place] [address] Phone: [phone] Fax: [phone]  [facility] [place], [place] - [address] [place] [address] Phone: [phone] Fax: [phone]     Social Drivers of Health (SDOH) Social History: SDOH Screenings   Food Insecurity: Patient Unable To Answer (12/13/2023)  Housing: Patient Unable To Answer (12/13/2023)  Transportation Needs: Patient Unable To Answer (12/13/2023)  Utilities: Patient Unable To Answer (12/13/2023)  Alcohol  Screen: Low Risk  (04/10/2023)  Depression (PHQ2-9): Medium Risk (11/02/2023)  Financial Resource Strain: High Risk (11/18/2023)  Physical Activity: Inactive (04/10/2023)  Social Connections: Unknown (12/14/2023)  Stress: No Stress Concern Present (04/10/2023)  Tobacco Use: Medium Risk (12/13/2023)  Health Literacy: Inadequate Health Literacy (11/16/2023)   SDOH Interventions:     Readmission Risk Interventions    12/14/2023    2:40 PM 11/30/2023   12:49 PM 11/29/2023   10:59 AM  Readmission Risk Prevention Plan  Transportation Screening Complete Complete Complete  PCP or Specialist Appt within 5-7 Days   Complete  Home Care Screening  Complete Complete  Medication Review (RN CM)  Complete Complete  HRI or Home  Care Consult Complete    Social Work Consult for Recovery Care Planning/Counseling Complete    Palliative Care Screening Not Applicable    Medication Review Oceanographer) Complete

## 2023-12-14 NOTE — Progress Notes (Signed)
 PROGRESS NOTE    Jonathan Poole  FMW:[id number] DOB: March 19, 1955 DOA: 12/13/2023 PCP: [name] [name], MD   Brief Narrative:   Jonathan Poole is a 68 y.o. male with medical history significant of atrial fibrillation, hyperlipidemia, seizures, dementia who presents to the emergency department from home via EMS due to fall sustained at home.  At bedside, patient was unable to provide history possibly due to underlying dementia, history was obtained from ED PA and ED medical record.  Per report, EMS was activated and patient was found lying in his bathroom floor for an unknown period of time.  Living situation was brought per EMS report and he sustained abrasions to his forehead without any other complaints.   ED course In the emergency department, he was hemodynamically stable.  Workup in the ED showed normocytic anemia.  BMP was significant for sodium of 146, blood glucose 122, BUN 32, creatinine 1.86 (baseline creatinine at 0.7-0.9), total CK 3,632, troponin 27 > 28, lactic acid 2.9 > 2.1.,  Magnesium 2.0. CT head without contrast showed no acute rectal abnormality CT cervical spine without contrast showed no acute cervical spine fracture or traumatic malalignment Pelvic x-ray showed no significant normality and chest x-ray showed no acute cardiopulmonary process IV hydration was provided.   TRH was asked to admit patient.   Assessment & Plan:   Principal Problem:   Rhabdomyolysis Active Problems:   AKI (acute kidney injury)   Alzheimer's disease (HCC)   Chronic atrial fibrillation (HCC)   H/O cardiac pacemaker   Recurrent falls   Complex partial epileptic seizure (HCC)   Rhabdomyolysis Total CK 3,632 Continue IV hydration Continue to monitor total CK  right forehead laceration: stitched in the ER   High anion gap metabolic acidosis possibly due to above and due to dehydration Anion gap 23, continue IV hydration and monitor for anion gap closure   Lactic acidosis due to  rhabdomyolysis and dehydration Dehydration Lactic acid 2.9 > 2.1;  Continue IV hydration   Hypernatremia due to dehydration Na 146, continue IV hydration Continue to monitor follow-up BMP with morning labs   Elevated troponin possibly due to type II demand ischemia Troponin 27 > 28; patient denies chest pain Continue to trend troponin   Prolonged QT interval QTc 509 ms Avoid QT prolonging drugs Magnesium level was 2.0 Repeat EKG in the morning   Fall at home Patient has a history of recurrent falls Continue fall precaution Continue PT/OT eval and treat   Dementia Continue Aricept  and Namenda , multivitamins and nutritional supplements.  Continue delirium precaution   Chronic atrial fibrillation Continue Eliquis  Patient was not on any rate control medication per med rec   Seizure disorder Continue Keppra    Will resume home medications once reconciled     Advance Care Planning: DNR   Consults: None   Family Communication: None at bedside   Severity of Illness: The appropriate patient status for this patient is INPATIENT. Inpatient status is judged to be reasonable and necessary in order to provide the required intensity of service to ensure the patient's safety. The patient's presenting symptoms, physical exam findings, and initial radiographic and laboratory data in the context of their chronic comorbidities is felt to place them at high risk for further clinical deterioration. Furthermore, it is not anticipated that the patient will be medically stable for discharge from the hospital within 2 midnights of admission.    * I certify that at the point of admission it is my clinical judgment that  the patient will require inpatient hospital care spanning beyond 2 midnights from the point of admission due to high intensity of service, high risk for further deterioration and high frequency of surveillance required.*     Antimicrobials:    Subjective:  Patient seen and  examined at the bedside.  He is alert, confused which is his baseline.  Had a fall last night leading to laceration of right forehead which was sutured.  Patient is requiring bedside sitter due to agitation and fall risk.  Will resume home medications once reconciled  objective: Vitals:   12/14/23 0235 12/14/23 0300 12/14/23 0847 12/14/23 1151  BP:  103/62 119/73 106/64  Pulse:  74 99 76  Resp:      Temp:  98.6 F (37 C) 98.1 F (36.7 C) 98.6 F (37 C)  TempSrc:  Oral Oral Oral  SpO2: 98% 99% 97% 90%  Weight:      Height:        Intake/Output Summary (Last 24 hours) at 12/14/2023 1307 Last data filed at 12/14/2023 0830 Gross per 24 hour  Intake 120 ml  Output --  Net 120 ml   Filed Weights   12/13/23 1746 12/13/23 2208  Weight: 71 kg 61 kg    Examination:  General exam: Appears calm and comfortable  Respiratory system: Bilateral decreased breath sounds at bases Cardiovascular system: S1 & S2 heard, Rate controlled Gastrointestinal system: Abdomen is nondistended, soft and nontender. Normal bowel sounds heard. Extremities: No cyanosis, clubbing, edema  Central nervous system: Alert and oriented. No focal neurological deficits. Moving extremities Skin: No rashes, lesions or ulcers Psychiatry: Judgement and insight appear normal. Mood & affect appropriate.     Data Reviewed: I have personally reviewed following labs and imaging studies  CBC: Recent Labs  Lab 12/13/23 1855 12/14/23 0430  WBC 6.7 5.6  NEUTROABS 5.8  --   HGB 12.0* 10.6*  HCT 36.4* 31.7*  MCV 88.1 88.3  PLT 160 171   Basic Metabolic Panel: Recent Labs  Lab 12/13/23 1855 12/14/23 0430  NA 146* 145  K 4.2 3.9  CL 99 102  CO2 25 27  GLUCOSE 122* 103*  BUN 32* 31*  CREATININE 1.86* 1.29*  CALCIUM  9.6 8.9  MG 2.0 1.9  PHOS  --  3.5   GFR: Estimated Creatinine Clearance: 47.3 mL/min (A) (by C-G formula based on SCr of 1.29 mg/dL (H)). Liver Function Tests: Recent Labs  Lab 12/13/23 1855  12/14/23 0430  AST 86* 154*  ALT 25 34  ALKPHOS 66 56  BILITOT 0.7 0.5  PROT 6.7 5.6*  ALBUMIN 4.2 3.6   No results for input(s): LIPASE, AMYLASE in the last 168 hours. Recent Labs  Lab 12/13/23 1855  AMMONIA <13   Coagulation Profile: No results for input(s): INR, PROTIME in the last 168 hours. Cardiac Enzymes: Recent Labs  Lab 12/13/23 1855 12/14/23 0004  CKTOTAL 3,632* 6,351*   BNP (last 3 results) No results for input(s): PROBNP in the last 8760 hours. HbA1C: No results for input(s): HGBA1C in the last 72 hours. CBG: No results for input(s): GLUCAP in the last 168 hours. Lipid Profile: No results for input(s): CHOL, HDL, LDLCALC, TRIG, CHOLHDL, LDLDIRECT in the last 72 hours. Thyroid  Function Tests: No results for input(s): TSH, T4TOTAL, FREET4, T3FREE, THYROIDAB in the last 72 hours. Anemia Panel: No results for input(s): VITAMINB12, FOLATE, FERRITIN, TIBC, IRON, RETICCTPCT in the last 72 hours. Sepsis Labs: Recent Labs  Lab 12/14/23 0004 12/14/23 0131 12/14/23 0707 12/14/23  1029  LATICACIDVEN 1.3 5.3* 2.2* 1.7    No results found for this or any previous visit (from the past 240 hours).       Radiology Studies: CT HEAD WO CONTRAST ( ) Result Date: 12/14/2023 CLINICAL DATA:  Initial evaluation for acute trauma, fall. EXAM: CT HEAD WITHOUT CONTRAST TECHNIQUE: Contiguous axial images were obtained from the base of the skull through the vertex without intravenous contrast. RADIATION DOSE REDUCTION: This exam was performed according to the departmental dose-optimization program which includes automated exposure control, adjustment of the mA and/or kV according to patient size and/or use of iterative reconstruction technique. COMPARISON:  Prior CT from 12/13/2023. FINDINGS: Brain: Mild age-related cerebral atrophy with chronic small vessel ischemic disease. No acute intracranial hemorrhage. No acute large vessel  territory infarct. No mass lesion or midline shift. No hydrocephalus or extra-axial fluid collection. Vascular: No abnormal hyperdense vessel. Scattered vascular calcifications noted within the carotid siphons. Skull: New and/or increased soft tissue contusion present at the right supraorbital/periorbital region. Calvarium intact without fracture. Sinuses/Orbits: Globes and orbital soft tissues within normal limits. Mild scattered mucosal thickening present about the ethmoidal air cells and left maxillary sinus. Paranasal sinuses are otherwise clear. Mastoid air cells and middle ear cavities are clear. Other: None. IMPRESSION: 1. No acute intracranial abnormality. 2. New and/or increased soft tissue contusion at the right supraorbital/periorbital region. No calvarial fracture. 3. Mild age-related cerebral atrophy with chronic small vessel ischemic disease. Electronically Signed   By: Morene Hoard M.D.   On: 12/14/2023 03:50   CT Cervical Spine Wo Contrast Result Date: 12/13/2023 EXAM: CT CERVICAL SPINE WITHOUT CONTRAST 12/13/2023 06:43:24 PM TECHNIQUE: CT of the cervical spine was performed without the administration of intravenous contrast. Multiplanar reformatted images are provided for review. Automated exposure control, iterative reconstruction, and/or weight based adjustment of the mA/kV was utilized to reduce the radiation dose to as low as reasonably achievable. COMPARISON: CT cervical spine 06/03/2023. CLINICAL HISTORY: Ataxia, cervical trauma. Found down on floor. FINDINGS: CERVICAL SPINE: BONES AND ALIGNMENT: Cervical spine straightening. No listhesis. No acute fracture or suspicious lesion. DEGENERATIVE CHANGES: Moderate disc space narrowing with degenerative endplate spurring at C4-C5 and C5-C6. Up to moderate multilevel facet arthrosis. No evidence of high grade spinal canal stenosis. Moderate neural foraminal stenosis on the left at C4-C5 and on the right at C5-C6. SOFT TISSUES: No  prevertebral soft tissue swelling. Asymmetrically prominent calcified plaque about the left carotid bifurcation. IMPRESSION: 1. No acute cervical spine fracture or traumatic malalignment. Electronically signed by: Dasie Hamburg MD 12/13/2023 06:58 PM EST RP Workstation: [id number]   CT Head Wo Contrast Result Date: 12/13/2023 EXAM: CT HEAD WITHOUT CONTRAST 12/13/2023 06:43:24 PM TECHNIQUE: CT of the head was performed without the administration of intravenous contrast. Automated exposure control, iterative reconstruction, and/or weight based adjustment of the mA/kV was utilized to reduce the radiation dose to as low as reasonably achievable. COMPARISON: CT head 07/28/2023. CLINICAL HISTORY: Head trauma, minor (Age >= 65y). Found down on floor. History of dementia. FINDINGS: BRAIN AND VENTRICLES: There is no evidence of an acute infarct, intracranial hemorrhage, mass, midline shift, hydrocephalus, or extra-axial fluid collection. There is mild cerebral atrophy. Cerebral white matter hypodensities are similar to the prior CT and nonspecific but compatible with mild chronic small vessel ischemic disease. ORBITS: No acute abnormality. SINUSES: Minimal mucosal thickening in the paranasal sinuses. Small mucous retention cyst in the left maxillary sinus. Clear mastoid air cells. SOFT TISSUES AND SKULL: Frontal scalp soft tissue swelling. No skull fracture.  IMPRESSION: 1. No acute intracranial abnormality. 2. Mild chronic small vessel ischemic disease. Electronically signed by: Dasie Hamburg MD 12/13/2023 06:53 PM EST RP Workstation: [id number]   DG Chest Port 1 View Result Date: 12/13/2023 EXAM: 1 VIEW(S) XRAY OF THE CHEST 12/13/2023 06:00:02 PM COMPARISON: Comparison to study dated 11/28/2023. CLINICAL HISTORY: fall fall FINDINGS: LINES, TUBES AND DEVICES: Right-sided pacemaker is unchanged compared to the prior study. LUNGS AND PLEURA: No focal pulmonary opacity. No pulmonary edema. No pleural effusion. No  pneumothorax. HEART AND MEDIASTINUM: Right-sided pacemaker is unchanged compared to the prior study. No acute abnormality of the cardiac and mediastinal silhouettes. BONES AND SOFT TISSUES: No acute osseous abnormality. IMPRESSION: 1. No acute cardiopulmonary process. Electronically signed by: Lynwood Seip MD 12/13/2023 06:14 PM EST RP Workstation: [id number]   DG Pelvis Portable Result Date: 12/13/2023 EXAM: 1 or 2 VIEW(S) XRAY OF THE PELVIS 12/13/2023 06:00:02 PM COMPARISON: None available. CLINICAL HISTORY: Fall. FINDINGS: BONES AND JOINTS: No acute fracture. No focal osseous lesion. No joint dislocation. SOFT TISSUES: The soft tissues are unremarkable. IMPRESSION: 1. No significant abnormality. Electronically signed by: Lynwood Seip MD 12/13/2023 06:13 PM EST RP Workstation: [id number]        Scheduled Meds:  apixaban   5 mg Oral BID   donepezil   10 mg Oral QHS   feeding supplement  237 mL Oral TID BM   gabapentin   800 mg Oral QID   levETIRAcetam   1,000 mg Oral BID   memantine   10 mg Oral BID   multivitamin with minerals  1 tablet Oral Daily   Continuous Infusions:  lactated ringers  150 mL/hr at 12/14/23 9096          Derryl Duval, MD [facility] 12/14/2023, 1:07 PM

## 2023-12-14 NOTE — Progress Notes (Signed)
 Lab made writer aware of Lactic acid result of 5.3. Writer made Dr. Lilli aware

## 2023-12-15 DIAGNOSIS — M6282 Rhabdomyolysis: Secondary | ICD-10-CM | POA: Diagnosis not present

## 2023-12-15 LAB — BASIC METABOLIC PANEL WITH GFR
Anion gap: 7 (ref 5–15)
BUN: 22 mg/dL (ref 8–23)
CO2: 35 mmol/L — ABNORMAL HIGH (ref 22–32)
Calcium: 8.9 mg/dL (ref 8.9–10.3)
Chloride: 104 mmol/L (ref 98–111)
Creatinine, Ser: 0.92 mg/dL (ref 0.61–1.24)
GFR, Estimated: >60 mL/min (ref >60)
Glucose, Bld: 93 mg/dL (ref 70–99)
Potassium: 3.1 mmol/L — ABNORMAL LOW (ref 3.5–5.1)
Sodium: 146 mmol/L — ABNORMAL HIGH (ref 135–145)

## 2023-12-15 LAB — URINE CULTURE: Culture: NO GROWTH

## 2023-12-15 LAB — CK: Total CK: 3706 U/L — ABNORMAL HIGH (ref 49–397)

## 2023-12-15 MED ORDER — POTASSIUM CHLORIDE CRYS ER 20 MEQ PO TBCR
40.0000 meq | EXTENDED_RELEASE_TABLET | ORAL | Status: AC
  Administered 2023-12-15 (×2): 40 meq via ORAL
  Filled 2023-12-15: qty 2
  Filled 2023-12-15: qty 4

## 2023-12-15 NOTE — Progress Notes (Addendum)
 Initial Nutrition Assessment  DOCUMENTATION CODES:   Severe malnutrition in context of social or environmental circumstances, Underweight  INTERVENTION:   Continue Ensure Plus High Protein po TID, each supplement provides 350 kcal and 20 grams of protein Liberalize diet to regular  NUTRITION DIAGNOSIS:   Severe Malnutrition related to social / environmental circumstances (poor oral intake r/t dementia) as evidenced by severe muscle depletion, severe fat depletion, percent weight loss (14% weight loss x 1 month).  GOAL:   Patient will meet greater than or equal to 90% of their needs  MONITOR:   PO intake, Supplement acceptance  REASON FOR ASSESSMENT:   Malnutrition Screening Tool    ASSESSMENT:   68 yo male admitted with rhabdomyolysis S/P fall. PMH includes dementia, A fib, seizures, heart disease, pacemaker.  Patient is followed by hospice at home.  He is unable to provide any nutrition hx d/t dementia/confusion. Weight history reviewed. Patient with 14% weight loss x 1 month. Currently on a heart healthy diet. Meal intakes 0-25%. He is also receiving Ensure supplements TID.  Labs reviewed.  Na 146 K 3.1 CK 3706, has started to trend down  Medications reviewed and include aricept , MVI, klor-con . IVF: LR at 150 ml/h   Patient meets criteria for severe malnutrition, given severe depletion of muscle and subcutaneous fat mass with 14% weight loss x 1 month.  NUTRITION - FOCUSED PHYSICAL EXAM:  Flowsheet Row Most Recent Value  Orbital Region Severe depletion  Upper Arm Region Severe depletion  Thoracic and Lumbar Region Moderate depletion  Buccal Region Severe depletion  Temple Region Severe depletion  Clavicle Bone Region Severe depletion  Clavicle and Acromion Bone Region Severe depletion  Scapular Bone Region Severe depletion  Dorsal Hand Unable to assess  Patellar Region Severe depletion  Anterior Thigh Region Severe depletion  Posterior Calf Region Severe  depletion  Edema (RD Assessment) None  Hair Reviewed  Eyes Reviewed  Mouth Reviewed  Skin Reviewed  Nails Reviewed    Diet Order:   Diet Order             Diet Heart Room service appropriate? Yes; Fluid consistency: Thin  Diet effective now                   EDUCATION NEEDS:   No education needs have been identified at this time  Skin:  Skin Assessment: Reviewed RN Assessment  Last BM:  11/4  Height:   Ht Readings from Last 1 Encounters:  12/13/23 6' (1.829 m)    Weight:   Wt Readings from Last 1 Encounters:  12/13/23 61 kg    Ideal Body Weight:  80.9 kg  BMI:  Body mass index is 18.24 kg/m.  Estimated Nutritional Needs:   Kcal:  1950-2150  Protein:  90-110 gm  Fluid:  1.8-2 L   Suzen HUNT RD, LDN, CNSC Contact via secure chat. If unavailable, use group chat RD Inpatient.

## 2023-12-15 NOTE — Progress Notes (Signed)
 Mobility Specialist Progress Note:    12/15/23 1459  Mobility  Activity Pivoted/transferred from chair to bed  Level of Assistance Moderate assist, patient does 50-74%  Assistive Device None  Distance Ambulated (ft) 3 ft  Range of Motion/Exercises Active;All extremities  Activity Response Tolerated well  Mobility Referral Yes  Mobility visit 1 Mobility  Mobility Specialist Start Time (ACUTE ONLY) 1459  Mobility Specialist Stop Time (ACUTE ONLY) 1519  Mobility Specialist Time Calculation (min) (ACUTE ONLY) 20 min   Pt received in chair, NT requesting assistance transferring to bed. Required ModA to stand and transfer with no AD. Tolerated well, confused. NT in room, all needs met.  Raheel Kunkle Mobility Specialist Please contact via Special Educational Needs Teacher or  Rehab office at [phone]

## 2023-12-15 NOTE — Progress Notes (Signed)
 Physical Therapy Treatment Patient Details Name: Jonathan Poole MRN: [id number] DOB: April 24, 1955 Today's Date: 12/15/2023   History of Present Illness 68 y.o. M admitted on 12/13/2023 due to a fall and 8+ hours on the ground resulting in Rhabdomyolysis. PMH significant for atrial fibrillation, hyperlipidemia, seizures, dementia.    PT Comments  Patient agreeable to work with PT. Patient was received sitting in chair with nursing staff present at start of session, whom exit when session starts. Patient requires min/mod assist to stand from chair this date and continues to require min/mod assist during ambulation with RW in room for safety due to pt tending to let go of RW to reach for things around room. Pt initially requesting to use restroom but denies need once in restroom. Pt easily distracted with objects around room and lines attached to him. Much verbal and tactile cueing needed this date. Pt returns to chair at end of session. Chair alarm is set and call button within reach. Patient will benefit from continued skilled physical therapy acutely and in recommended venue in order to address current deficits/impairments to improve overall function.     If plan is discharge home, recommend the following: Assistance with cooking/housework;A lot of help with walking and/or transfers;Supervision due to cognitive status;A little help with bathing/dressing/bathroom;Help with stairs or ramp for entrance;Assist for transportation   Can travel by private vehicle        Equipment Recommendations  None recommended by PT    Recommendations for Other Services       Precautions / Restrictions Precautions Precautions: Fall Recall of Precautions/Restrictions: Intact Restrictions Weight Bearing Restrictions Per Provider Order: No     Mobility  Bed Mobility     General bed mobility comments: Pt received sitting in recliner    Transfers Overall transfer level: Needs assistance Equipment  used: Rolling walker (2 wheels) Transfers: Sit to/from Stand Sit to Stand: Min assist, Mod assist           General transfer comment: Pt required min/mod assist for standing from chair with RW, 2 attempts independently initially    Ambulation/Gait Ambulation/Gait assistance: Min assist Gait Distance (Feet): 10 Feet Assistive device: Rolling walker (2 wheels) Gait Pattern/deviations: Trunk flexed, Decreased step length - right, Step-through pattern, Decreased step length - left, Decreased stance time - right, Decreased stride length, Narrow base of support, Knee flexed in stance - left, Knee flexed in stance - right, Knees buckling Gait velocity: Dec     General Gait Details: Ambulation limited to in room for maximum safety, uses RW but requires min/mod assist for managing RW and pt unsteadiness, pt presenting with inc confusion and reaching down for objects while ambulating, much verbal and tactile cueing throughout for upright posture and addressing inc knee flexion bilaterally   Stairs             Wheelchair Mobility     Tilt Bed    Modified Rankin (Stroke Patients Only)       Balance Overall balance assessment: Needs assistance Sitting-balance support: Feet supported, No upper extremity supported Sitting balance-Leahy Scale: Good Sitting balance - Comments: seated EOB   Standing balance support: Bilateral upper extremity supported, Reliant on assistive device for balance, During functional activity Standing balance-Leahy Scale: Poor Standing balance comment: w/ AD        Communication Communication Communication: No apparent difficulties  Cognition Arousal: Alert Behavior During Therapy: Restless, Impulsive   PT - Cognitive impairments: History of cognitive impairments, Attention     Following  commands: Impaired Following commands impaired: Follows one step commands inconsistently    Cueing Cueing Techniques: Verbal cues, Tactile cues, Visual cues,  Gestural cues  Exercises      General Comments        Pertinent Vitals/Pain Pain Assessment Pain Assessment: Faces Pain Score: 0-No pain    Home Living                          Prior Function            PT Goals (current goals can now be found in the care plan section) Acute Rehab PT Goals Patient Stated Goal: pt. wants to return home PT Goal Formulation: With patient Time For Goal Achievement: 12/19/23 Progress towards PT goals: Progressing toward goals    Frequency    Min 3X/week      PT Plan      Co-evaluation              AM-PAC PT 6 Clicks Mobility   Outcome Measure  Help needed turning from your back to your side while in a flat bed without using bedrails?: A Little Help needed moving from lying on your back to sitting on the side of a flat bed without using bedrails?: A Little Help needed moving to and from a bed to a chair (including a wheelchair)?: A Lot Help needed standing up from a chair using your arms (e.g., wheelchair or bedside chair)?: A Lot Help needed to walk in hospital room?: A Lot Help needed climbing 3-5 steps with a railing? : A Lot 6 Click Score: 14    End of Session Equipment Utilized During Treatment: Gait belt Activity Tolerance: Patient tolerated treatment well;Other (comment) (Limited due to cognition) Patient left: in chair;with call bell/phone within reach;with chair alarm set Nurse Communication: Mobility status PT Visit Diagnosis: Unsteadiness on feet (R26.81);Repeated falls (R29.6);Muscle weakness (generalized) (M62.81)     Time: 8954-8894 PT Time Calculation (min) (ACUTE ONLY): 20 min  Charges:    $Therapeutic Activity: 8-22 mins PT General Charges $$ ACUTE PT VISIT: 1 Visit                     1:53 PM, 12/15/23 Macil Crady Powell-Butler, PT, DPT [place] [facility]

## 2023-12-15 NOTE — Progress Notes (Signed)
 PROGRESS NOTE    Jonathan Poole  FMW:[id number] DOB: 29-Aug-1955 DOA: 12/13/2023 PCP: [name] [name], MD   Brief Narrative:   Jonathan Poole is a 68 y.o. male with medical history significant of atrial fibrillation, hyperlipidemia, seizures, dementia who presents to the emergency department from home via EMS due to fall sustained at home.  At bedside, patient was unable to provide history possibly due to underlying dementia, history was obtained from ED PA and ED medical record.  Per report, EMS was activated and patient was found lying in his bathroom floor for an unknown period of time.  Living situation was brought per EMS report and he sustained abrasions to his forehead without any other complaints.   ED course In the emergency department, he was hemodynamically stable.  Workup in the ED showed normocytic anemia.  BMP was significant for sodium of 146, blood glucose 122, BUN 32, creatinine 1.86 (baseline creatinine at 0.7-0.9), total CK 3,632, troponin 27 > 28, lactic acid 2.9 > 2.1.,  Magnesium 2.0. CT head without contrast showed no acute rectal abnormality CT cervical spine without contrast showed no acute cervical spine fracture or traumatic malalignment Pelvic x-ray showed no significant normality and chest x-ray showed no acute cardiopulmonary process  Patient admitted for rhabdomyolysis/multiple falls  Assessment & Plan:   Principal Problem:   Rhabdomyolysis Active Problems:   AKI (acute kidney injury)   Alzheimer's disease (HCC)   Chronic atrial fibrillation (HCC)   H/O cardiac pacemaker   Recurrent falls   Complex partial epileptic seizure (HCC)   Rhabdomyolysis Total CK 3,632-->6000->3500 Continue IV LR at 150 cc/h  continue to monitor total CK  right forehead laceration:  Suffered after a fall in the hospital Sutured  Anion gap metabolic acidosis secondary to dehydration: Resolved  Lactic acidosis due to rhabdomyolysis and  dehydration Dehydration Lactic acid 2.9 > 2.1;  Continue IV hydration   Hypernatremia due to dehydration Na 146, on admission has resolved.   Elevated troponin possibly due to type II demand ischemia Troponin 27 > 28; patient denies chest pain  Prolonged QT interval QTc 509 ms Avoid QT prolonging drugs Magnesium level was 2.0 Will repeat EKG to ensure normal QTc   Fall at home Patient has a history of recurrent falls Continue fall precaution Planning for SNF placement.  Dementia Continue Aricept  and Namenda , multivitamins and nutritional supplements.  Continue delirium precaution   Chronic atrial fibrillation Continue Eliquis  Patient is not on any rate control medication per med rec   Seizure disorder Continue Keppra       Advance Care Planning: DNR   Consults: None  Disposition: Patient from home.  Apparently was under hospice at home with [facility].  Planning for SNF.   Family Communication: None at bedside   Severity of Illness: The appropriate patient status for this patient is INPATIENT. Inpatient status is judged to be reasonable and necessary in order to provide the required intensity of service to ensure the patient's safety. The patient's presenting symptoms, physical exam findings, and initial radiographic and laboratory data in the context of their chronic comorbidities is felt to place them at high risk for further clinical deterioration. Furthermore, it is not anticipated that the patient will be medically stable for discharge from the hospital within 2 midnights of admission.    * I certify that at the point of admission it is my clinical judgment that the patient will require inpatient hospital care spanning beyond 2 midnights from the point of admission due to  high intensity of service, high risk for further deterioration and high frequency of surveillance required.*     Antimicrobials:    Subjective:  Patient seen and examined at the bedside.  He is  alert, confused which is his baseline.  Had a fall last night leading to laceration of right forehead which was sutured.  Patient is requiring bedside sitter due to agitation and fall risk.  Will resume home medications once reconciled  objective: Vitals:   12/14/23 1533 12/14/23 1925 12/14/23 2239 12/15/23 0535  BP: 98/62 100/63 95/75 (!) 130/101  Pulse: 74 73 63 71  Resp:   17 17  Temp: 98.6 F (37 C) 98 F (36.7 C) 97.6 F (36.4 C) 97.8 F (36.6 C)  TempSrc: Oral Axillary Oral Oral  SpO2: 98% 91% 96% 92%  Weight:      Height:        Intake/Output Summary (Last 24 hours) at 12/15/2023 1254 Last data filed at 12/14/2023 1700 Gross per 24 hour  Intake 240 ml  Output --  Net 240 ml   Filed Weights   12/13/23 1746 12/13/23 2208  Weight: 71 kg 61 kg    Examination:  General exam: Appears calm and comfortable  Respiratory system: Bilateral decreased breath sounds at bases Cardiovascular system: S1 & S2 heard, Rate controlled Gastrointestinal system: Abdomen is nondistended, soft and nontender. Normal bowel sounds heard. Extremities: No cyanosis, clubbing, edema  Central nervous system: Alert and oriented. No focal neurological deficits. Moving extremities Skin: No rashes, lesions or ulcers Psychiatry: Judgement and insight appear normal. Mood & affect appropriate.     Data Reviewed: I have personally reviewed following labs and imaging studies  CBC: Recent Labs  Lab 12/13/23 1855 12/14/23 0430  WBC 6.7 5.6  NEUTROABS 5.8  --   HGB 12.0* 10.6*  HCT 36.4* 31.7*  MCV 88.1 88.3  PLT 160 171   Basic Metabolic Panel: Recent Labs  Lab 12/13/23 1855 12/14/23 0430 12/15/23 0410  NA 146* 145 146*  K 4.2 3.9 3.1*  CL 99 102 104  CO2 25 27 35*  GLUCOSE 122* 103* 93  BUN 32* 31* 22  CREATININE 1.86* 1.29* 0.92  CALCIUM  9.6 8.9 8.9  MG 2.0 1.9  --   PHOS  --  3.5  --    GFR: Estimated Creatinine Clearance: 66.3 mL/min (by C-G formula based on SCr of 0.92  mg/dL). Liver Function Tests: Recent Labs  Lab 12/13/23 1855 12/14/23 0430  AST 86* 154*  ALT 25 34  ALKPHOS 66 56  BILITOT 0.7 0.5  PROT 6.7 5.6*  ALBUMIN 4.2 3.6   No results for input(s): LIPASE, AMYLASE in the last 168 hours. Recent Labs  Lab 12/13/23 1855  AMMONIA <13   Coagulation Profile: No results for input(s): INR, PROTIME in the last 168 hours. Cardiac Enzymes: Recent Labs  Lab 12/13/23 1855 12/14/23 0004 12/15/23 0410  CKTOTAL 3,632* 6,351* 3,706*   BNP (last 3 results) No results for input(s): PROBNP in the last 8760 hours. HbA1C: No results for input(s): HGBA1C in the last 72 hours. CBG: No results for input(s): GLUCAP in the last 168 hours. Lipid Profile: No results for input(s): CHOL, HDL, LDLCALC, TRIG, CHOLHDL, LDLDIRECT in the last 72 hours. Thyroid  Function Tests: No results for input(s): TSH, T4TOTAL, FREET4, T3FREE, THYROIDAB in the last 72 hours. Anemia Panel: No results for input(s): VITAMINB12, FOLATE, FERRITIN, TIBC, IRON, RETICCTPCT in the last 72 hours. Sepsis Labs: Recent Labs  Lab 12/14/23 0004 12/14/23 0131  12/14/23 0707 12/14/23 1029  LATICACIDVEN 1.3 5.3* 2.2* 1.7    Recent Results (from the past 240 hours)  Urine Culture     Status: None   Collection Time: 12/13/23  7:30 PM   Specimen: Urine, Clean Catch  Result Value Ref Range Status   Specimen Description   Final    URINE, CLEAN CATCH Performed at [facility], [address] [place], [place] [address]    Special Requests   Final    NONE Performed at [facility], [address] [place], [place] [address]    Culture   Final    NO GROWTH Performed at [facility], [address], [place], [place] [address]    Report Status 12/15/2023 FINAL  Final         Radiology Studies: CT HEAD WO CONTRAST ( ) Result Date: 12/14/2023 CLINICAL DATA:  Initial evaluation for acute trauma, fall. EXAM: CT HEAD WITHOUT  CONTRAST TECHNIQUE: Contiguous axial images were obtained from the base of the skull through the vertex without intravenous contrast. RADIATION DOSE REDUCTION: This exam was performed according to the departmental dose-optimization program which includes automated exposure control, adjustment of the mA and/or kV according to patient size and/or use of iterative reconstruction technique. COMPARISON:  Prior CT from 12/13/2023. FINDINGS: Brain: Mild age-related cerebral atrophy with chronic small vessel ischemic disease. No acute intracranial hemorrhage. No acute large vessel territory infarct. No mass lesion or midline shift. No hydrocephalus or extra-axial fluid collection. Vascular: No abnormal hyperdense vessel. Scattered vascular calcifications noted within the carotid siphons. Skull: New and/or increased soft tissue contusion present at the right supraorbital/periorbital region. Calvarium intact without fracture. Sinuses/Orbits: Globes and orbital soft tissues within normal limits. Mild scattered mucosal thickening present about the ethmoidal air cells and left maxillary sinus. Paranasal sinuses are otherwise clear. Mastoid air cells and middle ear cavities are clear. Other: None. IMPRESSION: 1. No acute intracranial abnormality. 2. New and/or increased soft tissue contusion at the right supraorbital/periorbital region. No calvarial fracture. 3. Mild age-related cerebral atrophy with chronic small vessel ischemic disease. Electronically Signed   By: Morene Hoard M.D.   On: 12/14/2023 03:50   CT Cervical Spine Wo Contrast Result Date: 12/13/2023 EXAM: CT CERVICAL SPINE WITHOUT CONTRAST 12/13/2023 06:43:24 PM TECHNIQUE: CT of the cervical spine was performed without the administration of intravenous contrast. Multiplanar reformatted images are provided for review. Automated exposure control, iterative reconstruction, and/or weight based adjustment of the mA/kV was utilized to reduce the radiation dose to  as low as reasonably achievable. COMPARISON: CT cervical spine 06/03/2023. CLINICAL HISTORY: Ataxia, cervical trauma. Found down on floor. FINDINGS: CERVICAL SPINE: BONES AND ALIGNMENT: Cervical spine straightening. No listhesis. No acute fracture or suspicious lesion. DEGENERATIVE CHANGES: Moderate disc space narrowing with degenerative endplate spurring at C4-C5 and C5-C6. Up to moderate multilevel facet arthrosis. No evidence of high grade spinal canal stenosis. Moderate neural foraminal stenosis on the left at C4-C5 and on the right at C5-C6. SOFT TISSUES: No prevertebral soft tissue swelling. Asymmetrically prominent calcified plaque about the left carotid bifurcation. IMPRESSION: 1. No acute cervical spine fracture or traumatic malalignment. Electronically signed by: Dasie Hamburg MD 12/13/2023 06:58 PM EST RP Workstation: [id number]   CT Head Wo Contrast Result Date: 12/13/2023 EXAM: CT HEAD WITHOUT CONTRAST 12/13/2023 06:43:24 PM TECHNIQUE: CT of the head was performed without the administration of intravenous contrast. Automated exposure control, iterative reconstruction, and/or weight based adjustment of the mA/kV was utilized to reduce the radiation dose to as  low as reasonably achievable. COMPARISON: CT head 07/28/2023. CLINICAL HISTORY: Head trauma, minor (Age >= 65y). Found down on floor. History of dementia. FINDINGS: BRAIN AND VENTRICLES: There is no evidence of an acute infarct, intracranial hemorrhage, mass, midline shift, hydrocephalus, or extra-axial fluid collection. There is mild cerebral atrophy. Cerebral white matter hypodensities are similar to the prior CT and nonspecific but compatible with mild chronic small vessel ischemic disease. ORBITS: No acute abnormality. SINUSES: Minimal mucosal thickening in the paranasal sinuses. Small mucous retention cyst in the left maxillary sinus. Clear mastoid air cells. SOFT TISSUES AND SKULL: Frontal scalp soft tissue swelling. No skull fracture.  IMPRESSION: 1. No acute intracranial abnormality. 2. Mild chronic small vessel ischemic disease. Electronically signed by: Dasie Hamburg MD 12/13/2023 06:53 PM EST RP Workstation: [id number]   DG Chest Port 1 View Result Date: 12/13/2023 EXAM: 1 VIEW(S) XRAY OF THE CHEST 12/13/2023 06:00:02 PM COMPARISON: Comparison to study dated 11/28/2023. CLINICAL HISTORY: fall fall FINDINGS: LINES, TUBES AND DEVICES: Right-sided pacemaker is unchanged compared to the prior study. LUNGS AND PLEURA: No focal pulmonary opacity. No pulmonary edema. No pleural effusion. No pneumothorax. HEART AND MEDIASTINUM: Right-sided pacemaker is unchanged compared to the prior study. No acute abnormality of the cardiac and mediastinal silhouettes. BONES AND SOFT TISSUES: No acute osseous abnormality. IMPRESSION: 1. No acute cardiopulmonary process. Electronically signed by: Lynwood Seip MD 12/13/2023 06:14 PM EST RP Workstation: [id number]   DG Pelvis Portable Result Date: 12/13/2023 EXAM: 1 or 2 VIEW(S) XRAY OF THE PELVIS 12/13/2023 06:00:02 PM COMPARISON: None available. CLINICAL HISTORY: Fall. FINDINGS: BONES AND JOINTS: No acute fracture. No focal osseous lesion. No joint dislocation. SOFT TISSUES: The soft tissues are unremarkable. IMPRESSION: 1. No significant abnormality. Electronically signed by: Lynwood Seip MD 12/13/2023 06:13 PM EST RP Workstation: [id number]        Scheduled Meds:  apixaban   5 mg Oral BID   donepezil   10 mg Oral QHS   feeding supplement  237 mL Oral TID BM   gabapentin   800 mg Oral QID   levETIRAcetam   1,000 mg Oral BID   memantine   10 mg Oral BID   multivitamin with minerals  1 tablet Oral Daily   potassium chloride   40 mEq Oral Q4H   QUEtiapine  75 mg Oral TID   Continuous Infusions:  lactated ringers  150 mL/hr at 12/15/23 0418          Derryl Duval, MD [facility] 12/15/2023, 12:54 PM

## 2023-12-15 NOTE — Plan of Care (Signed)
   Problem: Education: Goal: Knowledge of General Education information will improve Description Including pain rating scale, medication(s)/side effects and non-pharmacologic comfort measures Outcome: Progressing   Problem: Health Behavior/Discharge Planning: Goal: Ability to manage health-related needs will improve Outcome: Progressing

## 2023-12-15 NOTE — Progress Notes (Signed)
   12/15/23 1800  Urine Measurement/Characteristics  Urinary Interventions Bladder scan;Intermittent/Straight cath  Bladder Scan Volume (mL) 734 mL  Intermittent/Straight Cath (mL) 800 mL  Intermittent Catheter Size 14

## 2023-12-15 NOTE — TOC Progression Note (Signed)
 Transition of Care [facility]) - Progression Note    Patient Details  Name: Jonathan Poole MRN: [id number] Date of Birth: 20-Feb-1955  Transition of Care [facility]) CM/SW Contact  Mcarthur Saddie Kim, [place] Phone Number: 12/15/2023, 2:40 PM  Clinical Narrative:  Musu with DSS faxed protective order to LCSW. Protective order now on pt's chart. MD and RN updated that DSS is now guardian. Chart also updated. Musu reports she is working with [facility] to revoke hospice today so pt can go to SNF. SNF referrals sent out. 30 day PASRR received. TOC will follow up in AM.      Expected Discharge Plan: Skilled Nursing Facility Barriers to Discharge: Continued Medical Work up               Expected Discharge Plan and Services In-house Referral: Clinical Social Work     Living arrangements for the past 2 months: Single Family Home                                       Social Drivers of Health (SDOH) Interventions SDOH Screenings   Food Insecurity: Patient Unable To Answer (12/13/2023)  Housing: Patient Unable To Answer (12/13/2023)  Transportation Needs: Patient Unable To Answer (12/13/2023)  Utilities: Patient Unable To Answer (12/13/2023)  Alcohol  Screen: Low Risk  (04/10/2023)  Depression (PHQ2-9): Medium Risk (11/02/2023)  Financial Resource Strain: High Risk (11/18/2023)  Physical Activity: Inactive (04/10/2023)  Social Connections: Unknown (12/14/2023)  Stress: No Stress Concern Present (04/10/2023)  Tobacco Use: Medium Risk (12/13/2023)  Health Literacy: Inadequate Health Literacy (11/16/2023)    Readmission Risk Interventions    12/14/2023    2:40 PM 11/30/2023   12:49 PM 11/29/2023   10:59 AM  Readmission Risk Prevention Plan  Transportation Screening Complete Complete Complete  PCP or Specialist Appt within 5-7 Days   Complete  Home Care Screening  Complete Complete  Medication Review (RN CM)  Complete Complete  HRI or Home Care Consult Complete    Social Work  Consult for Recovery Care Planning/Counseling Complete    Palliative Care Screening Not Applicable    Medication Review Oceanographer) Complete

## 2023-12-15 NOTE — Progress Notes (Signed)
 Bladder scan revealed 734 ml in patient bladder , Dr. Derryl

## 2023-12-15 NOTE — Progress Notes (Signed)
 While attempting to in and out catherize patient he projectile vomited, gave PRN compazine and cleaned patient and changed bed linen , and then in and out catherized patient, he tolerated well, 800cc of amber colored urine returned to bag, patient noted to have blood to his left shoulder , he scratched mole open that was on his neck, applied optifoam

## 2023-12-16 DIAGNOSIS — R296 Repeated falls: Secondary | ICD-10-CM | POA: Diagnosis not present

## 2023-12-16 DIAGNOSIS — G309 Alzheimer's disease, unspecified: Secondary | ICD-10-CM

## 2023-12-16 DIAGNOSIS — N179 Acute kidney failure, unspecified: Secondary | ICD-10-CM | POA: Diagnosis not present

## 2023-12-16 DIAGNOSIS — M6282 Rhabdomyolysis: Secondary | ICD-10-CM | POA: Diagnosis not present

## 2023-12-16 DIAGNOSIS — R338 Other retention of urine: Secondary | ICD-10-CM

## 2023-12-16 DIAGNOSIS — Z950 Presence of cardiac pacemaker: Secondary | ICD-10-CM

## 2023-12-16 DIAGNOSIS — F0280 Dementia in other diseases classified elsewhere without behavioral disturbance: Secondary | ICD-10-CM

## 2023-12-16 LAB — CK: Total CK: 1711 U/L — ABNORMAL HIGH (ref 49–397)

## 2023-12-16 MED ORDER — CHLORHEXIDINE GLUCONATE CLOTH 2 % EX PADS
6.0000 | MEDICATED_PAD | Freq: Every day | CUTANEOUS | Status: DC
  Administered 2023-12-16 – 2023-12-17 (×2): 6 via TOPICAL

## 2023-12-16 MED ORDER — TAMSULOSIN HCL 0.4 MG PO CAPS
0.4000 mg | ORAL_CAPSULE | Freq: Every day | ORAL | Status: DC
  Administered 2023-12-16 – 2023-12-17 (×2): 0.4 mg via ORAL
  Filled 2023-12-16 (×2): qty 1

## 2023-12-16 NOTE — TOC Progression Note (Signed)
 Transition of Care [facility]) - Progression Note    Patient Details  Name: Jonathan Poole MRN: [id number] Date of Birth: 23-Jul-1955  Transition of Care [facility]) CM/SW Contact  Mcarthur Saddie Kim, [place] Phone Number: 12/16/2023, 2:34 PM  Clinical Narrative: TOC confirmed with [facility] that DSS revoked hospice on 11/4. No bed offers in [facility]. Discussed with Musu with DSS who is agreeable to send to [place] or [place]. [employer] business office manager to call Musu. Musu reports she will need to talk to her supervisor as well. [facility] may be able to take pt tomorrow if everything is worked out with finances. TOC will follow.        Expected Discharge Plan: Skilled Nursing Facility Barriers to Discharge: No SNF bed               Expected Discharge Plan and Services In-house Referral: Clinical Social Work     Living arrangements for the past 2 months: Single Family Home                                       Social Drivers of Health (SDOH) Interventions SDOH Screenings   Food Insecurity: Patient Unable To Answer (12/13/2023)  Housing: Patient Unable To Answer (12/13/2023)  Transportation Needs: Patient Unable To Answer (12/13/2023)  Utilities: Patient Unable To Answer (12/13/2023)  Alcohol  Screen: Low Risk  (04/10/2023)  Depression (PHQ2-9): Medium Risk (11/02/2023)  Financial Resource Strain: High Risk (11/18/2023)  Physical Activity: Inactive (04/10/2023)  Social Connections: Unknown (12/14/2023)  Stress: No Stress Concern Present (04/10/2023)  Tobacco Use: Medium Risk (12/13/2023)  Health Literacy: Inadequate Health Literacy (11/16/2023)    Readmission Risk Interventions    12/14/2023    2:40 PM 11/30/2023   12:49 PM 11/29/2023   10:59 AM  Readmission Risk Prevention Plan  Transportation Screening Complete Complete Complete  PCP or Specialist Appt within 5-7 Days   Complete  Home Care Screening  Complete Complete  Medication Review  (RN CM)  Complete Complete  HRI or Home Care Consult Complete    Social Work Consult for Recovery Care Planning/Counseling Complete    Palliative Care Screening Not Applicable    Medication Review Oceanographer) Complete

## 2023-12-16 NOTE — Progress Notes (Signed)
 Patient was retaining 669 on bladder scan this morning. Obtained an Order for In and Out cath. Patient put out 1000 of amber and bloody urine in cath. Patient showed some bleeding from the urethra tip and blood was present in the cath.

## 2023-12-16 NOTE — Progress Notes (Signed)
 PROGRESS NOTE    Jonathan Poole  FMW:[id number] DOB: 09/04/1955 DOA: 12/13/2023 PCP: [name] [name], MD   Brief Narrative:   Jonathan Poole is a 68 y.o. male with medical history significant of atrial fibrillation, hyperlipidemia, seizures, dementia who presents to the emergency department from home via EMS due to fall sustained at home.  At bedside, patient was unable to provide history possibly due to underlying dementia, history was obtained from ED PA and ED medical record.  Per report, EMS was activated and patient was found lying in his bathroom floor for an unknown period of time.  Living situation was brought per EMS report and he sustained abrasions to his forehead without any other complaints.   ED course In the emergency department, he was hemodynamically stable.  Workup in the ED showed normocytic anemia.  BMP was significant for sodium of 146, blood glucose 122, BUN 32, creatinine 1.86 (baseline creatinine at 0.7-0.9), total CK 3,632, troponin 27 > 28, lactic acid 2.9 > 2.1.,  Magnesium 2.0. CT head without contrast showed no acute rectal abnormality CT cervical spine without contrast showed no acute cervical spine fracture or traumatic malalignment Pelvic x-ray showed no significant normality and chest x-ray showed no acute cardiopulmonary process  Patient admitted for rhabdomyolysis/multiple falls  Assessment & Plan:   Principal Problem:   Rhabdomyolysis Active Problems:   AKI (acute kidney injury)   Alzheimer's disease (HCC)   Chronic atrial fibrillation (HCC)   H/O cardiac pacemaker   Recurrent falls   Complex partial epileptic seizure (HCC)   Rhabdomyolysis -Total CK 3,632-->6000->3500 -Continue to maintain adequate hydration - Patient CK levels in the 1000 range currently. - Adequate oral intake has been encouraged.  right forehead laceration:  -Suffered after a fall in the hospital -Sutured and is stable.  Anion gap metabolic acidosis secondary to  dehydration: Resolved  Lactic acidosis due to rhabdomyolysis and dehydration -Continue to maintain adequate hydration -Lactic acid and CK levels adequately improving. - Follow clinical response.   Hypernatremia due to dehydration -Na 146 continue adequate oral hydration - Follow electrolytes intermittently.   Elevated troponin possibly due to type II demand ischemia Troponin 27 > 28; patient denies chest pain  Prolonged QT interval QTc 509 ms Avoid QT prolonging drugs Magnesium level was 2.0 Will repeat EKG to ensure normal QTc   Fall at home -Patient has a history of recurrent falls -Continue fall precaution -Planning for SNF placement after evaluation by PT..  Dementia -Continue Aricept  and Namenda , multivitamins and nutritional supplements.  -Continue delirium precaution and constant reorientation   Chronic atrial fibrillation -Continue Eliquis  -Rate controlled - At baseline no using any rate controlling agent.   Seizure disorder -Continue Keppra  -No seizure appreciated.  Acute urinary retention - Flomax has been started - Foley catheter inserted - Follow response.      Advance Care Planning: DNR   Consults: None  Disposition: Patient from home.  Apparently was under hospice at home with [facility].  Planning for SNF.   Family Communication: None at bedside   Severity of Illness: The appropriate patient status for this patient is INPATIENT. Inpatient status is judged to be reasonable and necessary in order to provide the required intensity of service to ensure the patient's safety. The patient's presenting symptoms, physical exam findings, and initial radiographic and laboratory data in the context of their chronic comorbidities is felt to place them at high risk for further clinical deterioration. Furthermore, it is not anticipated that the patient will be  medically stable for discharge from the hospital within 2 midnights of admission.    * I certify that at  the point of admission it is my clinical judgment that the patient will require inpatient hospital care spanning beyond 2 midnights from the point of admission due to high intensity of service, high risk for further deterioration and high frequency of surveillance required.*     Antimicrobials:    Subjective: Generally weak/deconditioned.  Afebrile and currently demonstrating acute urinary retention.  No chest pain, no nausea, no vomiting.  Adequately following commands.  Not requiring any sitter.  objective: Vitals:   12/15/23 2032 12/16/23 0613 12/16/23 0929 12/16/23 1333  BP: 102/69 133/81 (!) 144/97 (!) 142/76  Pulse: 70 66 72 70  Resp:   16   Temp: 97.8 F (36.6 C) 98.3 F (36.8 C)    TempSrc: Axillary Oral    SpO2: 98% 98% 96% 96%  Weight:      Height:        Intake/Output Summary (Last 24 hours) at 12/16/2023 1833 Last data filed at 12/16/2023 1752 Gross per 24 hour  Intake 480 ml  Output 2350 ml  Net -1870 ml   Filed Weights   12/13/23 1746 12/13/23 2208  Weight: 71 kg 61 kg    Examination: General exam: Alert, awake, oriented x 2; following commands appropriately and in no acute distress.  Per nursing staff demonstrating ongoing urinary retention. Respiratory system: Clear to auscultation. Respiratory effort normal.  Good saturation on room air. Cardiovascular system:RRR.  No rubs, no gallops, no JVD. Gastrointestinal system: Abdomen is nondistended, soft and nontender. No organomegaly or masses felt. Normal bowel sounds heard. Central nervous system: Moving 4 limbs spontaneously.  No focal neurological deficits. Extremities: No C/C/E, +pedal pulses Skin: No petechiae; abrasion on his right forehead appreciated. Psychiatry: Judgement and insight appear normal.  Flat affect appreciated on exam.  Data Reviewed: I have personally reviewed following labs and imaging studies  CBC: Recent Labs  Lab 12/13/23 1855 12/14/23 0430  WBC 6.7 5.6  NEUTROABS 5.8  --    HGB 12.0* 10.6*  HCT 36.4* 31.7*  MCV 88.1 88.3  PLT 160 171   Basic Metabolic Panel: Recent Labs  Lab 12/13/23 1855 12/14/23 0430 12/15/23 0410  NA 146* 145 146*  K 4.2 3.9 3.1*  CL 99 102 104  CO2 25 27 35*  GLUCOSE 122* 103* 93  BUN 32* 31* 22  CREATININE 1.86* 1.29* 0.92  CALCIUM  9.6 8.9 8.9  MG 2.0 1.9  --   PHOS  --  3.5  --    GFR: Estimated Creatinine Clearance: 66.3 mL/min (by C-G formula based on SCr of 0.92 mg/dL).  Liver Function Tests: Recent Labs  Lab 12/13/23 1855 12/14/23 0430  AST 86* 154*  ALT 25 34  ALKPHOS 66 56  BILITOT 0.7 0.5  PROT 6.7 5.6*  ALBUMIN 4.2 3.6    Recent Labs  Lab 12/13/23 1855  AMMONIA <13   Cardiac Enzymes: Recent Labs  Lab 12/13/23 1855 12/14/23 0004 12/15/23 0410 12/16/23 0510  CKTOTAL 3,632* 6,351* 3,706* 1,711*   Sepsis Labs: Recent Labs  Lab 12/14/23 0004 12/14/23 0131 12/14/23 0707 12/14/23 1029  LATICACIDVEN 1.3 5.3* 2.2* 1.7    Recent Results (from the past 240 hours)  Urine Culture     Status: None   Collection Time: 12/13/23  7:30 PM   Specimen: Urine, Clean Catch  Result Value Ref Range Status   Specimen Description   Final  URINE, CLEAN CATCH Performed at [facility], [address], [place], [place] [address]    Special Requests   Final    NONE Performed at [facility], [address] [place], [place] [address]    Culture   Final    NO GROWTH Performed at [facility], [address] [place], [place] [address]    Report Status 12/15/2023 FINAL  Final     Scheduled Meds:  apixaban   5 mg Oral BID   Chlorhexidine  Gluconate Cloth  6 each Topical Daily   donepezil   10 mg Oral QHS   feeding supplement  237 mL Oral TID BM   gabapentin   800 mg Oral QID   levETIRAcetam   1,000 mg Oral BID   memantine   10 mg Oral BID   multivitamin with minerals  1 tablet Oral Daily   QUEtiapine  75 mg Oral TID   tamsulosin  0.4 mg Oral QPC supper   Continuous Infusions:  lactated ringers   100 mL/hr at 12/16/23 1251    Eric Nunnery, MD [facility] 12/16/2023, 6:33 PM

## 2023-12-16 NOTE — Plan of Care (Signed)
   Problem: Education: Goal: Knowledge of General Education information will improve Description Including pain rating scale, medication(s)/side effects and non-pharmacologic comfort measures Outcome: Progressing   Problem: Health Behavior/Discharge Planning: Goal: Ability to manage health-related needs will improve Outcome: Progressing

## 2023-12-17 DIAGNOSIS — N179 Acute kidney failure, unspecified: Secondary | ICD-10-CM | POA: Diagnosis not present

## 2023-12-17 DIAGNOSIS — R296 Repeated falls: Secondary | ICD-10-CM | POA: Diagnosis not present

## 2023-12-17 DIAGNOSIS — E860 Dehydration: Secondary | ICD-10-CM

## 2023-12-17 DIAGNOSIS — G309 Alzheimer's disease, unspecified: Secondary | ICD-10-CM | POA: Diagnosis not present

## 2023-12-17 DIAGNOSIS — T796XXD Traumatic ischemia of muscle, subsequent encounter: Secondary | ICD-10-CM

## 2023-12-17 DIAGNOSIS — G40209 Localization-related (focal) (partial) symptomatic epilepsy and epileptic syndromes with complex partial seizures, not intractable, without status epilepticus: Secondary | ICD-10-CM

## 2023-12-17 MED ORDER — TAMSULOSIN HCL 0.4 MG PO CAPS: 0.4000 mg | ORAL_CAPSULE | Freq: Every day | ORAL | 0 refills | Status: AC

## 2023-12-17 MED ORDER — LORAZEPAM 2 MG/ML PO CONC: 2.0000 mg | Freq: Four times a day (QID) | ORAL | 0 refills | Status: AC | PRN

## 2023-12-17 MED ORDER — MORPHINE SULFATE (CONCENTRATE) 10 MG /0.5 ML PO SOLN: 10.0000 mg | Freq: Four times a day (QID) | ORAL | 0 refills | Status: AC | PRN

## 2023-12-17 NOTE — TOC Transition Note (Addendum)
 Transition of Care [facility]) - Discharge Note   Patient Details  Name: Jonathan Poole MRN: [id number] Date of Birth: 1955-03-19  [facility] CM/SW Contact:  Lucie Lunger, LCSWA Phone Number: 12/17/2023, 3:13 PM  Clinical Narrative:    CSW updated that pt is medically stable for D/C today to [facility] rehab. CSW spoke to [place] with SNF who states they are ready for pt to arrive. CSW updated [place] pts LG who states she is agreeable with plan for D/C today. CSW provided RN with room and report numbers. CSW spoke to RN to confirm if pt needs EMS or [facility] transport, RN states EMS is needed. Med necessity printed to floor for RN. EMS called for transport. TOC signing off.   Final next level of care: Skilled Nursing Facility Barriers to Discharge: Barriers Resolved   Patient Goals and CMS Choice Patient states their goals for this hospitalization and ongoing recovery are:: go to SNF CMS Medicare.gov Compare Post Acute Care list provided to:: Legal Guardian Choice offered to / list presented to : [facility] POA / Guardian [place] ownership interest in [facility].provided to:: [facility] POA / Guardian    Discharge Placement                Patient to be transferred to facility by: EMS Name of family member notified: LG Musu Patient and family notified of of transfer: 12/17/23  Discharge Plan and Services Additional resources added to the After Visit Summary for   In-house Referral: Clinical Social Work                                   Social Drivers of Health (SDOH) Interventions SDOH Screenings   Food Insecurity: Patient Unable To Answer (12/13/2023)  Housing: Patient Unable To Answer (12/13/2023)  Transportation Needs: Patient Unable To Answer (12/13/2023)  Utilities: Patient Unable To Answer (12/13/2023)  Alcohol  Screen: Low Risk  (04/10/2023)  Depression (PHQ2-9): Medium Risk (11/02/2023)  Financial Resource Strain: High Risk (11/18/2023)  Physical  Activity: Inactive (04/10/2023)  Social Connections: Unknown (12/14/2023)  Stress: No Stress Concern Present (04/10/2023)  Tobacco Use: Medium Risk (12/13/2023)  Health Literacy: Inadequate Health Literacy (11/16/2023)     Readmission Risk Interventions    12/14/2023    2:40 PM 11/30/2023   12:49 PM 11/29/2023   10:59 AM  Readmission Risk Prevention Plan  Transportation Screening Complete Complete Complete  PCP or Specialist Appt within 5-7 Days   Complete  Home Care Screening  Complete Complete  Medication Review (RN CM)  Complete Complete  HRI or Home Care Consult Complete    Social Work Consult for Recovery Care Planning/Counseling Complete    Palliative Care Screening Not Applicable    Medication Review Oceanographer) Complete

## 2023-12-17 NOTE — Discharge Summary (Signed)
 Physician Discharge Summary   Patient: Jonathan Poole MRN: [id number] DOB: July 26, 1955  Admit date:     12/13/2023  Discharge date: 12/17/23  Discharge Physician: Eric Nunnery   PCP: [name] [name], MD   Recommendations at discharge:  Repeat basic metabolic panel to follow electrolytes and renal function Continue further discussion regarding goals of care and advance care planning. Make sure patient has follow-up with urology service if voiding trial has failed and continue Foley catheter usage has been required.   Discharge Diagnoses: Principal Problem:   Rhabdomyolysis Active Problems:   AKI (acute kidney injury)   Alzheimer's disease (HCC)   Chronic atrial fibrillation (HCC)   H/O cardiac pacemaker   Recurrent falls   Complex partial epileptic seizure (HCC)   Dehydration Severe protein calorie malnutrition  Brief Narrative:    [name] is a 68 y.o. male with medical history significant of atrial fibrillation, hyperlipidemia, seizures, dementia who presents to the emergency department from home via EMS due to fall sustained at home.  At bedside, patient was unable to provide history possibly due to underlying dementia, history was obtained from ED PA and ED medical record.  Per report, EMS was activated and patient was found lying in his bathroom floor for an unknown period of time.  Living situation was brought per EMS report and he sustained abrasions to his forehead without any other complaints.   ED course In the emergency department, he was hemodynamically stable.  Workup in the ED showed normocytic anemia.  BMP was significant for sodium of 146, blood glucose 122, BUN 32, creatinine 1.86 (baseline creatinine at 0.7-0.9), total CK 3,632, troponin 27 > 28, lactic acid 2.9 > 2.1.,  Magnesium 2.0. CT head without contrast showed no acute rectal abnormality CT cervical spine without contrast showed no acute cervical spine fracture or traumatic malalignment Pelvic  x-ray showed no significant normality and chest x-ray showed no acute cardiopulmonary process   Patient admitted for rhabdomyolysis/multiple falls  Assessment and Plan: Rhabdomyolysis -Total CK 3,632-->6000->3500 -Continue to maintain adequate hydration - Patient CK levels in the 1000 range currently. - Adequate oral intake has been encouraged.   right forehead laceration:  -Suffered after a fall in the hospital -Sutured and is stable.   Anion gap metabolic acidosis secondary to dehydration: Resolved -Patient advised to maintain adequate hydration.   Lactic acidosis due to rhabdomyolysis and dehydration -Continue to maintain adequate hydration -Lactic acid and CK levels adequately improving/resolving. - Follow clinical response.   Hypernatremia due to dehydration -Na 146 continue adequate oral hydration - Follow electrolytes intermittently.    Elevated troponin possibly due to type II demand ischemia Troponin 27 > 28; patient denies chest pain and demonstrated no shortness of breath.   Prolonged QT interval -QTc 509 ms -Avoid QT prolonging drugs -Continue to follow electrolytes stability (magnesium above 2 and potassium above 4 as main goal)   Fall at home -Patient has a history of recurrent falls -Continue fall precaution -Planning for SNF placement after evaluation by PT..   Dementia -Continue Aricept  and Namenda , multivitamins and nutritional supplements.  -Continue delirium precaution and constant reorientation -Continue supportive care.   Chronic atrial fibrillation -Continue Eliquis  -Rate controlled - At baseline no using any rate controlling agent.   Seizure disorder -Continue Keppra  -No seizure appreciated.   Acute urinary retention - Flomax has been started - Foley catheter inserted - Follow response and if patient failed voiding trial will require outpatient follow-up with urology in the next 10 days  or so.  Severe protein calorie malnutrition -  Continue feeding supplements - Maintain adequate hydration - Continue dysphagia 2 diet with thin liquids. -Body mass index is 18.24 kg/m.    Consultants: Urology service curbside. Procedures performed: See below for x-ray reports. Disposition: Skilled nursing facility Diet recommendation: Dysphagia 3 diet with thin liquids; heart healthy/low-sodium with medications given in applesauce.  DISCHARGE MEDICATION: Allergies as of 12/17/2023   No Known Allergies      Medication List     STOP taking these medications    cefdinir 300 MG capsule Commonly known as: OMNICEF   Ibuprofen 200 MG Caps   Rexulti  3 MG Tabs Generic drug: Brexpiprazole    rosuvastatin  20 MG tablet Commonly known as: CRESTOR    tiZANidine  4 MG tablet Commonly known as: ZANAFLEX        TAKE these medications    apixaban  5 MG Tabs tablet Commonly known as: Eliquis  Take 1 tablet (5 mg total) by mouth 2 (two) times daily. NEEDS CARDIOLOGY APPT, CALL OFFICE [phone].  THANK YOU   cyanocobalamin  1000 MCG/ML injection Commonly known as: VITAMIN B12 Inject 1,000 mcg into the muscle every 30 (thirty) days.   donepezil  10 MG tablet Commonly known as: ARICEPT  TAKE 1 TABLET AT BEDTIME   feeding supplement Liqd Take 237 mLs by mouth 3 (three) times daily between meals.   gabapentin  800 MG tablet Commonly known as: NEURONTIN  Take 800 mg by mouth 4 (four) times daily.   levETIRAcetam  1000 MG tablet Commonly known as: Keppra  Take 1 tablet (1,000 mg total) by mouth 2 (two) times daily.   LORazepam  2 MG/ML concentrated solution Commonly known as: ATIVAN  Take 1 mL (2 mg total) by mouth every 6 (six) hours as needed for anxiety, seizure or sleep. What changed:  when to take this reasons to take this   memantine  10 MG tablet Commonly known as: NAMENDA  Take 10 mg by mouth in the morning and at bedtime.   morphine CONCENTRATE 10 mg / 0.5 ml concentrated solution Take 0.5-1 mLs (10-20 mg total) by  mouth every 6 (six) hours as needed for severe pain (pain score 7-10) (shortness of breath). Can take 20mg  for severe agitation What changed: when to take this   multivitamin with minerals Tabs tablet Take 1 tablet by mouth daily.   QUEtiapine 25 MG tablet Commonly known as: SEROQUEL Take 75 mg by mouth 3 (three) times daily.   sennosides-docusate sodium 8.6-50 MG tablet Commonly known as: SENOKOT-S Take 1 tablet by mouth See admin instructions. Three times daily until bowel movement   tamsulosin 0.4 MG Caps capsule Commonly known as: FLOMAX Take 1 capsule (0.4 mg total) by mouth daily after supper.   Thiamine  Mononitrate 250 MG Tabs Take 500 mg by mouth daily.   Zinc Oxide 12.8 % ointment Commonly known as: TRIPLE PASTE Apply 1 Application topically as needed for irritation (when chaning diaper- or when irritation is bad).        Follow-up Information     [name] [name], MD. Schedule an appointment as soon as possible for a visit in 10 day(s).   Specialty: Internal Medicine Why: After discharge from the skilled nursing facility. Contact information: [address] [name] [facility] [address] [phone]                Discharge Exam: Filed Weights   12/13/23 1746 12/13/23 2208  Weight: 71 kg 61 kg   General exam: Alert, awake, oriented x 1-2 intermittently; following commands appropriately and in  no acute distress.  More interactive and no complaining of suprapubic tenderness after Foley catheter has been placed.  Good urinary output appreciated without hematuria. Respiratory system: Clear to auscultation. Respiratory effort normal.  Good saturation on room air. Cardiovascular system:RRR.  No rubs, no gallops, no JVD. Gastrointestinal system: Abdomen is nondistended, soft and nontender. No organomegaly or masses felt. Normal bowel sounds heard. Central nervous system: Moving 4 limbs spontaneously.  No focal neurological deficits. Extremities: No C/C/E, +pedal  pulses Skin: No petechiae; abrasion on his right forehead appreciated. Psychiatry: Judgement and insight appear normal.  Flat affect appreciated on exam.  Condition at discharge: good  The results of significant diagnostics from this hospitalization (including imaging, microbiology, ancillary and laboratory) are listed below for reference.   Imaging Studies: CT HEAD WO CONTRAST ( ) Result Date: 12/14/2023 CLINICAL DATA:  Initial evaluation for acute trauma, fall. EXAM: CT HEAD WITHOUT CONTRAST TECHNIQUE: Contiguous axial images were obtained from the base of the skull through the vertex without intravenous contrast. RADIATION DOSE REDUCTION: This exam was performed according to the departmental dose-optimization program which includes automated exposure control, adjustment of the mA and/or kV according to patient size and/or use of iterative reconstruction technique. COMPARISON:  Prior CT from 12/13/2023. FINDINGS: Brain: Mild age-related cerebral atrophy with chronic small vessel ischemic disease. No acute intracranial hemorrhage. No acute large vessel territory infarct. No mass lesion or midline shift. No hydrocephalus or extra-axial fluid collection. Vascular: No abnormal hyperdense vessel. Scattered vascular calcifications noted within the carotid siphons. Skull: New and/or increased soft tissue contusion present at the right supraorbital/periorbital region. Calvarium intact without fracture. Sinuses/Orbits: Globes and orbital soft tissues within normal limits. Mild scattered mucosal thickening present about the ethmoidal air cells and left maxillary sinus. Paranasal sinuses are otherwise clear. Mastoid air cells and middle ear cavities are clear. Other: None. IMPRESSION: 1. No acute intracranial abnormality. 2. New and/or increased soft tissue contusion at the right supraorbital/periorbital region. No calvarial fracture. 3. Mild age-related cerebral atrophy with chronic small vessel ischemic  disease. Electronically Signed   By: Morene Hoard M.D.   On: 12/14/2023 03:50   CT Cervical Spine Wo Contrast Result Date: 12/13/2023 EXAM: CT CERVICAL SPINE WITHOUT CONTRAST 12/13/2023 06:43:24 PM TECHNIQUE: CT of the cervical spine was performed without the administration of intravenous contrast. Multiplanar reformatted images are provided for review. Automated exposure control, iterative reconstruction, and/or weight based adjustment of the mA/kV was utilized to reduce the radiation dose to as low as reasonably achievable. COMPARISON: CT cervical spine 06/03/2023. CLINICAL HISTORY: Ataxia, cervical trauma. Found down on floor. FINDINGS: CERVICAL SPINE: BONES AND ALIGNMENT: Cervical spine straightening. No listhesis. No acute fracture or suspicious lesion. DEGENERATIVE CHANGES: Moderate disc space narrowing with degenerative endplate spurring at C4-C5 and C5-C6. Up to moderate multilevel facet arthrosis. No evidence of high grade spinal canal stenosis. Moderate neural foraminal stenosis on the left at C4-C5 and on the right at C5-C6. SOFT TISSUES: No prevertebral soft tissue swelling. Asymmetrically prominent calcified plaque about the left carotid bifurcation. IMPRESSION: 1. No acute cervical spine fracture or traumatic malalignment. Electronically signed by: Dasie Hamburg MD 12/13/2023 06:58 PM EST RP Workstation: [id number]   CT Head Wo Contrast Result Date: 12/13/2023 EXAM: CT HEAD WITHOUT CONTRAST 12/13/2023 06:43:24 PM TECHNIQUE: CT of the head was performed without the administration of intravenous contrast. Automated exposure control, iterative reconstruction, and/or weight based adjustment of the mA/kV was utilized to reduce the radiation dose to as low as reasonably achievable. COMPARISON: CT head 07/28/2023.  CLINICAL HISTORY: Head trauma, minor (Age >= 65y). Found down on floor. History of dementia. FINDINGS: BRAIN AND VENTRICLES: There is no evidence of an acute infarct, intracranial  hemorrhage, mass, midline shift, hydrocephalus, or extra-axial fluid collection. There is mild cerebral atrophy. Cerebral white matter hypodensities are similar to the prior CT and nonspecific but compatible with mild chronic small vessel ischemic disease. ORBITS: No acute abnormality. SINUSES: Minimal mucosal thickening in the paranasal sinuses. Small mucous retention cyst in the left maxillary sinus. Clear mastoid air cells. SOFT TISSUES AND SKULL: Frontal scalp soft tissue swelling. No skull fracture. IMPRESSION: 1. No acute intracranial abnormality. 2. Mild chronic small vessel ischemic disease. Electronically signed by: Dasie Hamburg MD 12/13/2023 06:53 PM EST RP Workstation: [id number]   DG Chest Port 1 View Result Date: 12/13/2023 EXAM: 1 VIEW(S) XRAY OF THE CHEST 12/13/2023 06:00:02 PM COMPARISON: Comparison to study dated 11/28/2023. CLINICAL HISTORY: fall fall FINDINGS: LINES, TUBES AND DEVICES: Right-sided pacemaker is unchanged compared to the prior study. LUNGS AND PLEURA: No focal pulmonary opacity. No pulmonary edema. No pleural effusion. No pneumothorax. HEART AND MEDIASTINUM: Right-sided pacemaker is unchanged compared to the prior study. No acute abnormality of the cardiac and mediastinal silhouettes. BONES AND SOFT TISSUES: No acute osseous abnormality. IMPRESSION: 1. No acute cardiopulmonary process. Electronically signed by: Lynwood Seip MD 12/13/2023 06:14 PM EST RP Workstation: [id number]   DG Pelvis Portable Result Date: 12/13/2023 EXAM: 1 or 2 VIEW(S) XRAY OF THE PELVIS 12/13/2023 06:00:02 PM COMPARISON: None available. CLINICAL HISTORY: Fall. FINDINGS: BONES AND JOINTS: No acute fracture. No focal osseous lesion. No joint dislocation. SOFT TISSUES: The soft tissues are unremarkable. IMPRESSION: 1. No significant abnormality. Electronically signed by: Lynwood Seip MD 12/13/2023 06:13 PM EST RP Workstation: [id number]   DG Chest 1 View Result Date: 11/28/2023 EXAM: 1 VIEW XRAY OF THE  CHEST 11/28/2023 08:43:00 PM COMPARISON: Chest x-ray 07/28/2023. CLINICAL HISTORY: 89968 Cough 10031. Pt bib EMS from home after wife reports decline in general health. Pt with advanced alzheimer's and per wife, pt stopped eating and drinking and taking his medications 4 days ago. Wife also reported to EMS that pt has had several falls over the last 2 ; weeks as well. EDP at bedside. FINDINGS: LINES, TUBES AND DEVICES: Right chest wall 2 lead dialysis catheter. LUNGS AND PLEURA: Chronic coarsening interstitial markings. No overt pulmonary edema. Question developing patchy airspace opacity at the right lower lung zone. No pleural effusion. No pneumothorax. HEART AND MEDIASTINUM: Atherosclerotic plaque. No acute abnormality of the cardiac and mediastinal silhouettes. BONES AND SOFT TISSUES: No acute osseous abnormality. IMPRESSION: 1. Questionable developing patchy airspace opacity at the right lower lung zone. Recommend repeat chest x-ray PA and lateral view for further evaluation. Electronically signed by: Morgane Naveau MD 11/28/2023 08:55 PM EDT RP Workstation: [id number]    Microbiology: Results for orders placed or performed during the hospital encounter of 12/13/23  Urine Culture     Status: None   Collection Time: 12/13/23  7:30 PM   Specimen: Urine, Clean Catch  Result Value Ref Range Status   Specimen Description   Final    URINE, CLEAN CATCH Performed at [facility], [address] [place], [place] [address]    Special Requests   Final    NONE Performed at [facility], [address], [place], [place] [address]    Culture   Final    NO GROWTH Performed at [facility], [address] [place], [place] [address]    Report  Status 12/15/2023 FINAL  Final    Labs: CBC: Recent Labs  Lab 12/13/23 1855 12/14/23 0430  WBC 6.7 5.6  NEUTROABS 5.8  --   HGB 12.0* 10.6*  HCT 36.4* 31.7*  MCV 88.1 88.3  PLT 160 171   Basic Metabolic Panel: Recent Labs  Lab 12/13/23 1855  12/14/23 0430 12/15/23 0410  NA 146* 145 146*  K 4.2 3.9 3.1*  CL 99 102 104  CO2 25 27 35*  GLUCOSE 122* 103* 93  BUN 32* 31* 22  CREATININE 1.86* 1.29* 0.92  CALCIUM  9.6 8.9 8.9  MG 2.0 1.9  --   PHOS  --  3.5  --    Liver Function Tests: Recent Labs  Lab 12/13/23 1855 12/14/23 0430  AST 86* 154*  ALT 25 34  ALKPHOS 66 56  BILITOT 0.7 0.5  PROT 6.7 5.6*  ALBUMIN 4.2 3.6   CBG: No results for input(s): GLUCAP in the last 168 hours.  Discharge time spent:  35 minutes.  Signed: Eric Nunnery, MD [facility] 12/17/2023

## 2023-12-17 NOTE — Progress Notes (Signed)
 Report called to Niels, LPN at [facility] rehab, patients IV and telemetry have been removed, awaiting EMS transport

## 2023-12-17 NOTE — Progress Notes (Addendum)
 Patient discharged to [facility], transported by EMS. Signed DNR sent with EMS. Discharge summary placed in discharge packet to give to facility. Patient given scheduled night time meds prior to discharge per CN give, facility made aware. Attempted to call legal guardian at 2036 due to patient being transported to facility unable to reach. Hardscript in packet and foley catheter still in place for EMS to transport.

## 2023-12-17 NOTE — Plan of Care (Signed)
   Problem: Education: Goal: Knowledge of General Education information will improve Description Including pain rating scale, medication(s)/side effects and non-pharmacologic comfort measures Outcome: Progressing   Problem: Health Behavior/Discharge Planning: Goal: Ability to manage health-related needs will improve Outcome: Progressing

## 2023-12-17 NOTE — Progress Notes (Signed)
 Patient more alert this shift and able to feed self with set up only,. He is alert and oriented x 1 , foley remains intact and draining at this time,

## 2023-12-22 ENCOUNTER — Telehealth: Payer: Self-pay

## 2023-12-22 NOTE — Telephone Encounter (Signed)
 [facility] called needing to make an appointment for a failed voiding hospital follow up for patient.    Please advise.  Call:  [phone] [facility]  [phone] - Facility

## 2023-12-22 NOTE — Telephone Encounter (Signed)
 Florence Childes to speak with nurse. Moniqua, RN was made aware pt will not be seen until 02/2024 and pt cath need to be changed monthly. Moniqua state's there is an order in for monthly cath change. Pt scheduled

## 2023-12-25 ENCOUNTER — Telehealth: Payer: Self-pay

## 2023-12-30 DIAGNOSIS — F03918 Unspecified dementia, unspecified severity, with other behavioral disturbance: Secondary | ICD-10-CM | POA: Diagnosis not present

## 2023-12-30 DIAGNOSIS — R569 Unspecified convulsions: Secondary | ICD-10-CM | POA: Diagnosis not present

## 2024-02-16 NOTE — Progress Notes (Signed)
" °  Cardiology Office Note   Date: 02/19/24 ID:  Jonathan Poole, DOB 27-Jun-1955, MRN [id number] PCP: [name] [name], MD  [facility] Providers Cardiologist:  None Electrophysiologist:  Donnice DELENA Primus, MD   History of Present Illness Jonathan Poole is a 69 y.o. male with SND s/p Abbott DC PPM, paroxysmal AF, HLD, seizures and early onset Alzheimer's dementia who presents for device management.  Discussed the use of AI scribe software for clinical note transcription with the patient, who gave verbal consent to proceed. History of Present Illness Jonathan Poole is a 69 year old male with a pacemaker who presents for a device check and programming adjustment.  He is on Pradaxa  (dabigatran ) as a long-term anticoagulant to reduce the risk of stroke associated with atrial fibrillation. The medication is to be continued indefinitely.  He resides in a facility and rarely needs assistance with daily activities. He has family living in [place] .  ROS: no complaints  Studies Reviewed  ECG review 12/13/23: NSR 86, PR 159, QRS 149, QT/c 425/509  07/28/23: NSR 74, PR 197, QRS 97, QT/c 434/482  Risk Assessment/Calculations  CHA2DS2-VASc Score = 1  This indicates a 0.6% annual risk of stroke. The patient's score is based upon: CHF History: 0 HTN History: 0 Diabetes History: 0 Stroke History: 0 Vascular Disease History: 0 Age Score: 1 Gender Score: 0  Physical Exam VS:  Ht 6' (1.829 m)   BMI 18.24 kg/m  BP 101/63, P 74.   Wt Readings from Last 3 Encounters:  12/13/23 134 lb 7.7 oz (61 kg)  11/28/23 156 lb 15.5 oz (71.2 kg)  08/20/23 156 lb 15.5 oz (71.2 kg)    GEN: Well nourished, well developed in no acute distress NECK: No JVD; No carotid bruits CARDIAC: RRR, no murmurs, rubs, gallops RESPIRATORY:  Clear to auscultation without rales, wheezing or rhonchi  ABDOMEN: Soft, non-tender, non-distended EXTREMITIES:  No edema; No deformity  SKIN: RIGHT  infraclavicular incision well healed, no erosion or erythema, LEFT infraclavicular scar from prior device implant   Device Information  PPM: Abbott 2272 Assurity MRI, SN [id number], DOI 01/02/20 RA: SJM 2088TC Tendril STS, SN [id number], DOI 07/03/08 RV: SJM 1688TC Tendril SDX, SN [id number], DOI 07/03/08   ASSESSMENT AND PLAN Jonathan Poole is a 69 y.o. male with SND s/p Abbott DC PPM, paroxysmal AF, HLD, seizures and early onset Alzheimer's dementia who presents for device management. Assessment & Plan Cardiac pacemaker management Pacemaker programming adjusted, device functioning well, adequate battery life. - Continue current pacemaker settings with adjusted programming. - multiple episodes of PMT, extended PVARP, increased max track rate, shortened AV intervals with improvement   Atrial fibrillation Chronic atrial fibrillation managed with dabigatran , no acute changes. Brief episodes of pAF, longest duration 3 hours.  - Continue dabigatran  for stroke prevention. On Eliquis  in our system but dabigatran  prescribed at facility, switching over in our sytem, continue Dabigatran .   Dispo: RTC 2 years  Signed, Donnice DELENA Primus, MD  "

## 2024-02-19 ENCOUNTER — Encounter: Payer: Self-pay | Admitting: Student in an Organized Health Care Education/Training Program

## 2024-02-19 ENCOUNTER — Telehealth: Payer: Self-pay

## 2024-02-19 ENCOUNTER — Ambulatory Visit
Attending: Student in an Organized Health Care Education/Training Program | Admitting: Student in an Organized Health Care Education/Training Program

## 2024-02-19 VITALS — Ht 72.0 in

## 2024-02-19 DIAGNOSIS — I480 Paroxysmal atrial fibrillation: Secondary | ICD-10-CM

## 2024-02-19 DIAGNOSIS — I495 Sick sinus syndrome: Secondary | ICD-10-CM

## 2024-02-19 LAB — CUP PACEART INCLINIC DEVICE CHECK
Battery Remaining Longevity: 74 mo
Battery Voltage: 2.99 V
Brady Statistic RA Percent Paced: 90 %
Brady Statistic RV Percent Paced: 1 %
Date Time Interrogation Session: 20260109101354
Implantable Lead Connection Status: 753985
Implantable Lead Connection Status: 753985
Implantable Lead Implant Date: 20100504
Implantable Lead Implant Date: 20100524
Implantable Lead Location: 753859
Implantable Lead Location: 753860
Implantable Pulse Generator Implant Date: 20211122
Lead Channel Impedance Value: 412.5 Ohm
Lead Channel Impedance Value: 475 Ohm
Lead Channel Pacing Threshold Amplitude: 0.75 V
Lead Channel Pacing Threshold Amplitude: 0.75 V
Lead Channel Pacing Threshold Amplitude: 1.25 V
Lead Channel Pacing Threshold Pulse Width: 0.3 ms
Lead Channel Pacing Threshold Pulse Width: 0.3 ms
Lead Channel Pacing Threshold Pulse Width: 0.7 ms
Lead Channel Sensing Intrinsic Amplitude: 3 mV
Lead Channel Sensing Intrinsic Amplitude: 5 mV
Lead Channel Setting Pacing Amplitude: 1.5 V
Lead Channel Setting Pacing Amplitude: 2 V
Lead Channel Setting Pacing Pulse Width: 0.7 ms
Lead Channel Setting Sensing Sensitivity: 0.7 mV
Pulse Gen Model: 2272
Pulse Gen Serial Number: 3864825

## 2024-02-19 NOTE — Telephone Encounter (Signed)
 Need to contact patient's facility to ensure his remote monitor is plugged in and working.   Next remote send is on 03/29/24 - monitor RA lead impedance for beginnings of potential insulation issue and consider programming with polarity switch on in the RA.

## 2024-02-19 NOTE — Telephone Encounter (Signed)
 Attempt to contact patient and patient family to find out name and phone number of facility patient is residing at. Unable to reach patient or patient family after reaching out to all numbers listed. Will continue to attempt to reach patient or family to ensure monitor is connected.

## 2024-02-19 NOTE — Patient Instructions (Signed)
 Medication Instructions:  Your physician recommends that you continue on your current medications as directed. Please refer to the Current Medication list given to you today.  *If you need a refill on your cardiac medications before your next appointment, please call your pharmacy*  Lab Work: None ordered.  If you have labs (blood work) drawn today and your tests are completely normal, you will receive your results only by: MyChart Message (if you have MyChart) OR A paper copy in the mail If you have any lab test that is abnormal or we need to change your treatment, we will call you to review the results.  Testing/Procedures: None ordered.   Follow-Up: At [facility], you and your health needs are our priority.  As part of our continuing mission to provide you with exceptional heart care, our providers are all part of one team.  This team includes your primary Cardiologist (physician) and Advanced Practice Providers or APPs (Physician Assistants and Nurse Practitioners) who all work together to provide you with the care you need, when you need it.  Your next appointment:   Years with Dr Almetta   We recommend signing up for the patient portal called MyChart.  Sign up information is provided on this After Visit Summary.  MyChart is used to connect with patients for Virtual Visits (Telemedicine).  Patients are able to view lab/test results, encounter notes, upcoming appointments, etc.  Non-urgent messages can be sent to your provider as well.   To learn more about what you can do with MyChart, go to [email or web address].

## 2024-02-25 NOTE — Telephone Encounter (Signed)
 Attempt to contact patient and family to find out name of facility where patient is residing. Unable to reach patient or family after calling each number listed. Unable to leave voicemail on patient number. Voicemail left on patient spouse number and patient son number. Voicemail also left on DSS (legal guardian) number as well. Request a call back to device clinic to ensure monitor is connected or receive name of facility patient is residing at.   Will continue to monitor and update accordingly.

## 2024-02-28 NOTE — Progress Notes (Unsigned)
 "  03/01/2024 8:30 AM   Jonathan Poole [birth year] [id number]  Referring provider: [name] [name], MD [address] Jonathan Poole [place],  [place] [address]  No chief complaint on file.   HPI:    PMH: Past Medical History:  Diagnosis Date   Atrial fibrillation (HCC)    Dementia (HCC)    Heart disease    Hyperchloremia    Seizures (HCC)     Surgical History: Past Surgical History:  Procedure Laterality Date   PACEMAKER IMPLANT     PPM GENERATOR CHANGEOUT N/A 01/02/2020   Procedure: PPM GENERATOR CHANGEOUT;  Surgeon: Waddell Danelle ORN, MD;  Location: [facility];  Service: Cardiovascular;  Laterality: N/A;    Home Medications:  Allergies as of 03/01/2024   No Known Allergies      Medication List        Accurate as of February 28, 2024  8:30 AM. If you have any questions, ask your nurse or doctor.          cyanocobalamin  1000 MCG/ML injection Commonly known as: VITAMIN B12 Inject 1,000 mcg into the muscle every 30 (thirty) days.   dabigatran  150 MG Caps capsule Commonly known as: PRADAXA  Take 150 mg by mouth 2 (two) times daily.   donepezil  10 MG tablet Commonly known as: ARICEPT  TAKE 1 TABLET Poole BEDTIME   feeding supplement Liqd Take 237 mLs by mouth 3 (three) times daily between meals.   gabapentin  800 MG tablet Commonly known as: NEURONTIN  Take 800 mg by mouth 4 (four) times daily.   levETIRAcetam  1000 MG tablet Commonly known as: Keppra  Take 1 tablet (1,000 mg total) by mouth 2 (two) times daily.   LORazepam  2 MG/ML concentrated solution Commonly known as: ATIVAN  Take 1 mL (2 mg total) by mouth every 6 (six) hours as needed for anxiety, seizure or sleep.   memantine  10 MG tablet Commonly known as: NAMENDA  Take 10 mg by mouth in the morning and Poole bedtime.   morphine  CONCENTRATE 10 mg / 0.5 ml concentrated solution Take 0.5-1 mLs (10-20 mg total) by mouth every 6 (six) hours as needed for severe pain (pain score 7-10) (shortness of breath).  Can take 20mg  for severe agitation   multivitamin with minerals Tabs tablet Take 1 tablet by mouth daily.   QUEtiapine  25 MG tablet Commonly known as: SEROQUEL  Take 75 mg by mouth 3 (three) times daily.   sennosides-docusate sodium 8.6-50 MG tablet Commonly known as: SENOKOT-S Take 1 tablet by mouth See admin instructions. Three times daily until bowel movement   tamsulosin  0.4 MG Caps capsule Commonly known as: FLOMAX  Take 1 capsule (0.4 mg total) by mouth daily after supper.   Thiamine  Mononitrate 250 MG Tabs Take 500 mg by mouth daily.   Zinc Oxide 12.8 % ointment Commonly known as: TRIPLE PASTE Apply 1 Application topically as needed for irritation (when chaning diaper- or when irritation is bad).        Allergies: Allergies[1]  Family History: Family History  Problem Relation Age of Onset   Heart disease Father    Prostate cancer Paternal Grandfather     Social History:  reports that he quit smoking about 25 years ago. His smoking use included cigarettes. He started smoking about 40 years ago. He has a 15 pack-year smoking history. He has been exposed to tobacco smoke. He has never used smokeless tobacco. He reports that he does not currently use drugs after having used the following drugs: Marijuana. He reports that  he does not drink alcohol .  ROS: All other review of systems were reviewed and are negative except what is noted above in HPI  Physical Exam: There were no vitals taken for this visit.  Constitutional:  Alert and oriented, No acute distress. HEENT: Jonathan Poole, moist mucus membranes.  Trachea midline, no masses. Cardiovascular: No clubbing, cyanosis, or edema. Respiratory: Normal respiratory effort, no increased work of breathing. GI: No inguinal hernias GU: Normal phallus. No masses/lesions on penis, testis, scrotum. Prostate ***g smooth no nodules no induration.  Lymph: No cervical or inguinal lymphadenopathy. Skin: No rashes, bruises or suspicious  lesions. Neurologic: Grossly intact, no focal deficits, moving all 4 extremities. Psychiatric: Normal mood and affect.  Laboratory Data: Lab Results  Component Value Date   WBC 5.6 12/14/2023   HGB 10.6 (L) 12/14/2023   HCT 31.7 (L) 12/14/2023   MCV 88.3 12/14/2023   PLT 171 12/14/2023    Lab Results  Component Value Date   CREATININE 0.92 12/15/2023    No results found for: PSA  No results found for: TESTOSTERONE  Lab Results  Component Value Date   HGBA1C 5.8 (H) 07/13/2020    Urinalysis   Pertinent Imaging: ***  Assessment:    Plan:    There are no diagnoses linked to this encounter.  No follow-ups on file.  Jonathan CHRISTELLA Shack, MD  [facility] [place]      [1] No Known Allergies  "

## 2024-02-29 NOTE — Telephone Encounter (Signed)
 Spoke w/ patient wife, [name], who states patient is currently residing at [facility]. States remote monitor is currently at patient home and not at Rehab facility. Patient wife states she will take bedside monitor to Rehab facility when she visits him next. States it will be within the next few days. Advised to plug monitor in bedroom where patient resides at facility and keep within 8-10 feet of patient when sleeping. Verbalized understanding.   Will continue to monitor for incoming transmission.

## 2024-03-01 ENCOUNTER — Ambulatory Visit: Admitting: Urology

## 2024-03-01 VITALS — BP 94/55 | HR 108

## 2024-03-01 DIAGNOSIS — Z960 Presence of urogenital implants: Secondary | ICD-10-CM

## 2024-03-01 DIAGNOSIS — R339 Retention of urine, unspecified: Secondary | ICD-10-CM

## 2024-03-01 NOTE — Progress Notes (Unsigned)
 Cath Change/ Replacement  Patient is present today for a catheter change due to urinary retention.  10ml of water was removed from the balloon, a 18 FR foley cath was removed without difficulty.  Patient was cleaned and prepped in a sterile fashion with Betadinex3.  A 16 FR foley cath was replaced into the bladder, no complications were noted. Urine return was noted 300 ml and urine was cloudy Dark yellow in color. The balloon was filled with 10ml of sterile water. A 16  bag was attached for drainage.  A night bag was also given to the patient and patient was given instruction on how to change from one bag to another. Patient was given proper instruction on catheter care.    Performed by: Exie DASEN. CMA  Follow up: as scheduled

## 2024-03-17 NOTE — Telephone Encounter (Signed)
 LM for Jonathan Poole (wife). To get update on if she was able to deliver and set up patients [employer] remote monitor at facility yet?  We have not received a transmission.  He will need to be set up with remote appts once we can confirm monitor set up.

## 2024-03-18 NOTE — Telephone Encounter (Signed)
 Attempted outreach to spouse   No answer  Left message to call back
